# Patient Record
Sex: Female | Born: 1935
Health system: Southern US, Community
[De-identification: ages and names within clinical notes are randomized; demographics above are authoritative.]

## PROBLEM LIST (undated history)

## (undated) DIAGNOSIS — F329 Major depressive disorder, single episode, unspecified: Secondary | ICD-10-CM

## (undated) DIAGNOSIS — F4321 Adjustment disorder with depressed mood: Secondary | ICD-10-CM

## (undated) DIAGNOSIS — M1990 Unspecified osteoarthritis, unspecified site: Secondary | ICD-10-CM

## (undated) DIAGNOSIS — T7840XA Allergy, unspecified, initial encounter: Secondary | ICD-10-CM

## (undated) DIAGNOSIS — E785 Hyperlipidemia, unspecified: Secondary | ICD-10-CM

## (undated) DIAGNOSIS — I4891 Unspecified atrial fibrillation: Secondary | ICD-10-CM

## (undated) DIAGNOSIS — I509 Heart failure, unspecified: Secondary | ICD-10-CM

## (undated) DIAGNOSIS — K219 Gastro-esophageal reflux disease without esophagitis: Secondary | ICD-10-CM

## (undated) DIAGNOSIS — J449 Chronic obstructive pulmonary disease, unspecified: Secondary | ICD-10-CM

## (undated) DIAGNOSIS — I219 Acute myocardial infarction, unspecified: Secondary | ICD-10-CM

## (undated) DIAGNOSIS — F0390 Unspecified dementia without behavioral disturbance: Secondary | ICD-10-CM

## (undated) DIAGNOSIS — I2510 Atherosclerotic heart disease of native coronary artery without angina pectoris: Secondary | ICD-10-CM

## (undated) DIAGNOSIS — N289 Disorder of kidney and ureter, unspecified: Secondary | ICD-10-CM

## (undated) DIAGNOSIS — D649 Anemia, unspecified: Secondary | ICD-10-CM

## (undated) DIAGNOSIS — F0280 Dementia in other diseases classified elsewhere without behavioral disturbance: Secondary | ICD-10-CM

## (undated) DIAGNOSIS — I10 Essential (primary) hypertension: Secondary | ICD-10-CM

## (undated) DIAGNOSIS — F32A Depression, unspecified: Secondary | ICD-10-CM

## (undated) DIAGNOSIS — E559 Vitamin D deficiency, unspecified: Secondary | ICD-10-CM

## (undated) HISTORY — DX: Chronic obstructive pulmonary disease, unspecified: J44.9

## (undated) HISTORY — DX: Depression, unspecified: F32.A

## (undated) HISTORY — DX: Unspecified atrial fibrillation: I48.91

## (undated) HISTORY — DX: Allergy, unspecified, initial encounter: T78.40XA

## (undated) HISTORY — DX: Heart failure, unspecified: I50.9

## (undated) HISTORY — PX: ABDOMINAL HYSTERECTOMY: SHX81

## (undated) HISTORY — PX: FOREARM SURGERY: SHX651

## (undated) HISTORY — DX: Acute myocardial infarction, unspecified: I21.9

## (undated) HISTORY — DX: Adjustment disorder with depressed mood: F43.21

## (undated) HISTORY — DX: Vitamin D deficiency, unspecified: E55.9

## (undated) HISTORY — PX: BREAST SURGERY: SHX581

## (undated) HISTORY — DX: Essential (primary) hypertension: I10

## (undated) HISTORY — DX: Hyperlipidemia, unspecified: E78.5

## (undated) HISTORY — DX: Gastro-esophageal reflux disease without esophagitis: K21.9

## (undated) HISTORY — DX: Unspecified osteoarthritis, unspecified site: M19.90

## (undated) HISTORY — DX: Anemia, unspecified: D64.9

---
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
## 2012-12-19 DIAGNOSIS — E782 Mixed hyperlipidemia: Secondary | ICD-10-CM | POA: Insufficient documentation

## 2012-12-19 DIAGNOSIS — M8000XD Age-related osteoporosis with current pathological fracture, unspecified site, subsequent encounter for fracture with routine healing: Secondary | ICD-10-CM | POA: Insufficient documentation

## 2012-12-19 DIAGNOSIS — I10 Essential (primary) hypertension: Secondary | ICD-10-CM | POA: Insufficient documentation

## 2012-12-19 DIAGNOSIS — J449 Chronic obstructive pulmonary disease, unspecified: Secondary | ICD-10-CM | POA: Insufficient documentation

## 2015-06-20 DIAGNOSIS — M25531 Pain in right wrist: Secondary | ICD-10-CM | POA: Insufficient documentation

## 2015-06-20 HISTORY — DX: Pain in right wrist: M25.531

## 2015-11-10 DIAGNOSIS — R0982 Postnasal drip: Secondary | ICD-10-CM | POA: Insufficient documentation

## 2015-11-10 HISTORY — DX: Postnasal drip: R09.82

## 2016-04-15 DIAGNOSIS — I480 Paroxysmal atrial fibrillation: Secondary | ICD-10-CM | POA: Insufficient documentation

## 2016-05-31 DIAGNOSIS — H2589 Other age-related cataract: Secondary | ICD-10-CM | POA: Insufficient documentation

## 2016-09-06 DIAGNOSIS — E876 Hypokalemia: Secondary | ICD-10-CM

## 2016-09-06 HISTORY — DX: Hypokalemia: E87.6

## 2016-09-06 LAB — LIPID PANEL
Cholesterol: 252 — AB (ref 0–200)
HDL: 46 (ref 35–70)
LDL Cholesterol: 168
Triglycerides: 189 — AB (ref 40–160)

## 2018-02-03 LAB — HEMOGLOBIN A1C: Hemoglobin A1C: 5.5

## 2018-03-18 DIAGNOSIS — D509 Iron deficiency anemia, unspecified: Secondary | ICD-10-CM | POA: Insufficient documentation

## 2018-05-03 DIAGNOSIS — R1319 Other dysphagia: Secondary | ICD-10-CM | POA: Insufficient documentation

## 2018-06-16 LAB — CBC AND DIFFERENTIAL
HCT: 42 (ref 36–46)
Hemoglobin: 13.1 (ref 12.0–16.0)
Platelets: 391 (ref 150–399)
WBC: 7.1

## 2018-06-16 LAB — BASIC METABOLIC PANEL
BUN: 10 (ref 4–21)
CO2: 28 — AB (ref 13–22)
Chloride: 98 — AB (ref 99–108)
Creatinine: 0.9 (ref 0.5–1.1)
Glucose: 99
Potassium: 4.3 (ref 3.4–5.3)
Sodium: 136 — AB (ref 137–147)

## 2018-06-16 LAB — IRON,TIBC AND FERRITIN PANEL
Ferritin: 42
Iron: <10

## 2018-06-16 LAB — TSH: TSH: 0.51 (ref 0.41–5.90)

## 2018-06-16 LAB — HEPATIC FUNCTION PANEL
ALT: 18 (ref 7–35)
AST: 31 (ref 13–35)
Alkaline Phosphatase: 93 (ref 25–125)

## 2018-06-16 LAB — COMPREHENSIVE METABOLIC PANEL: GFR calc non Af Amer: 59

## 2018-06-16 LAB — VITAMIN B12: Vitamin B-12: 569

## 2019-06-11 ENCOUNTER — Ambulatory Visit (INDEPENDENT_AMBULATORY_CARE_PROVIDER_SITE_OTHER): Payer: Medicare HMO | Admitting: Internal Medicine

## 2019-06-11 ENCOUNTER — Encounter: Payer: Self-pay | Admitting: Internal Medicine

## 2019-06-11 ENCOUNTER — Other Ambulatory Visit: Payer: Self-pay

## 2019-06-11 VITALS — BP 123/68 | HR 59 | Resp 16 | Ht 60.0 in | Wt 137.0 lb

## 2019-06-11 DIAGNOSIS — J449 Chronic obstructive pulmonary disease, unspecified: Secondary | ICD-10-CM

## 2019-06-11 DIAGNOSIS — R1319 Other dysphagia: Secondary | ICD-10-CM

## 2019-06-11 DIAGNOSIS — H6123 Impacted cerumen, bilateral: Secondary | ICD-10-CM | POA: Diagnosis not present

## 2019-06-11 DIAGNOSIS — M791 Myalgia, unspecified site: Secondary | ICD-10-CM

## 2019-06-11 DIAGNOSIS — I480 Paroxysmal atrial fibrillation: Secondary | ICD-10-CM

## 2019-06-11 DIAGNOSIS — I10 Essential (primary) hypertension: Secondary | ICD-10-CM

## 2019-06-11 DIAGNOSIS — T466X5A Adverse effect of antihyperlipidemic and antiarteriosclerotic drugs, initial encounter: Secondary | ICD-10-CM | POA: Insufficient documentation

## 2019-06-11 DIAGNOSIS — F322 Major depressive disorder, single episode, severe without psychotic features: Secondary | ICD-10-CM

## 2019-06-11 DIAGNOSIS — I509 Heart failure, unspecified: Secondary | ICD-10-CM

## 2019-06-11 DIAGNOSIS — R1310 Dysphagia, unspecified: Secondary | ICD-10-CM

## 2019-06-11 DIAGNOSIS — M810 Age-related osteoporosis without current pathological fracture: Secondary | ICD-10-CM | POA: Insufficient documentation

## 2019-06-11 MED ORDER — OMEPRAZOLE 20 MG PO CPDR: 20.0000 mg | DELAYED_RELEASE_CAPSULE | Freq: Two times a day (BID) | ORAL | 1 refills | Status: DC

## 2019-06-11 NOTE — Patient Instructions (Signed)
You have been referred to [facility] for a cardiology appointment.

## 2019-06-11 NOTE — Progress Notes (Signed)
Date:  06/11/2019   Name:  Jessica Howell   DOB:  09-20-1934   MRN:  [id number]  New patient here to establish care from [facility]. Her today with her daughter Jessica Howell. Chief Complaint: Establish Care  Ear Fullness  There is pain in both ears. This is a recurrent problem. The problem occurs constantly. The problem has been unchanged. There has been no fever. The patient is experiencing no pain. Associated symptoms include abdominal pain (intermittent indigestion), headaches and hearing loss. Pertinent negatives include no rash.  Hypertension This is a chronic problem. The problem is controlled. Associated symptoms include anxiety, headaches, palpitations and shortness of breath. Pertinent negatives include no chest pain, orthopnea, peripheral edema or PND. Past treatments include beta blockers, ACE inhibitors and diuretics. The current treatment provides significant improvement. Hypertensive end-organ damage includes CAD/MI (and AFib).  Gastroesophageal Reflux She complains of abdominal pain (intermittent indigestion), dysphagia (intermittently feels like food is hanging up and sometimes has to regurgitate), heartburn and wheezing. She reports no chest pain, no choking, no globus sensation or no hoarse voice. This is a recurrent problem. The problem occurs occasionally. Pertinent negatives include no fatigue. She has tried a PPI for the symptoms. Past procedures do not include an EGD (unable to undergo EGD due to health status).  Depression        This is a chronic problem.  The most recent episode lasted 50 years.  The problem is unchanged.  Associated symptoms include hopelessness, insomnia, decreased interest, appetite change and headaches.  Associated symptoms include no fatigue and no suicidal ideas.     The symptoms are aggravated by family issues (recent loss of her husband).  Treatments tried: under care of Psychiatry.  Compliance with treatment is good.  Past medical history includes  anxiety.   COPD - she is an ex smoker.  Has copd but needs to use inhalers. She has chronic intermittent cough.  She does not need oxygen.  She is up to date on her immunizations and had had the first covid vaccine. Atrial fibrillation - this is apparently long standing with controlled rate, anticoagulated on Xarelto.  She has no bleeding issues.  She does not want to return to her [facility] Cardiologist due to a negative experience last visit.  Lab Results  Component Value Date   CREATININE 0.9 06/16/2018   BUN 10 06/16/2018   NA 136 (A) 06/16/2018   K 4.3 06/16/2018   CL 98 (A) 06/16/2018   CO2 28 (A) 06/16/2018   Lab Results  Component Value Date   CHOL 252 (A) 09/06/2016   HDL 46 09/06/2016   LDLCALC 168 09/06/2016   TRIG 189 (A) 09/06/2016   Lab Results  Component Value Date   TSH 0.51 06/16/2018   Lab Results  Component Value Date   HGBA1C 5.5 02/03/2018     Review of Systems  Constitutional: Positive for appetite change and unexpected weight change (30 lbs in the past 6 mo since husband died). Negative for chills, fatigue and fever.  HENT: Positive for hearing loss. Negative for ear pain, hoarse voice, sinus pressure and trouble swallowing.   Eyes: Negative for visual disturbance.  Respiratory: Positive for chest tightness, shortness of breath and wheezing. Negative for choking.   Cardiovascular: Positive for palpitations. Negative for chest pain, orthopnea, leg swelling and PND.  Gastrointestinal: Positive for abdominal pain (intermittent indigestion), dysphagia (intermittently feels like food is hanging up and sometimes has to regurgitate) and heartburn. Negative for anal bleeding.  Genitourinary: Positive for frequency and urgency.  Skin: Negative for color change and rash.  Allergic/Immunologic: Negative for environmental allergies.  Neurological: Positive for headaches. Negative for dizziness and light-headedness.  Psychiatric/Behavioral: Positive for depression and  dysphoric mood. Negative for sleep disturbance and suicidal ideas. The patient is nervous/anxious and has insomnia.     Patient Active Problem List   Diagnosis Date Noted  . Senile osteoporosis 06/11/2019  . Esophageal dysphagia 05/03/2018  . Iron deficiency anemia 03/18/2018  . Other age-related cataract 05/31/2016  . Paroxysmal atrial fibrillation ([place]) 04/15/2016  . CHF (congestive heart failure) ([place]) 10/16/2013  . Other seborrheic keratosis 01/03/2013  . Coronary atherosclerosis of native coronary artery 01/03/2013  . Vitamin D deficiency 12/19/2012  . Mixed hyperlipidemia 12/19/2012  . Essential (primary) hypertension 12/19/2012  . Chronic obstructive pulmonary disease, unspecified ([place]) 12/19/2012    Allergies  Allergen Reactions  . Penicillins Anaphylaxis, Rash and Other (See Comments)  . Azithromycin Other (See Comments)    abd pain   . Gemfibrozil Nausea And Vomiting and Rash       . Solifenacin     Mild urinary retention  . Statins Nausea And Vomiting and Rash    Stomach pain      Past Surgical History:  Procedure Laterality Date  . ABDOMINAL HYSTERECTOMY    . BREAST SURGERY    . FOREARM SURGERY      Social History   Tobacco Use  . Smoking status: Former Smoker    Packs/day: 0.50    Years: 45.00    Pack years: 22.50    Types: Cigarettes    Quit date: 06/10/2004    Years since quitting: 15.0  . Smokeless tobacco: Never Used  Substance Use Topics  . Alcohol use: Not Currently  . Drug use: Not Currently     Medication list has been reviewed and updated.  Current Meds  Medication Sig  . albuterol (VENTOLIN HFA) 108 (90 Base) MCG/ACT inhaler ventolin hfa 108 (90 base) mcg/actaers  . ALPRAZolam (XANAX) 0.5 MG tablet Take 0.5 mg by mouth 3 (three) times daily as needed.   . Cholecalciferol (VITAMIN D) 125 MCG (5000 UT) CAPS Take by mouth.  . cloNIDine (CATAPRES - DOSED IN MG/24 HR) 0.2 mg/24hr patch Place 0.2 mg onto the skin once a week.  .  Fluticasone-Salmeterol (ADVAIR DISKUS) 250-50 MCG/DOSE AEPB Inhale into the lungs.  . furosemide (LASIX) 20 MG tablet 40 mg.   . Melatonin 5 MG CHEW Chew 10 mg by mouth.   . Metoprolol Succinate 200 MG CS24 Take 200 mg by mouth daily.  . montelukast (SINGULAIR) 10 MG tablet Take by mouth at bedtime.   . nitroGLYCERIN (NITROSTAT) 0.4 MG SL tablet Nitrostat 0.4 mg sublingual tablet  PLACE 1 T UNDER THE TONGUE Q 5 MINUTES PRF CHEST PAIN  . omeprazole (PRILOSEC) 20 MG capsule omeprazole 20 mg capsule,delayed release  TK ONE C PO QD  . potassium chloride (KLOR-CON) 10 MEQ tablet   . ramipril (ALTACE) 10 MG capsule Take 10 mg by mouth 2 (two) times daily.   . Rivaroxaban (XARELTO) 15 MG TABS tablet Take by mouth.  . temazepam (RESTORIL) 30 MG capsule temazepam 30 mg caps  . traMADol (ULTRAM) 50 MG tablet Take by mouth every 6 (six) hours as needed.  . vitamin E 180 MG (400 UNITS) capsule Take by mouth.  . [DISCONTINUED] aspirin 81 MG EC tablet Take by mouth.  . [DISCONTINUED] Cholecalciferol 25 MCG (1000 UT) capsule Take by mouth.  . [  DISCONTINUED] diltiazem (TIAZAC) 240 MG 24 hr capsule Take by mouth.  . [DISCONTINUED] Fluticasone Furoate (ARNUITY ELLIPTA) 100 MCG/ACT AEPB   . [DISCONTINUED] metoprolol (TOPROL-XL) 200 MG 24 hr tablet Take by mouth.  . [DISCONTINUED] NIFEdipine (PROCARDIA XL/NIFEDICAL XL) 60 MG 24 hr tablet Nifedical XL 60 mg tablet,extended release  . [DISCONTINUED] tobramycin-dexamethasone (TOBRADEX) ophthalmic solution Apply to eye.    PHQ 2/9 Scores 06/11/2019  PHQ - 2 Score 6  PHQ- 9 Score 23    BP Readings from Last 3 Encounters:  06/11/19 123/68    Physical Exam Vitals and nursing note reviewed.  Constitutional:      General: She is not in acute distress.    Appearance: Normal appearance. She is well-developed.  HENT:     Head: Normocephalic and atraumatic.     Right Ear: Tympanic membrane and ear canal normal. Decreased hearing noted. There is impacted  cerumen.     Left Ear: Tympanic membrane and ear canal normal. Decreased hearing noted. There is impacted cerumen.     Ears:     Comments: Cerumen was removed from both ear canals using a curette.  Pt tolerated procedure well.  Afterwards, both canals and drums appear normal. Neck:     Vascular: No carotid bruit or JVD.  Cardiovascular:     Rate and Rhythm: Normal rate. Rhythm irregular.     Pulses: Normal pulses.     Heart sounds: No murmur.  Pulmonary:     Effort: Pulmonary effort is normal. No respiratory distress.     Breath sounds: No wheezing or rhonchi.  Abdominal:     General: Abdomen is flat.     Palpations: Abdomen is soft.     Tenderness: There is no abdominal tenderness.  Musculoskeletal:        General: Normal range of motion.     Cervical back: Normal range of motion.     Right lower leg: No edema.     Left lower leg: No edema.  Lymphadenopathy:     Cervical: No cervical adenopathy.  Skin:    General: Skin is warm and dry.     Capillary Refill: Capillary refill takes less than 2 seconds.     Findings: No rash.  Neurological:     General: No focal deficit present.     Mental Status: She is alert and oriented to person, place, and time.  Psychiatric:        Attention and Perception: Attention normal.        Mood and Affect: Mood is depressed. Affect is not flat or tearful.        Speech: Speech normal.        Behavior: Behavior normal.        Thought Content: Thought content includes suicidal (has negative thoughts - under Psych care monthly for many years) ideation. Thought content does not include suicidal plan.        Cognition and Memory: Cognition normal.     Wt Readings from Last 3 Encounters:  06/11/19 137 lb (62.1 kg)    BP 123/68   Pulse (!) 59   Resp 16   Ht 5' (1.524 m)   Wt 137 lb (62.1 kg)   SpO2 97%   BMI 26.76 kg/m   Assessment and Plan: 1. Essential (primary) hypertension Clinically stable exam with well controlled BP on lisinopril,  metoprolol and lasix. Tolerating medications without side effects at this time. Pt to continue current regimen and low sodium diet; benefits of regular  exercise as able discussed. - TSH  2. Paroxysmal atrial fibrillation (HCC) Rate controlled on metoprolol, anticoagulated on Xarelto Will refer to a [facility] - CBC with Differential/Platelet - Comprehensive metabolic panel - Ambulatory referral to Cardiology  3. Congestive heart failure, unspecified HF chronicity, unspecified heart failure type [facility]) She appears eu-volemic today.  No PND or edema. No JVD on exam. - Ambulatory referral to Cardiology  4. Chronic obstructive pulmonary disease, unspecified COPD type ([place]) Hx of smoking - quite 15+ years ago Symptoms are well controlled on Advair bid and albuterol PRN  5. Esophageal dysphagia Unable to tolerated EGD for further workup.  Dysphagia sx are stable and intermittent but she still has some reflux symptoms Will increase to omeprazole BID - omeprazole (PRILOSEC) 20 MG capsule; Take 1 capsule (20 mg total) by mouth 2 (two) times daily before a meal.  Dispense: 180 capsule; Refill: 1  6. Myalgia due to statin Patient has not tolerated statins in the past due to myalgias  7. Impacted cerumen of both ears Moderated amount of cerumen was removed from both ears with a curette.  Pt believes that her hearing has improved slightly. Still appears to have reduced acuity.    8. Current severe episode of major depressive disorder without psychotic features, unspecified whether recurrent ([place]) Has been under psych care for many years Currently seeing her provider monthly On Tramadol, xanax and Temazepam   Partially dictated using Editor, commissioning. Any errors are unintentional.  Halina Maidens, MD [facility]  06/11/2019

## 2019-06-12 LAB — COMPREHENSIVE METABOLIC PANEL
ALT: 17 IU/L (ref 0–32)
AST: 24 IU/L (ref 0–40)
Albumin/Globulin Ratio: 1.6 (ref 1.2–2.2)
Albumin: 4.6 g/dL (ref 3.6–4.6)
Alkaline Phosphatase: 100 IU/L (ref 39–117)
BUN/Creatinine Ratio: 15 (ref 12–28)
BUN: 19 mg/dL (ref 8–27)
Bilirubin Total: 0.4 mg/dL (ref 0.0–1.2)
CO2: 25 mmol/L (ref 20–29)
Calcium: 10.8 mg/dL — ABNORMAL HIGH (ref 8.7–10.3)
Chloride: 95 mmol/L — ABNORMAL LOW (ref 96–106)
Creatinine, Ser: 1.23 mg/dL — ABNORMAL HIGH (ref 0.57–1.00)
GFR calc Af Amer: 47 mL/min/{1.73_m2} — ABNORMAL LOW (ref >59)
GFR calc non Af Amer: 41 mL/min/{1.73_m2} — ABNORMAL LOW (ref >59)
Globulin, Total: 2.8 g/dL (ref 1.5–4.5)
Glucose: 95 mg/dL (ref 65–99)
Potassium: 4.3 mmol/L (ref 3.5–5.2)
Sodium: 134 mmol/L (ref 134–144)
Total Protein: 7.4 g/dL (ref 6.0–8.5)

## 2019-06-12 LAB — TSH: TSH: 2.74 u[IU]/mL (ref 0.450–4.500)

## 2019-06-12 LAB — CBC WITH DIFFERENTIAL/PLATELET
Basophils Absolute: 0.1 10*3/uL (ref 0.0–0.2)
Basos: 1 %
EOS (ABSOLUTE): 0 10*3/uL (ref 0.0–0.4)
Eos: 0 %
Hematocrit: 43.9 % (ref 34.0–46.6)
Hemoglobin: 14.6 g/dL (ref 11.1–15.9)
Immature Grans (Abs): 0 10*3/uL (ref 0.0–0.1)
Immature Granulocytes: 0 %
Lymphocytes Absolute: 2.6 10*3/uL (ref 0.7–3.1)
Lymphs: 33 %
MCH: 27 pg (ref 26.6–33.0)
MCHC: 33.3 g/dL (ref 31.5–35.7)
MCV: 81 fL (ref 79–97)
Monocytes Absolute: 0.9 10*3/uL (ref 0.1–0.9)
Monocytes: 11 %
Neutrophils Absolute: 4.3 10*3/uL (ref 1.4–7.0)
Neutrophils: 55 %
Platelets: 149 10*3/uL — ABNORMAL LOW (ref 150–450)
RBC: 5.4 x10E6/uL — ABNORMAL HIGH (ref 3.77–5.28)
RDW: 13.1 % (ref 11.7–15.4)
WBC: 7.9 10*3/uL (ref 3.4–10.8)

## 2019-06-18 ENCOUNTER — Ambulatory Visit: Payer: Medicare HMO | Admitting: Cardiology

## 2019-06-20 ENCOUNTER — Encounter: Payer: Self-pay | Admitting: Cardiology

## 2019-07-10 ENCOUNTER — Telehealth: Payer: Self-pay | Admitting: Cardiology

## 2019-07-10 NOTE — Telephone Encounter (Signed)
Patient mailed in a check for a no show fee for $50.00. Patient was scheduled as a New patient and we do not charge a fee for those missed appointments. Patient called and made aware. Patient asked that I mail back her check   Patient will check with her daughter and will call back to reschedule new patient appointment

## 2019-08-30 ENCOUNTER — Other Ambulatory Visit: Payer: Self-pay | Admitting: Internal Medicine

## 2019-08-30 DIAGNOSIS — R1319 Other dysphagia: Secondary | ICD-10-CM

## 2019-08-30 DIAGNOSIS — R1310 Dysphagia, unspecified: Secondary | ICD-10-CM

## 2019-08-30 NOTE — Telephone Encounter (Signed)
Requested Prescriptions  Pending Prescriptions Disp Refills  . omeprazole (PRILOSEC) 20 MG capsule [Pharmacy Med Name: OMEPRAZOLE 20 MG Capsule Delayed Release] 180 capsule 1    Sig: TAKE 1 CAPSULE (20 MG TOTAL) BY MOUTH 2 (TWO) TIMES DAILY BEFORE A MEAL.     Gastroenterology: Proton Pump Inhibitors Passed - 08/30/2019  4:46 PM      Passed - Valid encounter within last 12 months    Recent Outpatient Visits          2 months ago Essential (primary) hypertension   [facility] Glean Hess, MD      Future Appointments            In 1 month Army Melia, Jesse Sans, MD [facility], [facility]

## 2019-09-17 ENCOUNTER — Ambulatory Visit: Payer: Medicare HMO

## 2019-09-26 ENCOUNTER — Ambulatory Visit (INDEPENDENT_AMBULATORY_CARE_PROVIDER_SITE_OTHER): Payer: Medicare HMO

## 2019-09-26 DIAGNOSIS — Z0000 Encounter for general adult medical examination without abnormal findings: Secondary | ICD-10-CM

## 2019-09-26 NOTE — Progress Notes (Signed)
Subjective:   Jessica Howell is a 85 y.o. female who presents for an Initial Medicare Annual Wellness Visit.  Virtual Visit via Telephone Note  I connected with  Jessica Howell on 09/26/19 at  3:20 PM EDT by telephone and verified that I am speaking with the correct person using two identifiers.  Medicare Annual Wellness visit completed telephonically due to Covid-19 pandemic.   Location: Patient: home Provider: office   I discussed the limitations, risks, security and privacy concerns of performing an evaluation and management service by telephone and the availability of in person appointments. The patient expressed understanding and agreed to proceed.  Unable to perform video visit due to patient does not have video capability.   Some vital signs may be absent or patient reported.   Clemetine Marker, LPN    Review of Systems      Cardiac Risk Factors include: advanced age (>71men, >75 women);hypertension;sedentary lifestyle     Objective:    There were no vitals filed for this visit. There is no height or weight on file to calculate BMI.  Advanced Directives 09/26/2019  Does Patient Have a Medical Advance Directive? Yes  Type of Paramedic of [place];Living will  Copy of [place] in Chart? Yes - validated most recent copy scanned in chart (See row information)    Current Medications (verified) Outpatient Encounter Medications as of 09/26/2019  Medication Sig  . albuterol (VENTOLIN HFA) 108 (90 Base) MCG/ACT inhaler ventolin hfa 108 (90 base) mcg/actaers  . ALPRAZolam (XANAX) 0.5 MG tablet Take 0.5 mg by mouth 3 (three) times daily as needed.   . Cholecalciferol (VITAMIN D) 125 MCG (5000 UT) CAPS Take by mouth.  . furosemide (LASIX) 40 MG tablet Take 1 tablet by mouth in the morning and at bedtime.  . Melatonin 5 MG CHEW Chew 10 mg by mouth.   . Metoprolol Succinate 200 MG CS24 Take 200 mg by mouth daily.  . montelukast  (SINGULAIR) 10 MG tablet Take by mouth at bedtime.   . nitroGLYCERIN (NITROSTAT) 0.4 MG SL tablet Nitrostat 0.4 mg sublingual tablet  PLACE 1 T UNDER THE TONGUE Q 5 MINUTES PRF CHEST PAIN  . omeprazole (PRILOSEC) 20 MG capsule TAKE 1 CAPSULE (20 MG TOTAL) BY MOUTH 2 (TWO) TIMES DAILY BEFORE A MEAL.  Marland Kitchen potassium chloride (KLOR-CON) 10 MEQ tablet   . ramipril (ALTACE) 10 MG capsule Take 10 mg by mouth 2 (two) times daily.   . temazepam (RESTORIL) 30 MG capsule temazepam 30 mg caps  . traMADol (ULTRAM) 50 MG tablet Take by mouth every 6 (six) hours as needed.  . vitamin E 180 MG (400 UNITS) capsule Take by mouth.  . Fluticasone-Salmeterol (ADVAIR DISKUS) 250-50 MCG/DOSE AEPB Inhale into the lungs.  . Rivaroxaban (XARELTO) 15 MG TABS tablet Take by mouth.  . [DISCONTINUED] cloNIDine (CATAPRES - DOSED IN MG/24 HR) 0.2 mg/24hr patch Place 0.2 mg onto the skin once a week.  . [DISCONTINUED] furosemide (LASIX) 20 MG tablet 40 mg.    No facility-administered encounter medications on file as of 09/26/2019.    Allergies (verified) Penicillins, Azithromycin, Gemfibrozil, Solifenacin, and Statins   History: Past Medical History:  Diagnosis Date  . A-fib ([place])   . Allergy   . CHF (congestive heart failure) ([place])   . COPD (chronic obstructive pulmonary disease) ([place])   . Depression   . Feeling grief   . GERD (gastroesophageal reflux disease)   . Hyperlipidemia   . Hypertension   .  Osteoarthritis   . Vitamin D deficiency    Past Surgical History:  Procedure Laterality Date  . ABDOMINAL HYSTERECTOMY    . BREAST SURGERY    . FOREARM SURGERY     Family History  Problem Relation Age of Onset  . Diabetes Mother   . Hypertension Mother   . Cancer Father        esoph.   Marland Kitchen COPD Sister   . Heart disease Brother   . Early death Son    Social History   Socioeconomic History  . Marital status: Widowed    Spouse name: Not on file  . Number of children: 1  . Years of education: Not on file    . Highest education level: Not on file  Occupational History  . Occupation: retired  Tobacco Use  . Smoking status: Former Smoker    Packs/day: 0.50    Years: 45.00    Pack years: 22.50    Types: Cigarettes    Quit date: 06/10/2004    Years since quitting: 15.3  . Smokeless tobacco: Never Used  Substance and Sexual Activity  . Alcohol use: Not Currently  . Drug use: Not Currently  . Sexual activity: Not on file  Other Topics Concern  . Not on file  Social History Narrative   Living alone. Husband passed away in 11-25-2018. Daughter checking in on her and great grandson checks in also.    Social Determinants of Health   Financial Resource Strain: Low Risk   . Difficulty of Paying Living Expenses: Not very hard  Food Insecurity: No Food Insecurity  . Worried About Charity fundraiser in the Last Year: Never true  . Ran Out of Food in the Last Year: Never true  Transportation Needs: No Transportation Needs  . Lack of Transportation (Medical): No  . Lack of Transportation (Non-Medical): No  Physical Activity: Inactive  . Days of Exercise per Week: 0 days  . Minutes of Exercise per Session: 0 min  Stress: Stress Concern Present  . Feeling of Stress : Very much  Social Connections: Unknown  . Frequency of Communication with Friends and Family: Patient refused  . Frequency of Social Gatherings with Friends and Family: Patient refused  . Attends Religious Services: Patient refused  . Active Member of Clubs or Organizations: Patient refused  . Attends Archivist Meetings: Patient refused  . Marital Status: Widowed    Tobacco Counseling Counseling given: Not Answered   Clinical Intake:  Pre-visit preparation completed: Yes  Pain : No/denies pain     Nutritional Risks: None Diabetes: No  How often do you need to have someone help you when you read instructions, pamphlets, or other written materials from your doctor or pharmacy?: 1 - Never  Interpreter  Needed?: No  Information entered by :: Clemetine Marker LPN   Activities of Daily Living In your present state of health, do you have any difficulty performing the following activities: 09/26/2019 06/11/2019  Hearing? Y Y  Comment plans to get hearing assessment -  Vision? Y N  Difficulty concentrating or making decisions? Tempie Donning  Walking or climbing stairs? N Y  Dressing or bathing? N N  Doing errands, shopping? N Y  Conservation officer, nature and eating ? N -  Using the Toilet? N -  In the past six months, have you accidently leaked urine? N -  Do you have problems with loss of bowel control? N -  Managing your Medications? N -  Managing  your Finances? N -  Housekeeping or managing your Housekeeping? N -     Immunizations and Health Maintenance Immunization History  Administered Date(s) Administered  . Influenza-Unspecified 02/16/2017, 01/10/2019  . Moderna SARS-COVID-2 Vaccination 06/06/2019, 07/04/2019  . Pneumococcal Conjugate-13 09/07/2013  . Pneumococcal Polysaccharide-23 03/04/1999, 12/14/2010  . Tdap 12/14/2010   There are no preventive care reminders to display for this patient.  Patient Care Team: Glean Hess, MD as PCP - General (Internal Medicine) Dr. Pasty Arch (Psychiatry)  Indicate any recent Medical Services you may have received from other than [facility] providers in the past year (date may be approximate).     Assessment:   This is a routine wellness examination for [place].  Hearing/Vision screen  Hearing Screening   125Hz  250Hz  500Hz  1000Hz  2000Hz  3000Hz  4000Hz  6000Hz  8000Hz   Right ear:           Left ear:           Comments: Pt c/o difficulty hearing, plans to get hearing evaluation to assess for hearing aids   Vision Screening Comments: Past due for eye exam  Dietary issues and exercise activities discussed: Current Exercise Habits: The patient does not participate in regular exercise at present, Exercise limited by: respiratory conditions(s)  Goals    None    Depression Screen PHQ 2/9 Scores 09/26/2019 06/11/2019  PHQ - 2 Score 6 6  PHQ- 9 Score 18 23    Fall Risk Fall Risk  09/26/2019 06/11/2019  Falls in the past year? 1 1  Number falls in past yr: 1 1  Injury with Fall? 1 0  Risk for fall due to : History of fall(s) History of fall(s)  Follow up Falls prevention discussed Falls evaluation completed    [place]:  Any stairs in or around the home? Yes  If so, are there any without handrails? Yes   Home free of loose throw rugs in walkways, pet beds, electrical cords, etc? Yes  Adequate lighting in your home to reduce risk of falls? Yes   ASSISTIVE DEVICES UTILIZED TO PREVENT FALLS:   Life alert? Yes  Use of a cane, walker or w/c? Yes  Grab bars in the bathroom? Yes  Shower chair or bench in shower? No  Elevated toilet seat or a handicapped toilet? Yes   DME ORDERS:  DME order needed?  No   TIMED UP AND GO:  Was the test performed? No . telephonic visit  Education: Fall risk prevention has been discussed.  Intervention(s) required? No    DME/home health order needed?  No   Cognitive Function:        Screening Tests Health Maintenance  Topic Date Due  . DEXA SCAN  04/26/2020 (Originally 04/02/2002)  . INFLUENZA VACCINE  11/25/2019  . TETANUS/TDAP  12/13/2020  . COVID-19 Vaccine  Completed  . PNA vac Low Risk Adult  Completed    Qualifies for Shingles Vaccine? Yes . Due for Shingrix. Education has been provided regarding the importance of this vaccine. Pt has been advised to call insurance company to determine out of pocket expense. Advised may also receive vaccine at local pharmacy or Health Dept. Verbalized acceptance and understanding.  Tdap: Up to date  Flu Vaccine: Up to date  Pneumococcal Vaccine: Up to date  Covid-19 Vaccine: Up to date   Cancer Screenings:  Colorectal Screening: Not completed .No longer required.   Mammogram:  No longer required.    Bone Density: No longer required  Lung Cancer Screening: (Low Dose CT Chest recommended if Age 22-80 years, 30 pack-year currently smoking OR have quit w/in 15years.) does not qualify.   Additional Screening:  Hepatitis C Screening: no longer required  Vision Screening: Recommended annual ophthalmology exams for early detection of glaucoma and other disorders of the eye. Is the patient up to date with their annual eye exam?  No  Who is the provider or what is the name of the office in which the pt attends annual eye exams? Not established  Dental Screening: Recommended annual dental exams for proper oral hygiene  Community Resource Referral:  CRR required this visit?  No      Plan:    I have personally reviewed and addressed the Medicare Annual Wellness questionnaire and have noted the following in the patient's chart:  A. Medical and social history B. Use of alcohol, tobacco or illicit drugs  C. Current medications and supplements D. Functional ability and status E.  Nutritional status F.  Physical activity G. Advance directives H. List of other physicians I.  Hospitalizations, surgeries, and ER visits in previous 12 months J.  [place] such as hearing and vision if needed, cognitive and depression L. Referrals and appointments   In addition, I have reviewed and discussed with patient certain preventive protocols, quality metrics, and best practice recommendations. A written personalized care plan for preventive services as well as general preventive health recommendations were provided to patient.   Signed,  Clemetine Marker, LPN Nurse Health Advisor   Nurse Notes: none

## 2019-09-26 NOTE — Patient Instructions (Signed)
Ms. Jessica Howell , Thank you for taking time to come for your Medicare Wellness Visit. I appreciate your ongoing commitment to your health goals. Please review the following plan we discussed and let me know if I can assist you in the future.   Screening recommendations/referrals: Colonoscopy: no longer required Mammogram: no longer required Bone Density: no longer required Recommended yearly ophthalmology/optometry visit for glaucoma screening and checkup Recommended yearly dental visit for hygiene and checkup  Vaccinations: Influenza vaccine: done 01/10/19 Pneumococcal vaccine: done 09/07/13 Tdap vaccine: done 12/14/10 Shingles vaccine: Shingrix discussed. Please contact your pharmacy for coverage information.  Covid-19:done 06/06/19 & 07/04/19  Conditions/risks identified: recommend eating 3 healthy meals per day  Next appointment: Follow up in one year for your annual wellness visit    Preventive Care 65 Years and Older, Female Preventive care refers to lifestyle choices and visits with your health care provider that can promote health and wellness. What does preventive care include?  A yearly physical exam. This is also called an annual well check.  Dental exams once or twice a year.  Routine eye exams. Ask your health care provider how often you should have your eyes checked.  Personal lifestyle choices, including:  Daily care of your teeth and gums.  Regular physical activity.  Eating a healthy diet.  Avoiding tobacco and drug use.  Limiting alcohol use.  Practicing safe sex.  Taking low-dose aspirin every day.  Taking vitamin and mineral supplements as recommended by your health care provider. What happens during an annual well check? The services and screenings done by your health care provider during your annual well check will depend on your age, overall health, lifestyle risk factors, and family history of disease. Counseling  Your health care provider may ask you  questions about your:  Alcohol use.  Tobacco use.  Drug use.  Emotional well-being.  Home and relationship well-being.  Sexual activity.  Eating habits.  History of falls.  Memory and ability to understand (cognition).  Work and work Statistician.  Reproductive health. Screening  You may have the following tests or measurements:  Height, weight, and BMI.  Blood pressure.  Lipid and cholesterol levels. These may be checked every 5 years, or more frequently if you are over 89 years old.  Skin check.  Lung cancer screening. You may have this screening every year starting at age 106 if you have a 30-pack-year history of smoking and currently smoke or have quit within the past 15 years.  Fecal occult blood test (FOBT) of the stool. You may have this test every year starting at age 67.  Flexible sigmoidoscopy or colonoscopy. You may have a sigmoidoscopy every 5 years or a colonoscopy every 10 years starting at age 69.  Hepatitis C blood test.  Hepatitis B blood test.  Sexually transmitted disease (STD) testing.  Diabetes screening. This is done by checking your blood sugar (glucose) after you have not eaten for a while (fasting). You may have this done every 1-3 years.  Bone density scan. This is done to screen for osteoporosis. You may have this done starting at age 53.  Mammogram. This may be done every 1-2 years. Talk to your health care provider about how often you should have regular mammograms. Talk with your health care provider about your test results, treatment options, and if necessary, the need for more tests. Vaccines  Your health care provider may recommend certain vaccines, such as:  Influenza vaccine. This is recommended every year.  Tetanus, diphtheria,  and acellular pertussis (Tdap, Td) vaccine. You may need a Td booster every 10 years.  Zoster vaccine. You may need this after age 34.  Pneumococcal 13-valent conjugate (PCV13) vaccine. One dose is  recommended after age 61.  Pneumococcal polysaccharide (PPSV23) vaccine. One dose is recommended after age 37. Talk to your health care provider about which screenings and vaccines you need and how often you need them. This information is not intended to replace advice given to you by your health care provider. Make sure you discuss any questions you have with your health care provider. Document Released: 05/09/2015 Document Revised: 12/31/2015 Document Reviewed: 02/11/2015 Elsevier Interactive Patient Education  2017 [place] Prevention in the Home Falls can cause injuries. They can happen to people of all ages. There are many things you can do to make your home safe and to help prevent falls. What can I do on the outside of my home?  Regularly fix the edges of walkways and driveways and fix any cracks.  Remove anything that might make you trip as you walk through a door, such as a raised step or threshold.  Trim any bushes or trees on the path to your home.  Use bright outdoor lighting.  Clear any walking paths of anything that might make someone trip, such as rocks or tools.  Regularly check to see if handrails are loose or broken. Make sure that both sides of any steps have handrails.  Any raised decks and porches should have guardrails on the edges.  Have any leaves, snow, or ice cleared regularly.  Use sand or salt on walking paths during winter.  Clean up any spills in your garage right away. This includes oil or grease spills. What can I do in the bathroom?  Use night lights.  Install grab bars by the toilet and in the tub and shower. Do not use towel bars as grab bars.  Use non-skid mats or decals in the tub or shower.  If you need to sit down in the shower, use a plastic, non-slip stool.  Keep the floor dry. Clean up any water that spills on the floor as soon as it happens.  Remove soap buildup in the tub or shower regularly.  Attach bath mats  securely with double-sided non-slip rug tape.  Do not have throw rugs and other things on the floor that can make you trip. What can I do in the bedroom?  Use night lights.  Make sure that you have a light by your bed that is easy to reach.  Do not use any sheets or blankets that are too big for your bed. They should not hang down onto the floor.  Have a firm chair that has side arms. You can use this for support while you get dressed.  Do not have throw rugs and other things on the floor that can make you trip. What can I do in the kitchen?  Clean up any spills right away.  Avoid walking on wet floors.  Keep items that you use a lot in easy-to-reach places.  If you need to reach something above you, use a strong step stool that has a grab bar.  Keep electrical cords out of the way.  Do not use floor polish or wax that makes floors slippery. If you must use wax, use non-skid floor wax.  Do not have throw rugs and other things on the floor that can make you trip. What can I do with my  stairs?  Do not leave any items on the stairs.  Make sure that there are handrails on both sides of the stairs and use them. Fix handrails that are broken or loose. Make sure that handrails are as long as the stairways.  Check any carpeting to make sure that it is firmly attached to the stairs. Fix any carpet that is loose or worn.  Avoid having throw rugs at the top or bottom of the stairs. If you do have throw rugs, attach them to the floor with carpet tape.  Make sure that you have a light switch at the top of the stairs and the bottom of the stairs. If you do not have them, ask someone to add them for you. What else can I do to help prevent falls?  Wear shoes that:  Do not have high heels.  Have rubber bottoms.  Are comfortable and fit you well.  Are closed at the toe. Do not wear sandals.  If you use a stepladder:  Make sure that it is fully opened. Do not climb a closed  stepladder.  Make sure that both sides of the stepladder are locked into place.  Ask someone to hold it for you, if possible.  Clearly mark and make sure that you can see:  Any grab bars or handrails.  First and last steps.  Where the edge of each step is.  Use tools that help you move around (mobility aids) if they are needed. These include:  Canes.  Walkers.  Scooters.  Crutches.  Turn on the lights when you go into a dark area. Replace any light bulbs as soon as they burn out.  Set up your furniture so you have a clear path. Avoid moving your furniture around.  If any of your floors are uneven, fix them.  If there are any pets around you, be aware of where they are.  Review your medicines with your doctor. Some medicines can make you feel dizzy. This can increase your chance of falling. Ask your doctor what other things that you can do to help prevent falls. This information is not intended to replace advice given to you by your health care provider. Make sure you discuss any questions you have with your health care provider. Document Released: 02/06/2009 Document Revised: 09/18/2015 Document Reviewed: 05/17/2014 Elsevier Interactive Patient Education  2017 [employer].

## 2019-10-09 ENCOUNTER — Encounter: Payer: Self-pay | Admitting: Internal Medicine

## 2019-10-09 ENCOUNTER — Ambulatory Visit (INDEPENDENT_AMBULATORY_CARE_PROVIDER_SITE_OTHER): Payer: Medicare HMO | Admitting: Internal Medicine

## 2019-10-09 ENCOUNTER — Other Ambulatory Visit: Payer: Self-pay

## 2019-10-09 VITALS — BP 136/84 | HR 73 | Temp 98.0°F | Ht 60.0 in | Wt 140.0 lb

## 2019-10-09 DIAGNOSIS — E559 Vitamin D deficiency, unspecified: Secondary | ICD-10-CM | POA: Diagnosis not present

## 2019-10-09 DIAGNOSIS — D6869 Other thrombophilia: Secondary | ICD-10-CM | POA: Insufficient documentation

## 2019-10-09 DIAGNOSIS — Z0000 Encounter for general adult medical examination without abnormal findings: Secondary | ICD-10-CM

## 2019-10-09 DIAGNOSIS — I10 Essential (primary) hypertension: Secondary | ICD-10-CM

## 2019-10-09 DIAGNOSIS — J449 Chronic obstructive pulmonary disease, unspecified: Secondary | ICD-10-CM | POA: Diagnosis not present

## 2019-10-09 DIAGNOSIS — I480 Paroxysmal atrial fibrillation: Secondary | ICD-10-CM

## 2019-10-09 DIAGNOSIS — F322 Major depressive disorder, single episode, severe without psychotic features: Secondary | ICD-10-CM

## 2019-10-09 DIAGNOSIS — I700 Atherosclerosis of aorta: Secondary | ICD-10-CM

## 2019-10-09 DIAGNOSIS — R1319 Other dysphagia: Secondary | ICD-10-CM

## 2019-10-09 DIAGNOSIS — R1310 Dysphagia, unspecified: Secondary | ICD-10-CM

## 2019-10-09 DIAGNOSIS — H9193 Unspecified hearing loss, bilateral: Secondary | ICD-10-CM

## 2019-10-09 LAB — POCT URINALYSIS DIPSTICK
Bilirubin, UA: NEGATIVE
Blood, UA: NEGATIVE
Glucose, UA: NEGATIVE
Ketones, UA: NEGATIVE
Nitrite, UA: NEGATIVE
Protein, UA: NEGATIVE
Spec Grav, UA: <=1.005 — AB (ref 1.010–1.025)
Urobilinogen, UA: 0.2 E.U./dL
pH, UA: 7 (ref 5.0–8.0)

## 2019-10-09 MED ORDER — NITROGLYCERIN 0.4 MG SL SUBL: 0.4000 mg | SUBLINGUAL_TABLET | SUBLINGUAL | 1 refills | Status: DC | PRN

## 2019-10-09 NOTE — Progress Notes (Signed)
Date:  10/09/2019   Name:  Jessica Howell   DOB:  Apr 13, 1935   MRN:  [id number]   Chief Complaint: Annual Exam (no breast exam/ no pap. Patient is here today with Jessica Howell.) Jessica Howell is a 85 y.o. female who presents today for her Complete Annual Exam. She feels fairly well. She reports exercising - no excercise. She reports she is sleeping poorly - using Temazepam.  Mammogram aged out DEXA  03/2013 Colonoscopy aged out Immunization History  Administered Date(s) Administered  . Influenza-Unspecified 02/16/2017, 01/10/2019  . Moderna SARS-COVID-2 Vaccination 06/06/2019, 07/04/2019  . Pneumococcal Conjugate-13 09/07/2013  . Pneumococcal Polysaccharide-23 03/04/1999, 12/14/2010  . Tdap 12/14/2010    Hypertension Associated symptoms include chest pain (yesterday took nitrogly.) and shortness of breath (daily). Pertinent negatives include no headaches. Past treatments include ACE inhibitors, beta blockers and direct vasodilators. The current treatment provides significant improvement. There are no compliance problems.  Hypertensive end-organ damage includes CAD/MI.  Gastroesophageal Reflux She complains of abdominal pain, chest pain (yesterday took nitrogly.), dysphagia and heartburn. This is a chronic problem. The problem occurs occasionally. Associated symptoms include fatigue (tired daily). She has tried a PPI (dose doubled last visit) for the symptoms. The treatment provided moderate relief.  Depression        This is a chronic (followed by Psych) problem.The problem is unchanged.  Associated symptoms include fatigue (tired daily).  Associated symptoms include no headaches.  Treatments tried: Psych is treating her with temazepam, xanax and tramadol.  Compliance with treatment is good.  Improvement on treatment: PHQ-9 scores remain very high. ASCVD - has aortic atherosclerosis, Afib and CAD.  Previously seen at [facility]  Referred last visit to [facility] but no showed for her  visit.  She is reported intolerant of statins.  She continues on Xarelto for chronic Afib.  She reports some chest pain, substernal, recently which occurred at rest.  She apparently took several NTG with minimal benefit and then the pain resolved.  She is reluctant to establish with a new cardiologist.  Lab Results  Component Value Date   CREATININE 1.23 (H) 06/11/2019   BUN 19 06/11/2019   NA 134 06/11/2019   K 4.3 06/11/2019   CL 95 (L) 06/11/2019   CO2 25 06/11/2019   Lab Results  Component Value Date   CHOL 252 (A) 09/06/2016   HDL 46 09/06/2016   LDLCALC 168 09/06/2016   TRIG 189 (A) 09/06/2016   Lab Results  Component Value Date   TSH 2.740 06/11/2019   Lab Results  Component Value Date   HGBA1C 5.5 02/03/2018   Lab Results  Component Value Date   WBC 7.9 06/11/2019   HGB 14.6 06/11/2019   HCT 43.9 06/11/2019   MCV 81 06/11/2019   PLT 149 (L) 06/11/2019   Lab Results  Component Value Date   ALT 17 06/11/2019   AST 24 06/11/2019   ALKPHOS 100 06/11/2019   BILITOT 0.4 06/11/2019     Review of Systems  Constitutional: Positive for chills (daily) and fatigue (tired daily).  HENT: Positive for hearing loss and trouble swallowing. Negative for congestion and sinus pressure.   Eyes: Positive for visual disturbance.  Respiratory: Positive for shortness of breath (daily).   Cardiovascular: Positive for chest pain (yesterday took nitrogly.).  Gastrointestinal: Positive for abdominal pain, dysphagia and heartburn. Negative for constipation and diarrhea.  Genitourinary: Negative for dysuria and hematuria.  Musculoskeletal: Positive for arthralgias. Negative for back pain, gait problem and joint  swelling.  Skin: Positive for color change. Negative for rash.  Neurological: Negative for dizziness, light-headedness and headaches.  Hematological: Negative for adenopathy. Bruises/bleeds easily.  Psychiatric/Behavioral: Positive for depression, dysphoric mood and sleep  disturbance. The patient is nervous/anxious.     Patient Active Problem List   Diagnosis Date Noted  . Senile osteoporosis 06/11/2019  . Current severe episode of major depressive disorder without psychotic features ([place]) 06/11/2019  . Myalgia due to statin 06/11/2019  . Esophageal dysphagia 05/03/2018  . Risk for falls 03/21/2018  . Iron deficiency anemia 03/18/2018  . Weakness of both legs 10/28/2017  . Other age-related cataract 05/31/2016  . Paroxysmal atrial fibrillation ([place]) 04/15/2016  . Skin lesion of breast 11/21/2013  . CHF (congestive heart failure) ([place]) 10/16/2013  . Other seborrheic keratosis 01/03/2013  . Coronary atherosclerosis of native coronary artery 01/03/2013  . Vitamin D deficiency 12/19/2012  . Mixed hyperlipidemia 12/19/2012  . Essential (primary) hypertension 12/19/2012  . Chronic obstructive pulmonary disease, unspecified ([place]) 12/19/2012  . Generalized osteoarthrosis, involving multiple sites 12/19/2012  . Urinary incontinence 12/19/2012    Allergies  Allergen Reactions  . Penicillins Anaphylaxis, Rash and Other (See Comments)  . Azithromycin Other (See Comments)    abd pain   . Gemfibrozil Nausea And Vomiting and Rash       . Solifenacin     Mild urinary retention  . Statins Nausea And Vomiting and Rash    Stomach pain      Past Surgical History:  Procedure Laterality Date  . ABDOMINAL HYSTERECTOMY    . BREAST SURGERY    . FOREARM SURGERY      Social History   Tobacco Use  . Smoking status: Former Smoker    Packs/day: 0.50    Years: 45.00    Pack years: 22.50    Types: Cigarettes    Quit date: 06/10/2004    Years since quitting: 15.3  . Smokeless tobacco: Never Used  Vaping Use  . Vaping Use: Never used  Substance Use Topics  . Alcohol use: Not Currently  . Drug use: Not Currently     Medication list has been reviewed and updated.  Current Meds  Medication Sig  . albuterol (VENTOLIN HFA) 108 (90 Base) MCG/ACT  inhaler ventolin hfa 108 (90 base) mcg/actaers  . ALPRAZolam (XANAX) 0.5 MG tablet Take 0.5 mg by mouth 3 (three) times daily as needed.   . Cholecalciferol (VITAMIN D) 125 MCG (5000 UT) CAPS Take by mouth.  . cloNIDine (CATAPRES) 0.2 MG tablet   . furosemide (LASIX) 40 MG tablet Take 1 tablet by mouth in the morning and at bedtime.  . Melatonin 5 MG CHEW Chew 10 mg by mouth.   . Metoprolol Succinate 200 MG CS24 Take 200 mg by mouth daily.  . montelukast (SINGULAIR) 10 MG tablet Take by mouth at bedtime.   . nitroGLYCERIN (NITROSTAT) 0.4 MG SL tablet Nitrostat 0.4 mg sublingual tablet  PLACE 1 T UNDER THE TONGUE Q 5 MINUTES PRF CHEST PAIN  . omeprazole (PRILOSEC) 20 MG capsule TAKE 1 CAPSULE (20 MG TOTAL) BY MOUTH 2 (TWO) TIMES DAILY BEFORE A MEAL.  Marland Kitchen potassium chloride (KLOR-CON) 10 MEQ tablet   . ramipril (ALTACE) 10 MG capsule Take 10 mg by mouth 2 (two) times daily.   . temazepam (RESTORIL) 30 MG capsule temazepam 30 mg caps  . traMADol (ULTRAM) 50 MG tablet Take by mouth every 6 (six) hours as needed.  . vitamin E 180 MG (400 UNITS) capsule Take  by mouth.    PHQ 2/9 Scores 10/09/2019 09/26/2019 06/11/2019  PHQ - 2 Score 2 6 6   PHQ- 9 Score 9 18 23     GAD 7 : Generalized Anxiety Score 10/09/2019  Nervous, Anxious, on Edge 2  Control/stop worrying 2  Worry too much - different things 2  Trouble relaxing 2  Restless 1  Easily annoyed or irritable 1  Afraid - awful might happen 2  Total GAD 7 Score 12  Anxiety Difficulty Not difficult at all    BP Readings from Last 3 Encounters:  10/09/19 136/84  06/11/19 123/68    Physical Exam Vitals and nursing note reviewed.  Constitutional:      General: She is not in acute distress.    Appearance: Normal appearance. She is well-developed.  HENT:     Head: Normocephalic and atraumatic.     Right Ear: Tympanic membrane and ear canal normal. Decreased hearing noted.     Left Ear: Tympanic membrane and ear canal normal. Decreased  hearing noted.     Nose:     Right Sinus: No maxillary sinus tenderness.     Left Sinus: No maxillary sinus tenderness.  Eyes:     General: No scleral icterus.       Right eye: No discharge.        Left eye: No discharge.     Conjunctiva/sclera: Conjunctivae normal.  Neck:     Thyroid: No thyromegaly.     Vascular: No carotid bruit.  Cardiovascular:     Rate and Rhythm: Normal rate and regular rhythm.     Pulses: Normal pulses.     Heart sounds: Normal heart sounds. No murmur heard.   Pulmonary:     Effort: Pulmonary effort is normal. No respiratory distress.     Breath sounds: No wheezing.  Chest:    Abdominal:     General: Abdomen is flat. Bowel sounds are normal.     Palpations: Abdomen is soft.     Tenderness: There is no abdominal tenderness.  Musculoskeletal:     Cervical back: Normal range of motion. No erythema.     Right lower leg: No edema.     Left lower leg: No edema.  Lymphadenopathy:     Cervical: No cervical adenopathy.  Skin:    General: Skin is warm and dry.     Capillary Refill: Capillary refill takes less than 2 seconds.     Findings: No rash.  Neurological:     General: No focal deficit present.     Mental Status: She is alert and oriented to person, place, and time.     Cranial Nerves: No cranial nerve deficit.     Sensory: No sensory deficit.     Deep Tendon Reflexes: Reflexes are normal and symmetric.  Psychiatric:        Attention and Perception: She is inattentive.        Mood and Affect: Mood normal.        Speech: Speech normal.     Wt Readings from Last 3 Encounters:  10/09/19 140 lb (63.5 kg)  06/11/19 137 lb (62.1 kg)    BP 136/84   Pulse 73   Temp 98 F (36.7 C) (Oral)   Ht 5' (1.524 m)   Wt 140 lb (63.5 kg)   SpO2 98%   BMI 27.34 kg/m   Assessment and Plan: 1. Annual physical exam Aged out of mammogram and colonoscopy Immunizations are up to date - POCT urinalysis dipstick  2. Aortic atherosclerosis (HCC) Pt is  reportedly intolerant of statins but it unclear which ones she has tried; she is not interested in trying new medication Recent chest pain concerning but atypical Encourage her to establish with local cardiologist asap (daughter has the number to call to reschedule). - Lipid panel - nitroGLYCERIN (NITROSTAT) 0.4 MG SL tablet; Place 1 tablet (0.4 mg total) under the tongue every 5 (five) minutes as needed for chest pain.  Dispense: 30 tablet; Refill: 1  3. Paroxysmal atrial fibrillation (HCC) In SR today Continue DOAC therapy  4. Essential (primary) hypertension Clinically stable exam with well controlled BP. Tolerating medications without side effects at this time. Pt to continue current regimen and low sodium diet; benefits of regular exercise as able discussed. - CBC with Differential/Platelet - Comprehensive metabolic panel  5. Chronic obstructive pulmonary disease, unspecified COPD type ([place]) Chronic stable SOB treated with Advair and singlair She does not require O2 therapy  6. Esophageal dysphagia Symptoms well controlled on daily PPI No red flag signs such as weight loss, n/v, melena Will continue prilosec.  7. Current severe episode of major depressive disorder without psychotic features, unspecified whether recurrent ([place]) Not currently on effective treatment but she is under Psych care with a long history that I do not have access too. I will suggest to her daughter that she may need to discuss her symptoms in greater detail with Dr. Mariel Kansky  8. Vitamin D deficiency Check labs Continue daily supplementation - VITAMIN D 25 Hydroxy (Vit-D Deficiency, Fractures)  9. Bilateral hearing loss, unspecified hearing loss type - Ambulatory referral to ENT  10. Acquired thrombophilia ([place]) Mild bruising due to Xarelto therapy Pt is reassured and will continue current regimen.   Partially dictated using Editor, commissioning. Any errors are unintentional.  Halina Maidens, MD [facility]  10/09/2019

## 2019-10-10 ENCOUNTER — Other Ambulatory Visit: Payer: Self-pay

## 2019-10-10 ENCOUNTER — Telehealth: Payer: Self-pay | Admitting: Internal Medicine

## 2019-10-10 DIAGNOSIS — D649 Anemia, unspecified: Secondary | ICD-10-CM

## 2019-10-10 LAB — COMPREHENSIVE METABOLIC PANEL
ALT: 17 IU/L (ref 0–32)
AST: 24 IU/L (ref 0–40)
Albumin/Globulin Ratio: 2 (ref 1.2–2.2)
Albumin: 4.3 g/dL (ref 3.6–4.6)
Alkaline Phosphatase: 70 IU/L (ref 48–121)
BUN/Creatinine Ratio: 13 (ref 12–28)
BUN: 17 mg/dL (ref 8–27)
Bilirubin Total: 0.3 mg/dL (ref 0.0–1.2)
CO2: 21 mmol/L (ref 20–29)
Calcium: 9.1 mg/dL (ref 8.7–10.3)
Chloride: 101 mmol/L (ref 96–106)
Creatinine, Ser: 1.33 mg/dL — ABNORMAL HIGH (ref 0.57–1.00)
GFR calc Af Amer: 43 mL/min/{1.73_m2} — ABNORMAL LOW (ref >59)
GFR calc non Af Amer: 37 mL/min/{1.73_m2} — ABNORMAL LOW (ref >59)
Globulin, Total: 2.2 g/dL (ref 1.5–4.5)
Glucose: 119 mg/dL — ABNORMAL HIGH (ref 65–99)
Potassium: 4 mmol/L (ref 3.5–5.2)
Sodium: 137 mmol/L (ref 134–144)
Total Protein: 6.5 g/dL (ref 6.0–8.5)

## 2019-10-10 LAB — CBC WITH DIFFERENTIAL/PLATELET
Basophils Absolute: 0.1 10*3/uL (ref 0.0–0.2)
Basos: 1 %
EOS (ABSOLUTE): 0 10*3/uL (ref 0.0–0.4)
Eos: 0 %
Hematocrit: 23.6 % — ABNORMAL LOW (ref 34.0–46.6)
Hemoglobin: 7.2 g/dL — ABNORMAL LOW (ref 11.1–15.9)
Immature Grans (Abs): 0 10*3/uL (ref 0.0–0.1)
Immature Granulocytes: 0 %
Lymphocytes Absolute: 1.7 10*3/uL (ref 0.7–3.1)
Lymphs: 26 %
MCH: 22.4 pg — ABNORMAL LOW (ref 26.6–33.0)
MCHC: 30.5 g/dL — ABNORMAL LOW (ref 31.5–35.7)
MCV: 73 fL — ABNORMAL LOW (ref 79–97)
Monocytes Absolute: 0.6 10*3/uL (ref 0.1–0.9)
Monocytes: 9 %
Neutrophils Absolute: 4 10*3/uL (ref 1.4–7.0)
Neutrophils: 64 %
RBC: 3.22 x10E6/uL — ABNORMAL LOW (ref 3.77–5.28)
RDW: 14.4 % (ref 11.7–15.4)
WBC: 6.4 10*3/uL (ref 3.4–10.8)

## 2019-10-10 LAB — LIPID PANEL
Chol/HDL Ratio: 4.7 ratio — ABNORMAL HIGH (ref 0.0–4.4)
Cholesterol, Total: 227 mg/dL — ABNORMAL HIGH (ref 100–199)
HDL: 48 mg/dL (ref >39)
LDL Chol Calc (NIH): 156 mg/dL — ABNORMAL HIGH (ref 0–99)
Triglycerides: 129 mg/dL (ref 0–149)
VLDL Cholesterol Cal: 23 mg/dL (ref 5–40)

## 2019-10-10 LAB — VITAMIN D 25 HYDROXY (VIT D DEFICIENCY, FRACTURES): Vit D, 25-Hydroxy: 59.8 ng/mL (ref 30.0–100.0)

## 2019-10-10 NOTE — Telephone Encounter (Unsigned)
Copied from [place] [id number]. Topic: Quick Communication - Lab Results (Clinic Use ONLY) >> Oct 10, 2019 10:34 AM Clista Bernhardt, CMA wrote: Called patient to inform them of recent lab results. When patient returns call, triage nurse may disclose results. >> Oct 10, 2019  1:57 PM Sheran Luz wrote: Patient's daughter calling back for lab results. Patient would like to speak with Chassidy, not [facility] NT.

## 2019-10-10 NOTE — Telephone Encounter (Signed)
Spoke with patients daughter about labs. Placed URGENT referral to GI for patient. Pt will stop Xarelto until we find the cause of bleeding. Will wait on stool cards to see what GI wants to do to determine blood loss. CM

## 2019-10-11 ENCOUNTER — Encounter: Payer: Self-pay | Admitting: Gastroenterology

## 2019-10-11 ENCOUNTER — Ambulatory Visit: Payer: Medicare HMO | Admitting: Gastroenterology

## 2019-10-11 ENCOUNTER — Telehealth: Payer: Self-pay

## 2019-10-11 ENCOUNTER — Other Ambulatory Visit: Payer: Self-pay

## 2019-10-11 VITALS — BP 193/62 | HR 77 | Temp 97.7°F | Wt 142.2 lb

## 2019-10-11 DIAGNOSIS — R1319 Other dysphagia: Secondary | ICD-10-CM

## 2019-10-11 DIAGNOSIS — R1310 Dysphagia, unspecified: Secondary | ICD-10-CM

## 2019-10-11 DIAGNOSIS — D509 Iron deficiency anemia, unspecified: Secondary | ICD-10-CM | POA: Diagnosis not present

## 2019-10-11 NOTE — Telephone Encounter (Signed)
Patient needs cardiac and blood thinner-Xarelto blood thinner. These forms were faxed to Dr. Alveria Apley office. Patient has an appointment tomorrow-10/12/2019 and hopefully we could obtain clearance so we could schedule patient to have an EGD next week with Dr. Bonna Gains.

## 2019-10-11 NOTE — Progress Notes (Signed)
Jessica Howell [address]  [place]  [place], [place] [address]  Main: [phone]  Fax: [phone]   Gastroenterology Consultation  Referring Provider:     Glean Hess, MD Primary Care Physician:  Glean Hess, MD Reason for Consultation:    Anemia        HPI:    Chief Complaint  Patient presents with  . New Patient (Initial Visit)  . iron deficiency anemia    Patient stated that she had been feeling tired, fatigued and SOB.    Jessica Howell is a 85 y.o. y/o female referred for consultation & management  by Dr. Army Melia, Jesse Sans, MD.  Patient presents with her daughter, who helps provide history, patient is hard of hearing.  Patient was referred due to anemia noted on regular blood work by PCP.  Microcytic anemia.  Patient denies any episodes of bleeding from any sources.  However, does report "choking sensation" with swallowing liquids or solids.  Patient had an upper GI study in February 2020 at [facility] that reported "Linear filling defect along the anterior wall of the lower esophagus which was only seen in one position and did not persist on other views. This finding is indeterminate and may be artifactual or secondary to mass effect from a mildly dilated left atrium."  She has never had an EGD or colonoscopy.  Past Medical History:  Diagnosis Date  . A-fib ([place])   . Allergy   . CHF (congestive heart failure) ([place])   . COPD (chronic obstructive pulmonary disease) ([place])   . Depression   . Feeling grief   . GERD (gastroesophageal reflux disease)   . Hyperlipidemia   . Hypertension   . Hypokalemia 09/06/2016   Last Assessment & Plan:  Formatting of this note is different from the original. Will continue to monitor; ordered labs as below. Lab Results  Component Value Date   K 3.2 (L) 04/12/2016   K 3.5 04/10/2016   K 4.2 04/09/2016   K 4.4 11/21/2013   K 4.4 01/03/2013  . Osteoarthritis   . Post-nasal drip 11/10/2015   Formatting of this note might be  different from the original. Overview:   (Working Diagnosis)  Last Assessment & Plan:  Formatting of this note might be different from the original. Continue OTC flonase 2 sprays daily Likely contributing to cough.  Will see if flonase helps with cough and PND  . Right wrist pain 06/20/2015   Last Assessment & Plan:  Formatting of this note might be different from the original. Significant edema, and some grip and extension weakness, and persistent severe pain. She was not able to tolerate splint Reviewed xr of her recent ED visit No fractures identified (except old fracture on radial head)  +++ snuff box tenderness persists [facility]erred to [facility] clinic Information provide  . Vitamin D deficiency     Past Surgical History:  Procedure Laterality Date  . ABDOMINAL HYSTERECTOMY    . BREAST SURGERY    . FOREARM SURGERY      Prior to Admission medications   Medication Sig Start Date End Date Taking? Authorizing Provider  albuterol (VENTOLIN HFA) 108 (90 Base) MCG/ACT inhaler ventolin hfa 108 (90 base) mcg/actaers   Yes [provider]  ALPRAZolam (XANAX) 0.5 MG tablet Take 0.5 mg by mouth 3 (three) times daily as needed.    Yes [provider]  Cholecalciferol (VITAMIN D) 125 MCG (5000 UT) CAPS Take by mouth.  Yes [provider]  Fluticasone-Salmeterol (ADVAIR DISKUS) 250-50 MCG/DOSE AEPB Inhale into the lungs. 04/19/19 10/11/19 Yes [provider]  furosemide (LASIX) 40 MG tablet Take 1 tablet by mouth in the morning and at bedtime. 04/19/19  Yes [provider]  Melatonin 5 MG CHEW Chew 10 mg by mouth.    Yes [provider]  Metoprolol Succinate 200 MG CS24 Take 200 mg by mouth daily.   Yes [provider]  montelukast (SINGULAIR) 10 MG tablet Take by mouth at bedtime.  08/07/15  Yes [provider]  nitroGLYCERIN (NITROSTAT) 0.4 MG SL tablet Place 1 tablet (0.4 mg total) under the tongue every 5 (five) minutes  as needed for chest pain. 10/09/19  Yes Glean Hess, MD  omeprazole (PRILOSEC) 20 MG capsule TAKE 1 CAPSULE (20 MG TOTAL) BY MOUTH 2 (TWO) TIMES DAILY BEFORE A MEAL. 08/30/19  Yes Glean Hess, MD  potassium chloride (KLOR-CON) 10 MEQ tablet  04/27/19  Yes [provider]  ramipril (ALTACE) 10 MG capsule Take 10 mg by mouth 2 (two) times daily.  09/21/13  Yes [provider]  temazepam (RESTORIL) 30 MG capsule temazepam 30 mg caps   Yes [provider]  traMADol (ULTRAM) 50 MG tablet Take by mouth every 6 (six) hours as needed.   Yes [provider]  vitamin E 180 MG (400 UNITS) capsule Take by mouth.   Yes [provider]  cloNIDine (CATAPRES) 0.2 MG tablet  09/27/19   [provider]  Rivaroxaban (XARELTO) 15 MG TABS tablet Take by mouth. Patient not taking: Reported on 10/11/2019 09/26/17 07/18/19  [provider]    Family History  Problem Relation Age of Onset  . Diabetes Mother   . Hypertension Mother   . Cancer Father        esoph.   Marland Kitchen COPD Sister   . Heart disease Brother   . Early death Son      Social History   Tobacco Use  . Smoking status: Former Smoker    Packs/day: 0.50    Years: 45.00    Pack years: 22.50    Types: Cigarettes    Quit date: 06/10/2004    Years since quitting: 15.3  . Smokeless tobacco: Never Used  Vaping Use  . Vaping Use: Never used  Substance Use Topics  . Alcohol use: Not Currently  . Drug use: Not Currently    Allergies as of 10/11/2019 - Review Complete 10/11/2019  Allergen Reaction Noted  . Penicillins Anaphylaxis, Rash, and Other (See Comments) 12/18/2012  . Azithromycin Other (See Comments) 12/18/2012  . Gemfibrozil Nausea And Vomiting and Rash 06/04/2014  . Solifenacin  06/16/2018  . Statins Nausea And Vomiting and Rash 09/05/2013    Review of Systems:    All systems reviewed and negative except where noted in HPI.   Physical Exam:  BP (!) 196/65   Pulse 77    Temp 97.7 F (36.5 C) (Oral)   Wt 142 lb 3.2 oz (64.5 kg)   BMI 27.77 kg/m  No LMP recorded. Patient has had a hysterectomy. Psych:  Alert and cooperative. Normal mood and affect. General:   Alert,  Well-developed, well-nourished, pleasant and cooperative in NAD Head:  Normocephalic and atraumatic. Eyes:  Sclera clear, no icterus.   Conjunctiva pink. Ears:  Normal auditory acuity. Nose:  No deformity, discharge, or lesions. Mouth:  No deformity or lesions,oropharynx pink & moist. Neck:  Supple; no masses or thyromegaly. Abdomen:  Normal  bowel sounds.  No bruits.  Soft, non-tender and non-distended without masses, hepatosplenomegaly or hernias noted.  No guarding or rebound tenderness.    Msk:  Symmetrical without gross deformities. Good, equal movement & strength bilaterally. Pulses:  Normal pulses noted. Extremities:  No clubbing or edema.  No cyanosis. Neurologic:  Alert and oriented x3;  grossly normal neurologically. Skin:  Intact without significant lesions or rashes. No jaundice. Lymph Nodes:  No significant cervical adenopathy. Psych:  Alert and cooperative. Normal mood and affect.   Labs: CBC    Component Value Date/Time   WBC 6.4 10/09/2019 1123   RBC 3.22 (L) 10/09/2019 1123   HGB 7.2 (L) 10/09/2019 1123   HCT 23.6 (L) 10/09/2019 1123   PLT CANCELED 10/09/2019 1123   MCV 73 (L) 10/09/2019 1123   MCH 22.4 (L) 10/09/2019 1123   MCHC 30.5 (L) 10/09/2019 1123   RDW 14.4 10/09/2019 1123   LYMPHSABS 1.7 10/09/2019 1123   EOSABS 0.0 10/09/2019 1123   BASOSABS 0.1 10/09/2019 1123   CMP     Component Value Date/Time   NA 137 10/09/2019 1123   K 4.0 10/09/2019 1123   CL 101 10/09/2019 1123   CO2 21 10/09/2019 1123   GLUCOSE 119 (H) 10/09/2019 1123   BUN 17 10/09/2019 1123   CREATININE 1.33 (H) 10/09/2019 1123   CALCIUM 9.1 10/09/2019 1123   PROT 6.5 10/09/2019 1123   ALBUMIN 4.3 10/09/2019 1123   AST 24 10/09/2019 1123   ALT 17 10/09/2019 1123   ALKPHOS 70  10/09/2019 1123   BILITOT 0.3 10/09/2019 1123   GFRNONAA 37 (L) 10/09/2019 1123   GFRAA 43 (L) 10/09/2019 1123    Imaging Studies: Upper GI study from 2020 in Care Everywhere reviewed  Assessment and Plan:   Zenab Gronewold is a 85 y.o. y/o female has been referred for microcytic anemia  Due to patient's dysphagia, abnormal upper GI study in 2020 we will proceed with further evaluation with upper endoscopy to rule out malignancy  If this is negative, next that would be colonoscopy  Referral to hematology for microcytic anemia  Patient blood pressure is elevated today and was repeated and was still elevated.  Patient denies any chest pain, headaches or vision changes at this time.  States Dr. Army Melia is aware about her elevated blood pressures and patient is on blood pressure medications.  She also sees a cardiologist.  We will call her PCP and cardiology to see if they can get a sooner appointment with them, and if not I have requested them to go to urgent care  We will need cardiac clearance prior to her upper endoscopy  I have discussed alternative options, risks & benefits,  which include, but are not limited to, bleeding, infection, perforation,respiratory complication & drug reaction.  The patient agrees with this plan & written consent will be obtained.       Dr Jessica Howell  Speech recognition software was used to dictate the above note.

## 2019-10-11 NOTE — Patient Instructions (Signed)
We will send a referral for you to see Dr. Randa Evens at the [facility] for your iron deficiency anemia. They will be contacting you soon.  We will get in contact with Dr. Nehemiah Massed so he could give [place] clearance to schedule you an endoscopy. I will contact you once we do.

## 2019-10-16 ENCOUNTER — Emergency Department: Payer: Medicare HMO

## 2019-10-16 ENCOUNTER — Inpatient Hospital Stay: Payer: Medicare HMO | Attending: Oncology | Admitting: Oncology

## 2019-10-16 ENCOUNTER — Emergency Department
Admission: EM | Admit: 2019-10-16 | Discharge: 2019-10-16 | Disposition: A | Discharge status: Home / Self Care | Payer: Medicare HMO | Source: Home / Self Care | Attending: Emergency Medicine | Admitting: Emergency Medicine

## 2019-10-16 ENCOUNTER — Inpatient Hospital Stay: Payer: Medicare HMO

## 2019-10-16 ENCOUNTER — Other Ambulatory Visit: Payer: Self-pay

## 2019-10-16 ENCOUNTER — Encounter: Payer: Self-pay | Admitting: Oncology

## 2019-10-16 ENCOUNTER — Encounter: Payer: Self-pay | Admitting: Radiology

## 2019-10-16 VITALS — BP 208/133 | HR 174 | Resp 26

## 2019-10-16 VITALS — BP 191/69 | HR 84 | Temp 99.1°F | Resp 16 | Wt 143.4 lb

## 2019-10-16 DIAGNOSIS — M545 Low back pain, unspecified: Secondary | ICD-10-CM

## 2019-10-16 DIAGNOSIS — J449 Chronic obstructive pulmonary disease, unspecified: Secondary | ICD-10-CM | POA: Insufficient documentation

## 2019-10-16 DIAGNOSIS — Z79899 Other long term (current) drug therapy: Secondary | ICD-10-CM | POA: Insufficient documentation

## 2019-10-16 DIAGNOSIS — R0789 Other chest pain: Secondary | ICD-10-CM | POA: Insufficient documentation

## 2019-10-16 DIAGNOSIS — F419 Anxiety disorder, unspecified: Secondary | ICD-10-CM

## 2019-10-16 DIAGNOSIS — T887XXA Unspecified adverse effect of drug or medicament, initial encounter: Secondary | ICD-10-CM

## 2019-10-16 DIAGNOSIS — Z87891 Personal history of nicotine dependence: Secondary | ICD-10-CM | POA: Insufficient documentation

## 2019-10-16 DIAGNOSIS — M549 Dorsalgia, unspecified: Secondary | ICD-10-CM | POA: Diagnosis not present

## 2019-10-16 DIAGNOSIS — I509 Heart failure, unspecified: Secondary | ICD-10-CM | POA: Insufficient documentation

## 2019-10-16 DIAGNOSIS — I10 Essential (primary) hypertension: Secondary | ICD-10-CM

## 2019-10-16 DIAGNOSIS — D509 Iron deficiency anemia, unspecified: Secondary | ICD-10-CM

## 2019-10-16 DIAGNOSIS — I110 Hypertensive heart disease with heart failure: Secondary | ICD-10-CM | POA: Insufficient documentation

## 2019-10-16 DIAGNOSIS — R109 Unspecified abdominal pain: Secondary | ICD-10-CM | POA: Insufficient documentation

## 2019-10-16 LAB — CBC WITH DIFFERENTIAL/PLATELET
Abs Immature Granulocytes: 0.03 10*3/uL (ref 0.00–0.07)
Basophils Absolute: 0.1 10*3/uL (ref 0.0–0.1)
Basophils Relative: 1 %
Eosinophils Absolute: 0.1 10*3/uL (ref 0.0–0.5)
Eosinophils Relative: 1 %
HCT: 22.6 % — ABNORMAL LOW (ref 36.0–46.0)
Hemoglobin: 6.7 g/dL — ABNORMAL LOW (ref 12.0–15.0)
Immature Granulocytes: 0 %
Lymphocytes Relative: 24 %
Lymphs Abs: 2 10*3/uL (ref 0.7–4.0)
MCH: 21.1 pg — ABNORMAL LOW (ref 26.0–34.0)
MCHC: 29.6 g/dL — ABNORMAL LOW (ref 30.0–36.0)
MCV: 71.3 fL — ABNORMAL LOW (ref 80.0–100.0)
Monocytes Absolute: 1 10*3/uL (ref 0.1–1.0)
Monocytes Relative: 12 %
Neutro Abs: 5.2 10*3/uL (ref 1.7–7.7)
Neutrophils Relative %: 62 %
RBC: 3.17 MIL/uL — ABNORMAL LOW (ref 3.87–5.11)
RDW: 15.4 % (ref 11.5–15.5)
WBC: 8.3 10*3/uL (ref 4.0–10.5)
nRBC: 0 % (ref 0.0–0.2)

## 2019-10-16 LAB — COMPREHENSIVE METABOLIC PANEL
ALT: 18 U/L (ref 0–44)
AST: 20 U/L (ref 15–41)
Albumin: 4 g/dL (ref 3.5–5.0)
Alkaline Phosphatase: 66 U/L (ref 38–126)
Anion gap: 11 (ref 5–15)
BUN: 13 mg/dL (ref 8–23)
CO2: 26 mmol/L (ref 22–32)
Calcium: 9.3 mg/dL (ref 8.9–10.3)
Chloride: 100 mmol/L (ref 98–111)
Creatinine, Ser: 1.1 mg/dL — ABNORMAL HIGH (ref 0.44–1.00)
GFR calc Af Amer: 54 mL/min — ABNORMAL LOW (ref >60)
GFR calc non Af Amer: 47 mL/min — ABNORMAL LOW (ref >60)
Glucose, Bld: 118 mg/dL — ABNORMAL HIGH (ref 70–99)
Potassium: 3.7 mmol/L (ref 3.5–5.1)
Sodium: 137 mmol/L (ref 135–145)
Total Bilirubin: 0.5 mg/dL (ref 0.3–1.2)
Total Protein: 7.7 g/dL (ref 6.5–8.1)

## 2019-10-16 LAB — FERRITIN: Ferritin: 10 ng/mL — ABNORMAL LOW (ref 11–307)

## 2019-10-16 LAB — VITAMIN B12: Vitamin B-12: 281 pg/mL (ref 180–914)

## 2019-10-16 LAB — TROPONIN I (HIGH SENSITIVITY)
Troponin I (High Sensitivity): 38 ng/L — ABNORMAL HIGH (ref <18)
Troponin I (High Sensitivity): 47 ng/L — ABNORMAL HIGH (ref <18)

## 2019-10-16 LAB — FOLATE: Folate: 21.4 ng/mL (ref >5.9)

## 2019-10-16 LAB — IRON AND TIBC
Iron: 11 ug/dL — ABNORMAL LOW (ref 28–170)
Saturation Ratios: 2 % — ABNORMAL LOW (ref 10.4–31.8)
TIBC: 532 ug/dL — ABNORMAL HIGH (ref 250–450)
UIBC: 521 ug/dL

## 2019-10-16 LAB — RETICULOCYTES
Immature Retic Fract: 17.2 % — ABNORMAL HIGH (ref 2.3–15.9)
RBC.: 3.2 MIL/uL — ABNORMAL LOW (ref 3.87–5.11)
Retic Count, Absolute: 67.5 10*3/uL (ref 19.0–186.0)
Retic Ct Pct: 2.1 % (ref 0.4–3.1)

## 2019-10-16 LAB — TSH: TSH: 2.84 u[IU]/mL (ref 0.350–4.500)

## 2019-10-16 LAB — ABO/RH: ABO/RH(D): O POS

## 2019-10-16 LAB — SAMPLE TO BLOOD BANK

## 2019-10-16 MED ORDER — ONDANSETRON HCL 4 MG/2ML IJ SOLN
INTRAMUSCULAR | Status: AC
  Filled 2019-10-16: qty 2

## 2019-10-16 MED ORDER — FAMOTIDINE IN NACL 20-0.9 MG/50ML-% IV SOLN
20.0000 mg | Freq: Once | INTRAVENOUS | Status: AC | PRN
  Administered 2019-10-16: 20 mg via INTRAVENOUS

## 2019-10-16 MED ORDER — HEPARIN SOD (PORK) LOCK FLUSH 100 UNIT/ML IV SOLN
250.0000 [IU] | Freq: Once | INTRAVENOUS | Status: DC | PRN
  Filled 2019-10-16: qty 5

## 2019-10-16 MED ORDER — ALTEPLASE 2 MG IJ SOLR
2.0000 mg | Freq: Once | INTRAMUSCULAR | Status: DC | PRN
  Filled 2019-10-16: qty 2

## 2019-10-16 MED ORDER — LORAZEPAM 2 MG/ML IJ SOLN
INTRAMUSCULAR | Status: AC
  Filled 2019-10-16: qty 1

## 2019-10-16 MED ORDER — MORPHINE SULFATE (PF) 4 MG/ML IV SOLN
INTRAVENOUS | Status: AC
  Filled 2019-10-16: qty 1

## 2019-10-16 MED ORDER — ONDANSETRON HCL 4 MG/2ML IJ SOLN
4.0000 mg | Freq: Once | INTRAMUSCULAR | Status: AC
  Administered 2019-10-16: 4 mg via INTRAVENOUS

## 2019-10-16 MED ORDER — MORPHINE SULFATE (PF) 2 MG/ML IV SOLN
2.0000 mg | Freq: Once | INTRAVENOUS | Status: AC
  Administered 2019-10-16: 2 mg via INTRAVENOUS
  Filled 2019-10-16: qty 1

## 2019-10-16 MED ORDER — CLONIDINE HCL 0.1 MG PO TABS
0.1000 mg | ORAL_TABLET | Freq: Once | ORAL | Status: AC
  Administered 2019-10-16: 0.1 mg via ORAL
  Filled 2019-10-16: qty 1

## 2019-10-16 MED ORDER — SODIUM CHLORIDE 0.9% FLUSH
10.0000 mL | Freq: Once | INTRAVENOUS | Status: DC | PRN
  Filled 2019-10-16: qty 10

## 2019-10-16 MED ORDER — DIPHENHYDRAMINE HCL 50 MG/ML IJ SOLN
50.0000 mg | Freq: Once | INTRAMUSCULAR | Status: AC | PRN
  Administered 2019-10-16: 50 mg via INTRAVENOUS

## 2019-10-16 MED ORDER — SODIUM CHLORIDE 0.9 % IV SOLN
Freq: Once | INTRAVENOUS | Status: AC
  Filled 2019-10-16: qty 250

## 2019-10-16 MED ORDER — MORPHINE SULFATE (PF) 4 MG/ML IV SOLN
4.0000 mg | Freq: Once | INTRAVENOUS | Status: AC
  Administered 2019-10-16: 4 mg via INTRAVENOUS

## 2019-10-16 MED ORDER — SODIUM CHLORIDE 0.9% FLUSH
3.0000 mL | Freq: Once | INTRAVENOUS | Status: DC | PRN
  Filled 2019-10-16: qty 3

## 2019-10-16 MED ORDER — SODIUM CHLORIDE 0.9 % IV SOLN
510.0000 mg | Freq: Once | INTRAVENOUS | Status: AC
  Administered 2019-10-16: 510 mg via INTRAVENOUS
  Filled 2019-10-16: qty 510

## 2019-10-16 MED ORDER — LORAZEPAM 2 MG/ML IJ SOLN
0.5000 mg | Freq: Once | INTRAMUSCULAR | Status: AC
  Administered 2019-10-16: 0.5 mg via INTRAVENOUS

## 2019-10-16 MED ORDER — HEPARIN SOD (PORK) LOCK FLUSH 100 UNIT/ML IV SOLN
500.0000 [IU] | Freq: Once | INTRAVENOUS | Status: DC | PRN
  Filled 2019-10-16: qty 5

## 2019-10-16 MED ORDER — IOHEXOL 350 MG/ML SOLN
75.0000 mL | Freq: Once | INTRAVENOUS | Status: AC | PRN
  Administered 2019-10-16: 75 mL via INTRAVENOUS

## 2019-10-16 MED ORDER — METHYLPREDNISOLONE SODIUM SUCC 125 MG IJ SOLR
125.0000 mg | Freq: Once | INTRAMUSCULAR | Status: AC | PRN
  Administered 2019-10-16: 125 mg via INTRAVENOUS

## 2019-10-16 MED ORDER — MORPHINE SULFATE (PF) 4 MG/ML IV SOLN: 4.0000 mg | Freq: Once | INTRAVENOUS | Status: DC

## 2019-10-16 NOTE — Progress Notes (Signed)
Pt sob on exertion and sometimes just talking has sob. Weakness/

## 2019-10-16 NOTE — ED Provider Notes (Signed)
[facility] Emergency Department Provider Note  ____________________________________________  Time seen: Approximately 4:11 PM  I have reviewed the triage vital signs and the nursing notes.   HISTORY  Chief Complaint Back pain   HPI Jessica Howell is a 85 y.o. female with a history of atrial fibrillation, CHF, COPD, iron deficiency anemia who is brought to the ED from cancer center due to sudden onset of severe mid back pain radiating to abdomen and chest that started  about 1 hour ago, constant, no aggravating or alleviating factors.  Cancer Center Dr. Janese Banks reports that patient had just started an iron infusion for iron deficiency anemia when she reported a strange sensation.  The infusion was discontinued immediately, but patient rapidly developed the onset of back pain, so severe that the patient is screaming on arrival to ED.  [place] gave 125 mg of Solu-Medrol IV, 50 mg Benadryl IV, 2 mg morphine IV for symptom relief.     Past Medical History:  Diagnosis Date   A-fib ([place])    Allergy    Anemia    CHF (congestive heart failure) (HCC)    COPD (chronic obstructive pulmonary disease) (HCC)    Depression    Feeling grief    GERD (gastroesophageal reflux disease)    Hyperlipidemia    Hypertension    Hypokalemia 09/06/2016   Last Assessment & Plan:  Formatting of this note is different from the original. Will continue to monitor; ordered labs as below. Lab Results  Component Value Date   K 3.2 (L) 04/12/2016   K 3.5 04/10/2016   K 4.2 04/09/2016   K 4.4 11/21/2013   K 4.4 01/03/2013   Myocardial infarction [facility])    Osteoarthritis    Post-nasal drip 11/10/2015   Formatting of this note might be different from the original. Overview:   (Working Diagnosis)  Last Assessment & Plan:  Formatting of this note might be different from the original. Continue OTC flonase 2 sprays daily Likely contributing to cough.  Will see if flonase helps with  cough and PND   Right wrist pain 06/20/2015   Last Assessment & Plan:  Formatting of this note might be different from the original. Significant edema, and some grip and extension weakness, and persistent severe pain. She was not able to tolerate splint Reviewed xr of her recent ED visit No fractures identified (except old fracture on radial head)  +++ snuff box tenderness persists Referred to [facility] Information provide   Vitamin D deficiency      Patient Active Problem List   Diagnosis Date Noted   Microcytic anemia 10/16/2019   Acquired thrombophilia ([place]) 10/09/2019   Senile osteoporosis 06/11/2019   Current severe episode of major depressive disorder without psychotic features ([place]) 06/11/2019   Myalgia due to statin 06/11/2019   Esophageal dysphagia 05/03/2018   Risk for falls 03/21/2018   Iron deficiency anemia 03/18/2018   Weakness of both legs 10/28/2017   Other age-related cataract 05/31/2016   Paroxysmal atrial fibrillation ([place]) 04/15/2016   Skin lesion of breast 11/21/2013   CHF (congestive heart failure) ([place]) 10/16/2013   Other seborrheic keratosis 01/03/2013   Coronary atherosclerosis of native coronary artery 01/03/2013   Vitamin D deficiency 12/19/2012   Mixed hyperlipidemia 12/19/2012   Essential (primary) hypertension 12/19/2012   Chronic obstructive pulmonary disease, unspecified ([place]) 12/19/2012   Generalized osteoarthrosis, involving multiple sites 12/19/2012   Urinary incontinence 12/19/2012     Past Surgical History:  Procedure  Laterality Date   ABDOMINAL HYSTERECTOMY     BREAST SURGERY     FOREARM SURGERY       Prior to Admission medications   Medication Sig Start Date End Date Taking? Authorizing Provider  albuterol (VENTOLIN HFA) 108 (90 Base) MCG/ACT inhaler every 6 (six) hours as needed.     [provider]  ALPRAZolam Duanne Moron) 0.5 MG tablet Take 0.5 mg by mouth 3 (three) times daily as  needed.     [provider]  Cholecalciferol (VITAMIN D) 125 MCG (5000 UT) CAPS Take 1 capsule by mouth daily.     [provider]  cloNIDine (CATAPRES) 0.2 MG tablet  09/27/19   [provider]  Fluticasone-Salmeterol (ADVAIR DISKUS) 250-50 MCG/DOSE AEPB Inhale into the lungs. 04/19/19 10/11/19  [provider]  furosemide (LASIX) 40 MG tablet Take 1 tablet by mouth in the morning and at bedtime. 04/19/19   [provider]  Melatonin 5 MG CHEW Chew 10 mg by mouth.     [provider]  Metoprolol Succinate 200 MG CS24 Take 200 mg by mouth daily.    [provider]  montelukast (SINGULAIR) 10 MG tablet Take by mouth at bedtime.  08/07/15   [provider]  nitroGLYCERIN (NITROSTAT) 0.4 MG SL tablet Place 1 tablet (0.4 mg total) under the tongue every 5 (five) minutes as needed for chest pain. 10/09/19   Glean Hess, MD  omeprazole (PRILOSEC) 20 MG capsule TAKE 1 CAPSULE (20 MG TOTAL) BY MOUTH 2 (TWO) TIMES DAILY BEFORE A MEAL. 08/30/19   Glean Hess, MD  potassium chloride (KLOR-CON) 10 MEQ tablet Take 10 mEq by mouth 2 (two) times daily.  04/27/19   [provider]  ramipril (ALTACE) 10 MG capsule Take 10 mg by mouth 2 (two) times daily.  09/21/13   [provider]  temazepam (RESTORIL) 30 MG capsule temazepam 30 mg caps    [provider]  traMADol (ULTRAM) 50 MG tablet Take by mouth every 6 (six) hours as needed.    [provider]  vitamin E 180 MG (400 UNITS) capsule Take 400 Units by mouth daily.     [provider]     Allergies Penicillins, Iron, Azithromycin, Gemfibrozil, Solifenacin, and Statins   Family History  Problem Relation Age of Onset   Diabetes Mother    Hypertension Mother    Cancer Father        esoph.    COPD Sister    Heart disease Brother    Early death Son     Social History Social History   Tobacco Use   Smoking status: Former  Smoker    Packs/day: 0.50    Years: 45.00    Pack years: 22.50    Types: Cigarettes    Quit date: 06/10/2004    Years since quitting: 15.3   Smokeless tobacco: Never Used  Vaping Use   Vaping Use: Never used  Substance Use Topics   Alcohol use: Never   Drug use: Not Currently    Review of Systems  Constitutional:   No fever or chills.  ENT:   No sore throat. No rhinorrhea. Cardiovascular: Positive chest pain as above without syncope. Respiratory:   No dyspnea or cough. Gastrointestinal: Positive abdominal pain as above without vomiting and diarrhea.  Musculoskeletal:   Positive back pain as above All other systems reviewed and are negative except as documented above in ROS and HPI.  ____________________________________________   PHYSICAL EXAM:  VITAL SIGNS: ED Triage Vitals [10/16/19 1554]  Enc Vitals Group     BP      Pulse      Resp      Temp      Temp src      SpO2      Weight      Height      Head Circumference      Peak Flow      Pain Score 0     Pain Loc      Pain Edu?      Excl. in [place]?     Vital signs reviewed, nursing assessments reviewed.   Constitutional:   Alert and oriented.  Ill-appearing Eyes:   Conjunctivae are pale. EOMI. PERRL. ENT      Head:   Normocephalic and atraumatic.      Nose:   Normal      Mouth/Throat:   Dry mucous membranes, no tongue edema or elevation, floor mouth is soft, no uvular edema.      Neck:   No meningismus. Full ROM.  No crepitus Hematological/Lymphatic/Immunilogical:   No cervical lymphadenopathy. Cardiovascular:   Tachycardia heart rate 105. Symmetric bilateral radial pulses.  DP pulses are diminished but palpable and feet are warm with capillary refill of about 2 seconds..  No murmurs.  Respiratory:   Normal respiratory effort without tachypnea/retractions. Breath sounds are clear and equal bilaterally. No wheezes/rales/rhonchi. Gastrointestinal:   Soft with mild generalized tenderness, nonfocal. Non  distended. There is no CVA tenderness.  No rebound, rigidity, or guarding. Musculoskeletal:   Normal range of motion in all extremities. No joint effusions.  No lower extremity tenderness.  No edema. Neurologic:   Normal speech, limited language expression.  Motor grossly intact. No acute focal neurologic deficits are appreciated.  Skin:    Skin is warm, dry and intact. No rash noted.  No petechiae, purpura, or bullae.  ____________________________________________    LABS (pertinent positives/negatives) (all labs ordered are listed, but only abnormal results are displayed) Labs Reviewed  TROPONIN I (HIGH SENSITIVITY) - Abnormal; Notable for the following components:      Result Value   Troponin I (High Sensitivity) 38 (*)    All other components within normal limits  TROPONIN I (HIGH SENSITIVITY) - Abnormal; Notable for the following components:   Troponin I (High Sensitivity) 47 (*)    All other components within normal limits   ____________________________________________   EKG  Interpreted by me Sinus rhythm rate of 97, rightward axis.  Normal intervals.  Poor R wave progression.  Normal ST segments and T waves.  Artifact limits interpretation, but no clear ST abnormality.  ____________________________________________    RADIOLOGY  CT Angio Chest/Abd/Pel for Dissection W and/or Wo Contrast  Result Date: 10/16/2019 CLINICAL DATA:  85 year old female with chest pain. Concern for acute aortic syndrome. EXAM: CT ANGIOGRAPHY CHEST, ABDOMEN AND PELVIS TECHNIQUE: Non-contrast CT of the chest was initially obtained. Multidetector CT imaging through the chest, abdomen and pelvis was performed using the standard protocol during bolus administration of intravenous contrast. Multiplanar reconstructed images and MIPs were obtained and reviewed to evaluate the vascular anatomy. CONTRAST:  12mL OMNIPAQUE IOHEXOL 350 MG/ML SOLN COMPARISON:  None. FINDINGS: CTA CHEST FINDINGS Cardiovascular:  There is mild cardiomegaly. No pericardial effusion. Advanced 3 vessel coronary vascular calcification. There is advanced atherosclerotic calcification of the thoracic aorta. No aneurysmal dilatation or dissection. The origins of the great vessels of the aortic arch appear patent as visualized. There is mild  dilatation of main pulmonary trunk suggestive of pulmonary hypertension. Clinical correlation is recommended. Evaluation of the pulmonary arteries is limited due to suboptimal opacification and timing of the contrast. The central pulmonary arteries appear patent as visualized. Mediastinum/Nodes: Mildly enlarged right hilar lymph node measures 16 cm in short axis. No mediastinal adenopathy. The esophagus is grossly unremarkable. There is a 2 cm heterogeneous right thyroid nodule. Recommend thyroid US (ref: J Am Coll Radiol. 2015 Feb;12(2): 143-50).No mediastinal fluid collection. Lungs/Pleura: There is a 1 cm nodule along the superior aspect of the right major fissure. No focal consolidation, pleural effusion, pneumothorax. The central airways are patent. Musculoskeletal: Mild degenerative changes of the spine as well as arthritic changes of the shoulders. No acute osseous pathology. Review of the MIP images confirms the above findings. CTA ABDOMEN AND PELVIS FINDINGS VASCULAR Aorta: Advanced atherosclerotic calcification. No dissection or aneurysm. Celiac: There is atherosclerotic calcification of the origin of the celiac axis. The celiac artery and its main branches are patent. SMA: Atherosclerotic calcification of the proximal SMA. The SMA is patent. Renals: Atherosclerotic calcification of the renal arteries. The renal arteries remain patent. And accessory left renal artery noted. IMA: The IMA is patent. Inflow: Advanced atherosclerotic calcification of the iliac arteries. The iliac arteries remain patent. Veins: No obvious venous abnormality within the limitations of this arterial phase study. Review of the  MIP images confirms the above findings. NON-VASCULAR No intra-abdominal free air or free fluid. Hepatobiliary: No focal liver abnormality is seen. No gallstones, gallbladder wall thickening, or biliary dilatation. Pancreas: Unremarkable. No pancreatic ductal dilatation or surrounding inflammatory changes. Spleen: Normal in size without focal abnormality. Adrenals/Urinary Tract: The adrenal glands unremarkable. There is no hydronephrosis on either side. There is thank more atrophy and cortical scarring of the inferior pole of the right kidney. Several left renal cysts measure up to 2.5 cm. Subcentimeter right renal hypodense lesion is too small to characterize. This can be better evaluated with ultrasound on a nonemergent/outpatient basis. The visualized ureters and urinary bladder appear unremarkable. Stomach/Bowel: There is no bowel obstruction or active inflammation. The appendix is normal. Lymphatic: No adenopathy. Reproductive: Hysterectomy. Other: A 3.7 x 2.1 cm chronic appearing complex collection in the subcutaneous soft tissues of the left gluteal region. Additional fluid collection also noted more inferiorly. There is subcutaneous dystrophic calcification. A partially visualized larger fluid collection measuring at least 6.5 cm in the subcutaneous soft tissues of the right buttock. Musculoskeletal: Osteopenia. No acute osseous pathology. Review of the MIP images confirms the above findings. IMPRESSION: 1. No acute intrathoracic, abdominal, or pelvic pathology. No CT evidence of aortic dissection or aneurysm. 2. A 1 cm right upper lobe nodule along the major fissure. Consider one of the following in 3 months for both low-risk and high-risk individuals: (a) repeat chest CT, (b) follow-up PET-CT, or (c) tissue sampling. This recommendation follows the consensus statement: Guidelines for Management of Incidental Pulmonary Nodules Detected on CT Images: From the Fleischner Society 2017; Radiology 2017;  284:228-243. 3. Mild cardiomegaly with advanced 3 vessel coronary vascular calcification. 4. Mildly enlarged right hilar lymph node, nonspecific. Clinical correlation is recommended. 5. A 2 cm heterogeneous right thyroid nodule. Recommend thyroid US (ref: J Am Coll Radiol. 6. Aortic Atherosclerosis (ICD10-I70.0). Electronically Signed   By: Anner Crete M.D.   On: 10/16/2019 17:09    ____________________________________________   PROCEDURES Procedures  ____________________________________________  DIFFERENTIAL DIAGNOSIS   Aortic dissection, mesenteric ischemia, abdominal organ infarct such as renal infarct or splenic infarct, abdominal aortic  aneurysm, infusion side effect, anxiety  CLINICAL IMPRESSION / ASSESSMENT AND PLAN / ED COURSE  Medications ordered in the ED: Medications  LORazepam (ATIVAN) injection 0.5 mg (0.5 mg Intravenous Given 10/16/19 1600)  morphine 4 MG/ML injection 4 mg (4 mg Intravenous Given 10/16/19 1601)  ondansetron (ZOFRAN) injection 4 mg (4 mg Intravenous Given 10/16/19 1600)  iohexol (OMNIPAQUE) 350 MG/ML injection 75 mL (75 mLs Intravenous Contrast Given 10/16/19 1624)  cloNIDine (CATAPRES) tablet 0.1 mg (0.1 mg Oral Given 10/16/19 1805)    Pertinent labs & imaging results that were available during my care of the patient were reviewed by me and considered in my medical decision making (see chart for details).  Jessica Howell was evaluated in Emergency Department on 10/16/2019 for the symptoms described in the history of present illness. She was evaluated in the context of the global COVID-19 pandemic, which necessitated consideration that the patient might be at risk for infection with the SARS-CoV-2 virus that causes COVID-19. Institutional protocols and algorithms that pertain to the evaluation of patients at risk for COVID-19 are in a state of rapid change based on information released by regulatory bodies including the CDC and federal and state organizations.  These policies and algorithms were followed during the patient's care in the ED.     Clinical Course as of Oct 15 1921  Tue Oct 16, 2019  1559 Pt arrives in ED from [place], screaming from intense back pain radiating to ant. Abd and up to chest. Sudden onset. Will need to obtain CTA to r/o dissection, mesenteric ischemia, or acute abd organ infarct. Will give morphine 4mg  IV for acute pain control and ativan 0.5 mg IV for anxiolysis.   [PS]  1901 CT angiogram unremarkable.  Patient reports her symptoms have completely resolved.  Troponin change from 38-47, likely chronic baseline.  Doubt ACS, no evidence of dissection, doubt PE.  Unlikely myocarditis or pericarditis, no aneurysm or intra-abdominal pathology.  Stable for discharge home.  She is tolerating oral intake.  High blood pressure I think is due to essential hypertension.  Have given her additional dose of clonidine today and she will continue her home medications.   [PS]    Clinical Course User Index [PS] Carrie Mew, MD     ----------------------------------------- 7:23 PM on 10/16/2019 -----------------------------------------  Electronic medical record reviewed with care everywhere, noted in January 2020 her baseline troponin at [facility] appears to be about 40.  She also had free T4 checked which was normal.  Doubt thyroid storm, stable for discharge home.  Discussed with Dr. Janese Banks who will arrange additional follow-up.  ____________________________________________   FINAL CLINICAL IMPRESSION(S) / ED DIAGNOSES    Final diagnoses:  Acute midline low back pain without sciatica  Non-dose-related adverse reaction to medication, initial encounter  Hypertension, unspecified type  Anxiety     ED Discharge Orders    None      Portions of this note were generated with dragon dictation software. Dictation errors may occur despite best attempts at proofreading.   Carrie Mew, MD 10/16/19 1924

## 2019-10-16 NOTE — Progress Notes (Signed)
Ch arrived at [facility] in response to [place]. Pt was being wheeled to the ED at this time. Ch stayed with doctor while she made call to Pt's daughter, to inform about situation. [place] then went to the ED and went with RN to meet Pt's daughter. Ch stayed with Pt's daughter Jessica Howell in the ED waiting area to provide support. Jessica Howell shared having had many losses in the family recently including her 83 year old daughter-in-law. Jessica Howell is overwhelmed with taking care of young children and aging mother with health conditions. Ch escorted Jessica Howell to Pt's room. Pt was being taken for a test. Ch stayed and prayed with Jessica Howell, let her know about chaplain availability anytime and then left. Ch has informed on-call chaplain to follow-up on them when he can.

## 2019-10-16 NOTE — Discharge Instructions (Signed)
Your lab tests were all reassuring today, and your CT scan of the chest, abdomen, and pelvis did not show any acute issues related to your back pain.  This appears to be an adverse reaction to the IV iron medication, or possibly anxiety-related. Please continue taking all of your home medications and follow up with Dr. Janese Banks and Dr. Army Melia.

## 2019-10-16 NOTE — ED Notes (Signed)
Pt ambulated to bathroom with minimal assistance. Water given to pt.

## 2019-10-16 NOTE — Progress Notes (Signed)
Patient came in today as a new patient to see Dr. Janese Banks for anemia due to iron deficiency. Patient arrived to the infusion suite in the [facility] at approximately 1510 to receive her first dose of iv feraheme, per Dr. Elroy Channel request. Patient was placed in a recliner and 24g iv was started in her left hand at 1515,  with NS running at [facility]. Feraheme 510 mg in 176ml of normal saline was started at 1529. At 1531, patient stated that she was not feeling normal and clutched both arms to her chest. Feraheme was stopped immediately. Randa Evens, MD and Rulon Abide, NP were notified and both came to patient's chairside to evaluate patient. Normal saline was hung wide open via 24g iv catheter in left hand. Vitals were taken;  BP 112/99, pulse 117, and oxygen saturation 82% on room air. Oxygen was applied wide open via non re breather mask, and patient's oxygen saturation came up to 99 %.  Patient fell unconscious for approximately 10-15 seconds. Benadryl 50 mg iv was given, then patient became conscious. Solu Medrol 125 mg iv was given. Patient began yelling out in pain and stated that her chest and back were hurting . Patient was given Morphine 2mg  iv per Dr. Elroy Channel verbal order. Vitals were rechecked at 1540 and found to be elevated at 208/133 and pulse was 174; patient continued to yell out in pain and clutch her chest. Pepcid 20 mg iv hung at 1545 per Dr. Elroy Channel verbal order.  Per Dr. Janese Banks, code was called and code team arrived shortly thereafter, and transported patient to the ED via stretcher. Nurse remained with patient during transfer and gave handoff report to ED team. Patient's daughter was contacted by Dr. Janese Banks via telephone and updated on patient's condition. Nurse also spoke to patient's daughter in the ED and updated her on patient's condition at that time. Patient's daughter was instructed to call us if she needs anything further. She verbalized understanding.

## 2019-10-16 NOTE — ED Triage Notes (Signed)
Pt was getting venofer and had an allergic reaction. Pt stated that she felt weird and clutched her chest and passed out. Vitals abnormal.   125 solumedrol 50 benadryl 2 morphine given by Doni with the cancer center.

## 2019-10-16 NOTE — ED Provider Notes (Signed)
[place]  Department of Emergency Medicine   Code Blue CONSULT NOTE  Chief Complaint: tachycardia, severe back pain  Level V Caveat: patient unable to provide history due to excruciating pain  History of present illness: I was contacted by the hospital for a CODE BLUE cardiac arrest in the [place] and presented to the patient's bedside.   report given by Dr. Janese Banks who notes that pt has h/o atrial fibrillation, iron deficiency anemia and had come to cancer center today for iron infusion.  Immediately after the infusion was started, the patient reported feeling strange.  The nurse stopped the infusion right away and only a very small amount of IV iron was given.  However, the patient very rapidly developed severe sudden excruciating back pain radiating to the anterior abdomen and up to the chest.  Constant since onset, no aggravating or alleviating factors.  Dr. Janese Banks notes that heart rate was observed to increase as high as 180 prior to medications.  Blood pressure was also about 180/100.  She noted a transient decrease in O2 sat which normalized with supplemental O2.  [place] gave Solu-Medrol IV 2125 mg, Benadryl IV 50 mg, morphine IV 2 mg.  ROS: Unable to obtain, Level V caveat  Scheduled Meds: . LORazepam      . LORazepam  0.5 mg Intravenous Once  .  morphine injection  4 mg Intravenous Once  . ondansetron      . ondansetron (ZOFRAN) IV  4 mg Intravenous Once   Continuous Infusions: PRN Meds:. Past Medical History:  Diagnosis Date  . A-fib ([place])   . Allergy   . Anemia   . CHF (congestive heart failure) ([place])   . COPD (chronic obstructive pulmonary disease) ([place])   . Depression   . Feeling grief   . GERD (gastroesophageal reflux disease)   . Hyperlipidemia   . Hypertension   . Hypokalemia 09/06/2016   Last Assessment & Plan:  Formatting of this note is different from the original. Will continue to monitor; ordered labs as below. Lab Results   Component Value Date   K 3.2 (L) 04/12/2016   K 3.5 04/10/2016   K 4.2 04/09/2016   K 4.4 11/21/2013   K 4.4 01/03/2013  . Myocardial infarction ([place])   . Osteoarthritis   . Post-nasal drip 11/10/2015   Formatting of this note might be different from the original. Overview:   (Working Diagnosis)  Last Assessment & Plan:  Formatting of this note might be different from the original. Continue OTC flonase 2 sprays daily Likely contributing to cough.  Will see if flonase helps with cough and PND  . Right wrist pain 06/20/2015   Last Assessment & Plan:  Formatting of this note might be different from the original. Significant edema, and some grip and extension weakness, and persistent severe pain. She was not able to tolerate splint Reviewed xr of her recent ED visit No fractures identified (except old fracture on radial head)  +++ snuff box tenderness persists [facility]erred to [facility] Information provide  . Vitamin D deficiency    Past Surgical History:  Procedure Laterality Date  . ABDOMINAL HYSTERECTOMY    . BREAST SURGERY    . FOREARM SURGERY     Social History   Socioeconomic History  . Marital status: Widowed    Spouse name: Not on file  . Number of children: 1  . Years of education: Not on file  . Highest education level:  Not on file  Occupational History  . Occupation: retired  Tobacco Use  . Smoking status: Former Smoker    Packs/day: 0.50    Years: 45.00    Pack years: 22.50    Types: Cigarettes    Quit date: 06/10/2004    Years since quitting: 15.3  . Smokeless tobacco: Never Used  Vaping Use  . Vaping Use: Never used  Substance and Sexual Activity  . Alcohol use: Never  . Drug use: Not Currently  . Sexual activity: Not Currently  Other Topics Concern  . Not on file  Social History Narrative   Living alone. Husband passed away in 11/22/2018. Daughter checking in on her and great grandson checks in also.    Social Determinants of Health   Financial  Resource Strain: Low Risk   . Difficulty of Paying Living Expenses: Not very hard  Food Insecurity: No Food Insecurity  . Worried About Charity fundraiser in the Last Year: Never true  . Ran Out of Food in the Last Year: Never true  Transportation Needs: No Transportation Needs  . Lack of Transportation (Medical): No  . Lack of Transportation (Non-Medical): No  Physical Activity: Inactive  . Days of Exercise per Week: 0 days  . Minutes of Exercise per Session: 0 min  Stress: Stress Concern Present  . Feeling of Stress : Very much  Social Connections: Unknown  . Frequency of Communication with Friends and Family: Patient refused  . Frequency of Social Gatherings with Friends and Family: Patient refused  . Attends Religious Services: Patient refused  . Active Member of Clubs or Organizations: Patient refused  . Attends Archivist Meetings: Patient refused  . Marital Status: Widowed  Intimate Partner Violence: Not At Risk  . Fear of Current or Ex-Partner: No  . Emotionally Abused: No  . Physically Abused: No  . Sexually Abused: No   Allergies  Allergen Reactions  . Penicillins Anaphylaxis, Rash and Other (See Comments)  . Azithromycin Other (See Comments)    abd pain   . Gemfibrozil Nausea And Vomiting and Rash       . Solifenacin     Mild urinary retention  . Statins Nausea And Vomiting and Rash    Stomach pain      Last set of Vital Signs (not current) There were no vitals filed for this visit.    Physical Exam  Gen: screaming in pain Cardiovascular: normal radial pulse Resp: forceful voice, good air movement Abd: nondistended  Neuro: GCS 14 HEENT: unremarkable Musculoskeletal: No deformity  Skin: warm   Medical Decision making  Currently hemodynamically stable, not requiring ACLS resuscitation onsite.  Assessment and Plan  Proceed to ED for further work-up and evaluation  Patient accompanied by myself and ED nurse Marya Amsler throughout transport  from cancer center to ED bed 3.    Carrie Mew, MD 10/16/19 1610

## 2019-10-16 NOTE — Progress Notes (Signed)
Hematology/Oncology Consult note [facility] Telephone:([phone] [phone]  Patient Care Team: Glean Hess, MD as PCP - General (Internal Medicine) Dr. Pasty Arch (Psychiatry)   Name of the patient: Trenita Hulme  [id number]  June 27, 1934    Reason for referral-iron deficiency anemia   Referring physician-Dr. Bonna Gains  Date of visit: 10/16/19   History of presenting illness- Patient is a 85 year old female with a past medical history significant for coronary artery disease, hypertension, COPD, depression among other medical problems.  She has been referred to [place] for iron deficiency anemia.  Most recent CBC from 10/09/2019 showed white count of 6.4, H&H of 7.2/23.6 with an MCV of 73 and a platelet count which was inaccurate due to aggregation of platelets.  Her prior hemoglobin from February was normal at 14.6.  We do not have any recent iron studies.  Patient was seen by Dr. Bonna Gains for symptoms of dysphagia plan is to proceed with EGD first and if negative proceed with colonoscopy.  Patient currently reports fatigue and exertional shortness of breath.  She also has ongoing cognitive issues.  She is here with her daughter today.    ECOG PS- 2  Pain scale- 0   Review of systems- Review of Systems  Constitutional: Positive for malaise/fatigue. Negative for chills, fever and weight loss.  HENT: Negative for congestion, ear discharge and nosebleeds.   Eyes: Negative for blurred vision.  Respiratory: Positive for shortness of breath. Negative for cough, hemoptysis, sputum production and wheezing.   Cardiovascular: Negative for chest pain, palpitations, orthopnea and claudication.  Gastrointestinal: Negative for abdominal pain, blood in stool, constipation, diarrhea, heartburn, melena, nausea and vomiting.  Genitourinary: Negative for dysuria, flank pain, frequency, hematuria and urgency.  Musculoskeletal: Negative for back pain, joint pain  and myalgias.  Skin: Negative for rash.  Neurological: Negative for dizziness, tingling, focal weakness, seizures, weakness and headaches.  Endo/Heme/Allergies: Does not bruise/bleed easily.  Psychiatric/Behavioral: Negative for depression and suicidal ideas. The patient does not have insomnia.     Allergies  Allergen Reactions  . Penicillins Anaphylaxis, Rash and Other (See Comments)  . Azithromycin Other (See Comments)    abd pain   . Gemfibrozil Nausea And Vomiting and Rash       . Solifenacin     Mild urinary retention  . Statins Nausea And Vomiting and Rash    Stomach pain      Patient Active Problem List   Diagnosis Date Noted  . Acquired thrombophilia ([place]) 10/09/2019  . Senile osteoporosis 06/11/2019  . Current severe episode of major depressive disorder without psychotic features ([place]) 06/11/2019  . Myalgia due to statin 06/11/2019  . Esophageal dysphagia 05/03/2018  . Risk for falls 03/21/2018  . Iron deficiency anemia 03/18/2018  . Weakness of both legs 10/28/2017  . Other age-related cataract 05/31/2016  . Paroxysmal atrial fibrillation ([place]) 04/15/2016  . Skin lesion of breast 11/21/2013  . CHF (congestive heart failure) ([place]) 10/16/2013  . Other seborrheic keratosis 01/03/2013  . Coronary atherosclerosis of native coronary artery 01/03/2013  . Vitamin D deficiency 12/19/2012  . Mixed hyperlipidemia 12/19/2012  . Essential (primary) hypertension 12/19/2012  . Chronic obstructive pulmonary disease, unspecified ([place]) 12/19/2012  . Generalized osteoarthrosis, involving multiple sites 12/19/2012  . Urinary incontinence 12/19/2012     Past Medical History:  Diagnosis Date  . A-fib ([place])   . Allergy   . CHF (congestive heart failure) ([place])   . COPD (chronic obstructive pulmonary disease) ([place])   .  Depression   . Feeling grief   . GERD (gastroesophageal reflux disease)   . Hyperlipidemia   . Hypertension   . Hypokalemia 09/06/2016   Last Assessment &  Plan:  Formatting of this note is different from the original. Will continue to monitor; ordered labs as below. Lab Results  Component Value Date   K 3.2 (L) 04/12/2016   K 3.5 04/10/2016   K 4.2 04/09/2016   K 4.4 11/21/2013   K 4.4 01/03/2013  . Osteoarthritis   . Post-nasal drip 11/10/2015   Formatting of this note might be different from the original. Overview:   (Working Diagnosis)  Last Assessment & Plan:  Formatting of this note might be different from the original. Continue OTC flonase 2 sprays daily Likely contributing to cough.  Will see if flonase helps with cough and PND  . Right wrist pain 06/20/2015   Last Assessment & Plan:  Formatting of this note might be different from the original. Significant edema, and some grip and extension weakness, and persistent severe pain. She was not able to tolerate splint Reviewed xr of her recent ED visit No fractures identified (except old fracture on radial head)  +++ snuff box tenderness persists [facility]erred to [facility] Information provide  . Vitamin D deficiency      Past Surgical History:  Procedure Laterality Date  . ABDOMINAL HYSTERECTOMY    . BREAST SURGERY    . FOREARM SURGERY      Social History   Socioeconomic History  . Marital status: Widowed    Spouse name: Not on file  . Number of children: 1  . Years of education: Not on file  . Highest education level: Not on file  Occupational History  . Occupation: retired  Tobacco Use  . Smoking status: Former Smoker    Packs/day: 0.50    Years: 45.00    Pack years: 22.50    Types: Cigarettes    Quit date: 06/10/2004    Years since quitting: 15.3  . Smokeless tobacco: Never Used  Vaping Use  . Vaping Use: Never used  Substance and Sexual Activity  . Alcohol use: Not Currently  . Drug use: Not Currently  . Sexual activity: Not on file  Other Topics Concern  . Not on file  Social History Narrative   Living alone. Husband passed away in 11-25-18. Daughter  checking in on her and great grandson checks in also.    Social Determinants of Health   Financial Resource Strain: Low Risk   . Difficulty of Paying Living Expenses: Not very hard  Food Insecurity: No Food Insecurity  . Worried About Charity fundraiser in the Last Year: Never true  . Ran Out of Food in the Last Year: Never true  Transportation Needs: No Transportation Needs  . Lack of Transportation (Medical): No  . Lack of Transportation (Non-Medical): No  Physical Activity: Inactive  . Days of Exercise per Week: 0 days  . Minutes of Exercise per Session: 0 min  Stress: Stress Concern Present  . Feeling of Stress : Very much  Social Connections: Unknown  . Frequency of Communication with Friends and Family: Patient refused  . Frequency of Social Gatherings with Friends and Family: Patient refused  . Attends Religious Services: Patient refused  . Active Member of Clubs or Organizations: Patient refused  . Attends Archivist Meetings: Patient refused  . Marital Status: Widowed  Intimate Partner Violence: Not At Risk  .  Fear of Current or Ex-Partner: No  . Emotionally Abused: No  . Physically Abused: No  . Sexually Abused: No     Family History  Problem Relation Age of Onset  . Diabetes Mother   . Hypertension Mother   . Cancer Father        esoph.   Marland Kitchen COPD Sister   . Heart disease Brother   . Early death Son      Current Outpatient Medications:  .  albuterol (VENTOLIN HFA) 108 (90 Base) MCG/ACT inhaler, ventolin hfa 108 (90 base) mcg/actaers, Disp: , Rfl:  .  ALPRAZolam (XANAX) 0.5 MG tablet, Take 0.5 mg by mouth 3 (three) times daily as needed. , Disp: , Rfl:  .  Cholecalciferol (VITAMIN D) 125 MCG (5000 UT) CAPS, Take by mouth., Disp: , Rfl:  .  cloNIDine (CATAPRES) 0.2 MG tablet, , Disp: , Rfl:  .  Fluticasone-Salmeterol (ADVAIR DISKUS) 250-50 MCG/DOSE AEPB, Inhale into the lungs., Disp: , Rfl:  .  furosemide (LASIX) 40 MG tablet, Take 1 tablet by mouth  in the morning and at bedtime., Disp: , Rfl:  .  Melatonin 5 MG CHEW, Chew 10 mg by mouth. , Disp: , Rfl:  .  Metoprolol Succinate 200 MG CS24, Take 200 mg by mouth daily., Disp: , Rfl:  .  montelukast (SINGULAIR) 10 MG tablet, Take by mouth at bedtime. , Disp: , Rfl:  .  nitroGLYCERIN (NITROSTAT) 0.4 MG SL tablet, Place 1 tablet (0.4 mg total) under the tongue every 5 (five) minutes as needed for chest pain., Disp: 30 tablet, Rfl: 1 .  omeprazole (PRILOSEC) 20 MG capsule, TAKE 1 CAPSULE (20 MG TOTAL) BY MOUTH 2 (TWO) TIMES DAILY BEFORE A MEAL., Disp: 180 capsule, Rfl: 1 .  potassium chloride (KLOR-CON) 10 MEQ tablet, , Disp: , Rfl:  .  ramipril (ALTACE) 10 MG capsule, Take 10 mg by mouth 2 (two) times daily. , Disp: , Rfl:  .  Rivaroxaban (XARELTO) 15 MG TABS tablet, Take by mouth. (Patient not taking: Reported on 10/11/2019), Disp: , Rfl:  .  temazepam (RESTORIL) 30 MG capsule, temazepam 30 mg caps, Disp: , Rfl:  .  traMADol (ULTRAM) 50 MG tablet, Take by mouth every 6 (six) hours as needed., Disp: , Rfl:  .  vitamin E 180 MG (400 UNITS) capsule, Take by mouth., Disp: , Rfl:    Physical exam:  Vitals:   10/16/19 1336 10/16/19 1349  BP: (!) 191/69   Pulse: 84   Resp: 16   Temp: 99.1 F (37.3 C)   TempSrc: Oral   SpO2:  96%  Weight: 143 lb 6.4 oz (65 kg)    Physical Exam Constitutional:      Comments: Thin frail woman in no acute distress  Cardiovascular:     Rate and Rhythm: Normal rate and regular rhythm.     Heart sounds: Normal heart sounds.  Pulmonary:     Effort: Pulmonary effort is normal.     Breath sounds: Normal breath sounds.  Abdominal:     General: Bowel sounds are normal.     Palpations: Abdomen is soft.  Skin:    General: Skin is warm and dry.  Neurological:     Mental Status: She is alert and oriented to person, place, and time.        CMP Latest Ref Rng & Units 10/09/2019  Glucose 65 - 99 mg/dL 119(H)  BUN 8 - 27 mg/dL 17  Creatinine 0.57 - 1.00 mg/dL  1.33(H)  Sodium 134 - 144 mmol/L 137  Potassium 3.5 - 5.2 mmol/L 4.0  Chloride 96 - 106 mmol/L 101  CO2 20 - 29 mmol/L 21  Calcium 8.7 - 10.3 mg/dL 9.1  Total Protein 6.0 - 8.5 g/dL 6.5  Total Bilirubin 0.0 - 1.2 mg/dL 0.3  Alkaline Phos 48 - 121 IU/L 70  AST 0 - 40 IU/L 24  ALT 0 - 32 IU/L 17   CBC Latest Ref Rng & Units 10/09/2019  WBC 3.4 - 10.8 x10E3/uL 6.4  Hemoglobin 11.1 - 15.9 g/dL 7.2(L)  Hematocrit 34.0 - 46.6 % 23.6(L)  Platelets x10E3/uL CANCELED     Assessment and plan- Patient is a 85 y.o. female referred for microcytic anemia  Microcytic anemia likely secondary to iron deficiency although we do not have any recent iron studies.  Patient's hemoglobin was normal in February 2021 at 14 and is down to 7.2.  She will be undergoing GI evaluation with Dr. Leta Baptist.  Today I will check a complete anemia work-up including CBC with differential, CMP, ferritin and iron studies, B12 and folate, reticulocyte count, haptoglobin and myeloma panel.   Insurance is okay with Feraheme and she would need 2 doses 510 mg IV weekly x2.  First dose to be given today.  Discussed risks and benefits of IV iron including all but not limited to headache, leg swelling and possible risk of infusion reaction.  Patient understands and agrees to proceed as planned.  I will see her back in 2 months with repeat CBC ferritin and iron studies   Thank you for this kind referral and the opportunity to participate in the care of this patient   Visit Diagnosis 1. Microcytic anemia     Dr. Randa Evens, MD, MPH [facility] at [facility] [phone] 10/16/2019  Addendum: Patient had barely received a few mL of Feraheme in the infusion area when she started reporting symptoms of significant chest pain and back pain and started screaming uncontrollably.  Her blood pressure went up from 1 90-2 40.  She transiently dropped her oxygen saturations in the 80s but it came back up to 99% on oxygen.   She was responsive and awake throughout and did not require any cardiac resuscitation.  Patient was given Benadryl and Solu-Medrol and IV fluids.  She was also given 2 mg of IV morphine for her ongoing back pain.  Cardiac monitor did not show any evidence of ST segment elevations.  CODE BLUE was called but patient never really went into cardiac arrest and was transported to the ER  In the future if patient needs IV iron we will give her Venofer instead of Feraheme  Dr. Randa Evens, MD, MPH [facility] at [facility] [phone] 10/19/2019 2:44 PM

## 2019-10-17 ENCOUNTER — Emergency Department: Payer: Medicare HMO

## 2019-10-17 ENCOUNTER — Other Ambulatory Visit: Payer: Self-pay

## 2019-10-17 ENCOUNTER — Encounter: Payer: Self-pay | Admitting: Emergency Medicine

## 2019-10-17 ENCOUNTER — Ambulatory Visit (INDEPENDENT_AMBULATORY_CARE_PROVIDER_SITE_OTHER)
Admission: EM | Admit: 2019-10-17 | Discharge: 2019-10-17 | Disposition: A | Discharge status: Home / Self Care | Payer: Medicare HMO | Source: Home / Self Care | Attending: Emergency Medicine | Admitting: Emergency Medicine

## 2019-10-17 DIAGNOSIS — R933 Abnormal findings on diagnostic imaging of other parts of digestive tract: Secondary | ICD-10-CM | POA: Diagnosis present

## 2019-10-17 DIAGNOSIS — I160 Hypertensive urgency: Secondary | ICD-10-CM | POA: Diagnosis present

## 2019-10-17 DIAGNOSIS — E870 Hyperosmolality and hypernatremia: Secondary | ICD-10-CM | POA: Diagnosis present

## 2019-10-17 DIAGNOSIS — Z20822 Contact with and (suspected) exposure to covid-19: Secondary | ICD-10-CM | POA: Diagnosis present

## 2019-10-17 DIAGNOSIS — G8929 Other chronic pain: Secondary | ICD-10-CM | POA: Diagnosis present

## 2019-10-17 DIAGNOSIS — F329 Major depressive disorder, single episode, unspecified: Secondary | ICD-10-CM | POA: Diagnosis present

## 2019-10-17 DIAGNOSIS — N179 Acute kidney failure, unspecified: Secondary | ICD-10-CM | POA: Diagnosis present

## 2019-10-17 DIAGNOSIS — R0789 Other chest pain: Secondary | ICD-10-CM | POA: Diagnosis not present

## 2019-10-17 DIAGNOSIS — M1990 Unspecified osteoarthritis, unspecified site: Secondary | ICD-10-CM | POA: Diagnosis present

## 2019-10-17 DIAGNOSIS — D509 Iron deficiency anemia, unspecified: Principal | ICD-10-CM | POA: Diagnosis present

## 2019-10-17 DIAGNOSIS — I5033 Acute on chronic diastolic (congestive) heart failure: Secondary | ICD-10-CM | POA: Diagnosis present

## 2019-10-17 DIAGNOSIS — E871 Hypo-osmolality and hyponatremia: Secondary | ICD-10-CM | POA: Diagnosis present

## 2019-10-17 DIAGNOSIS — I248 Other forms of acute ischemic heart disease: Secondary | ICD-10-CM | POA: Diagnosis present

## 2019-10-17 DIAGNOSIS — E041 Nontoxic single thyroid nodule: Secondary | ICD-10-CM | POA: Diagnosis present

## 2019-10-17 DIAGNOSIS — Z7951 Long term (current) use of inhaled steroids: Secondary | ICD-10-CM

## 2019-10-17 DIAGNOSIS — M546 Pain in thoracic spine: Secondary | ICD-10-CM | POA: Diagnosis present

## 2019-10-17 DIAGNOSIS — Z79899 Other long term (current) drug therapy: Secondary | ICD-10-CM

## 2019-10-17 DIAGNOSIS — N183 Chronic kidney disease, stage 3 unspecified: Secondary | ICD-10-CM | POA: Diagnosis present

## 2019-10-17 DIAGNOSIS — R1319 Other dysphagia: Secondary | ICD-10-CM | POA: Diagnosis present

## 2019-10-17 DIAGNOSIS — Z87891 Personal history of nicotine dependence: Secondary | ICD-10-CM

## 2019-10-17 DIAGNOSIS — J449 Chronic obstructive pulmonary disease, unspecified: Secondary | ICD-10-CM | POA: Diagnosis present

## 2019-10-17 DIAGNOSIS — Z66 Do not resuscitate: Secondary | ICD-10-CM | POA: Diagnosis present

## 2019-10-17 DIAGNOSIS — R0602 Shortness of breath: Secondary | ICD-10-CM | POA: Diagnosis not present

## 2019-10-17 DIAGNOSIS — E785 Hyperlipidemia, unspecified: Secondary | ICD-10-CM | POA: Diagnosis present

## 2019-10-17 DIAGNOSIS — I252 Old myocardial infarction: Secondary | ICD-10-CM

## 2019-10-17 DIAGNOSIS — F419 Anxiety disorder, unspecified: Secondary | ICD-10-CM | POA: Diagnosis present

## 2019-10-17 DIAGNOSIS — K635 Polyp of colon: Secondary | ICD-10-CM | POA: Diagnosis present

## 2019-10-17 DIAGNOSIS — Z7901 Long term (current) use of anticoagulants: Secondary | ICD-10-CM

## 2019-10-17 DIAGNOSIS — I480 Paroxysmal atrial fibrillation: Secondary | ICD-10-CM | POA: Diagnosis present

## 2019-10-17 DIAGNOSIS — I130 Hypertensive heart and chronic kidney disease with heart failure and stage 1 through stage 4 chronic kidney disease, or unspecified chronic kidney disease: Secondary | ICD-10-CM | POA: Diagnosis present

## 2019-10-17 DIAGNOSIS — M810 Age-related osteoporosis without current pathological fracture: Secondary | ICD-10-CM | POA: Diagnosis present

## 2019-10-17 DIAGNOSIS — M549 Dorsalgia, unspecified: Secondary | ICD-10-CM | POA: Diagnosis present

## 2019-10-17 DIAGNOSIS — Z881 Allergy status to other antibiotic agents status: Secondary | ICD-10-CM

## 2019-10-17 DIAGNOSIS — Z888 Allergy status to other drugs, medicaments and biological substances status: Secondary | ICD-10-CM

## 2019-10-17 DIAGNOSIS — Z8249 Family history of ischemic heart disease and other diseases of the circulatory system: Secondary | ICD-10-CM

## 2019-10-17 DIAGNOSIS — R000 Tachycardia, unspecified: Secondary | ICD-10-CM | POA: Diagnosis present

## 2019-10-17 DIAGNOSIS — K219 Gastro-esophageal reflux disease without esophagitis: Secondary | ICD-10-CM | POA: Diagnosis present

## 2019-10-17 DIAGNOSIS — I2510 Atherosclerotic heart disease of native coronary artery without angina pectoris: Secondary | ICD-10-CM | POA: Diagnosis present

## 2019-10-17 DIAGNOSIS — Z880 Allergy status to penicillin: Secondary | ICD-10-CM

## 2019-10-17 DIAGNOSIS — R911 Solitary pulmonary nodule: Secondary | ICD-10-CM | POA: Diagnosis present

## 2019-10-17 LAB — COMPREHENSIVE METABOLIC PANEL
ALT: 20 U/L (ref 0–44)
AST: 27 U/L (ref 15–41)
Albumin: 3.7 g/dL (ref 3.5–5.0)
Alkaline Phosphatase: 53 U/L (ref 38–126)
Anion gap: 9 (ref 5–15)
BUN: 22 mg/dL (ref 8–23)
CO2: 23 mmol/L (ref 22–32)
Calcium: 8.7 mg/dL — ABNORMAL LOW (ref 8.9–10.3)
Chloride: 95 mmol/L — ABNORMAL LOW (ref 98–111)
Creatinine, Ser: 1.51 mg/dL — ABNORMAL HIGH (ref 0.44–1.00)
GFR calc Af Amer: 37 mL/min — ABNORMAL LOW (ref >60)
GFR calc non Af Amer: 32 mL/min — ABNORMAL LOW (ref >60)
Glucose, Bld: 111 mg/dL — ABNORMAL HIGH (ref 70–99)
Potassium: 4.4 mmol/L (ref 3.5–5.1)
Sodium: 127 mmol/L — ABNORMAL LOW (ref 135–145)
Total Bilirubin: 0.6 mg/dL (ref 0.3–1.2)
Total Protein: 6.8 g/dL (ref 6.5–8.1)

## 2019-10-17 LAB — CBC
HCT: 21.3 % — ABNORMAL LOW (ref 36.0–46.0)
Hemoglobin: 6.1 g/dL — ABNORMAL LOW (ref 12.0–15.0)
MCH: 20.6 pg — ABNORMAL LOW (ref 26.0–34.0)
MCHC: 28.6 g/dL — ABNORMAL LOW (ref 30.0–36.0)
MCV: 72 fL — ABNORMAL LOW (ref 80.0–100.0)
Platelets: UNDETERMINED 10*3/uL (ref 150–400)
RBC: 2.96 MIL/uL — ABNORMAL LOW (ref 3.87–5.11)
RDW: 15.9 % — ABNORMAL HIGH (ref 11.5–15.5)
WBC: 13.9 10*3/uL — ABNORMAL HIGH (ref 4.0–10.5)
nRBC: 0.2 % (ref 0.0–0.2)

## 2019-10-17 LAB — HAPTOGLOBIN: Haptoglobin: 188 mg/dL (ref 41–333)

## 2019-10-17 LAB — TROPONIN I (HIGH SENSITIVITY)
Troponin I (High Sensitivity): 63 ng/L — ABNORMAL HIGH (ref <18)
Troponin I (High Sensitivity): 67 ng/L — ABNORMAL HIGH (ref <18)

## 2019-10-17 NOTE — Discharge Instructions (Addendum)
I am concerned that this could be your heart.  I am concerned that this is unstable angina.  You need to go immediately to the [facility] emergency department for further investigation.  I am trusting you that you will go.

## 2019-10-17 NOTE — ED Triage Notes (Addendum)
Patient c/o shortness of breath and dizziness that started this morning. She states this comes and goes and she has had this before.  Patient reports being taken off of Xarelto a few days ago.

## 2019-10-17 NOTE — ED Provider Notes (Signed)
HPI  SUBJECTIVE:  Jessica Howell is a 85 y.o. female who presents with episodes of shortness of breath described as "attacks" accompanied with dizziness described as off-balance, feeling lightheaded.  This is sometimes accompanied with nausea and substernal chest pressure.  They are intermittent, lasting up to 30 minutes.  She has been getting these for "years" and is no more severe than usual, but states that has been coming more frequently, daily over the past week, and was lasting longer than usual today. She has had a cough for several years, no increase in the amount of sputum or change in color.  She is not sure if she had any chest pain or pressure today.  She states that she had substernal chest pain described as heaviness 1 week ago with this shortness of breath which resolved with 4 nitroglycerin.  She did not take any nitroglycerin today.  She used her rescue albuterol inhaler with improvement in her shortness of breath today.  This seems to be brought on by exertion.  Patient states that "she never knows when she is going to have an attack".  She states that she was concerned that she was going to pass out and die if her symptoms got any worse.  She reports DOE to 50 feet, but this has not changed recently.  She denies wheezing, syncope, presyncope.  No diaphoresis, palpitations.  She was seen by cardiology on 6/18 for exertional chest pain/shortness of breath which was concerning for angina.  Plan was to get a myocardial perfusion stress study for shortness of breath and angina, Holter monitor and echocardiogram  Patient was seen in the ED yesterday from the cancer center for severe mid back pain radiating to the abdomen and chest starting after an iron infusion. CT was negative for dissection, thoracic or abdominal aneurysm,  splenic, renal infarct, mesenteric ischemia.  Pulmonary arteries were not completely visualized due to timing of the contrast and suboptimal opacification.  Troponins were  elevated, however this was thought to be chronic.  Baseline troponin at [facility] about 40 in January 2020.  ACS, PE, myocarditis, pericarditis thought unlikely.  She has a past medical history of CHF, COPD, hypertension, coronary artery disease, MI, atrial fibrillation, no longer on Eliquis due to concerns for bleeding, anemia, GERD, hypercholesterolemia, chronic kidney disease stage III.  No history of diabetes.  PMD: Dr. Army Melia.  Cardiology: Dr. Nehemiah Massed.  Past Medical History:  Diagnosis Date  . A-fib ([place])   . Allergy   . Anemia   . CHF (congestive heart failure) ([place])   . COPD (chronic obstructive pulmonary disease) ([place])   . Depression   . Feeling grief   . GERD (gastroesophageal reflux disease)   . Hyperlipidemia   . Hypertension   . Hypokalemia 09/06/2016   Last Assessment & Plan:  Formatting of this note is different from the original. Will continue to monitor; ordered labs as below. Lab Results  Component Value Date   K 3.2 (L) 04/12/2016   K 3.5 04/10/2016   K 4.2 04/09/2016   K 4.4 11/21/2013   K 4.4 01/03/2013  . Myocardial infarction ([place])   . Osteoarthritis   . Post-nasal drip 11/10/2015   Formatting of this note might be different from the original. Overview:   (Working Diagnosis)  Last Assessment & Plan:  Formatting of this note might be different from the original. Continue OTC flonase 2 sprays daily Likely contributing to cough.  Will see if flonase helps with cough and PND  .  Right wrist pain 06/20/2015   Last Assessment & Plan:  Formatting of this note might be different from the original. Significant edema, and some grip and extension weakness, and persistent severe pain. She was not able to tolerate splint Reviewed xr of her recent ED visit No fractures identified (except old fracture on radial head)  +++ snuff box tenderness persists [facility] Information provide  . Vitamin D deficiency     Past Surgical History:  Procedure Laterality Date   . ABDOMINAL HYSTERECTOMY    . BREAST SURGERY    . FOREARM SURGERY      Family History  Problem Relation Age of Onset  . Diabetes Mother   . Hypertension Mother   . Cancer Father        esoph.   Marland Kitchen COPD Sister   . Heart disease Brother   . Early death Son     Social History   Tobacco Use  . Smoking status: Former Smoker    Packs/day: 0.50    Years: 45.00    Pack years: 22.50    Types: Cigarettes    Quit date: 06/10/2004    Years since quitting: 15.3  . Smokeless tobacco: Never Used  Vaping Use  . Vaping Use: Never used  Substance Use Topics  . Alcohol use: Never  . Drug use: Not Currently    No current facility-administered medications for this encounter.  Current Outpatient Medications:  .  albuterol (VENTOLIN HFA) 108 (90 Base) MCG/ACT inhaler, every 6 (six) hours as needed. , Disp: , Rfl:  .  ALPRAZolam (XANAX) 0.5 MG tablet, Take 0.5 mg by mouth 3 (three) times daily as needed. , Disp: , Rfl:  .  Cholecalciferol (VITAMIN D) 125 MCG (5000 UT) CAPS, Take 1 capsule by mouth daily. , Disp: , Rfl:  .  cloNIDine (CATAPRES) 0.2 MG tablet, , Disp: , Rfl:  .  furosemide (LASIX) 40 MG tablet, Take 1 tablet by mouth in the morning and at bedtime., Disp: , Rfl:  .  Melatonin 5 MG CHEW, Chew 10 mg by mouth. , Disp: , Rfl:  .  Metoprolol Succinate 200 MG CS24, Take 200 mg by mouth daily., Disp: , Rfl:  .  montelukast (SINGULAIR) 10 MG tablet, Take by mouth at bedtime. , Disp: , Rfl:  .  nitroGLYCERIN (NITROSTAT) 0.4 MG SL tablet, Place 1 tablet (0.4 mg total) under the tongue every 5 (five) minutes as needed for chest pain., Disp: 30 tablet, Rfl: 1 .  omeprazole (PRILOSEC) 20 MG capsule, TAKE 1 CAPSULE (20 MG TOTAL) BY MOUTH 2 (TWO) TIMES DAILY BEFORE A MEAL., Disp: 180 capsule, Rfl: 1 .  potassium chloride (KLOR-CON) 10 MEQ tablet, Take 10 mEq by mouth 2 (two) times daily. , Disp: , Rfl:  .  ramipril (ALTACE) 10 MG capsule, Take 10 mg by mouth 2 (two) times daily. , Disp: , Rfl:   .  temazepam (RESTORIL) 30 MG capsule, temazepam 30 mg caps, Disp: , Rfl:  .  traMADol (ULTRAM) 50 MG tablet, Take by mouth every 6 (six) hours as needed., Disp: , Rfl:  .  vitamin E 180 MG (400 UNITS) capsule, Take 400 Units by mouth daily. , Disp: , Rfl:  .  Fluticasone-Salmeterol (ADVAIR DISKUS) 250-50 MCG/DOSE AEPB, Inhale into the lungs., Disp: , Rfl:   Allergies  Allergen Reactions  . Penicillins Anaphylaxis, Rash and Other (See Comments)  . Iron Other (See Comments)    syncope  . Azithromycin  Other (See Comments)    abd pain   . Gemfibrozil Nausea And Vomiting and Rash       . Solifenacin     Mild urinary retention  . Statins Nausea And Vomiting and Rash    Stomach pain       ROS  As noted in HPI.   Physical Exam  BP (!) 150/67 (BP Location: Left Arm)   Pulse 79   Temp 98.6 F (37 C) (Oral)   Resp 18   Ht 5\' 4"  (1.626 m)   Wt 65 kg   SpO2 100%   BMI 24.60 kg/m   Constitutional: Well developed, well nourished, no acute distress Eyes:  EOMI, conjunctiva normal bilaterally HENT: Normocephalic, atraumatic,mucus membranes moist Respiratory: Normal inspiratory effort, lungs clear bilaterally, fair air movement. Cardiovascular: Normal rate regular rhythm no murmurs rubs or gallops GI: nondistended skin: No rash, skin intact Musculoskeletal: no deformities, no lower extremity edema Neurologic: Alert & oriented x 3, no focal neuro deficits Psychiatric: Speech and behavior appropriate   ED Course   Medications - No data to display  Orders Placed This Encounter  Procedures  . ED EKG    Standing Status:   Standing    Number of Occurrences:   1    Order Specific Question:   Reason for Exam    Answer:   Shortness of breath    Results for orders placed or performed during the hospital encounter of 10/16/19 (from the past 24 hour(s))  Troponin I (High Sensitivity)     Status: Abnormal   Collection Time: 10/16/19  6:02 PM  Result Value Ref Range    Troponin I (High Sensitivity) 47 (H) <18 ng/L   CT Angio Chest/Abd/Pel for Dissection W and/or Wo Contrast  Result Date: 10/16/2019 CLINICAL DATA:  85 year old female with chest pain. Concern for acute aortic syndrome. EXAM: CT ANGIOGRAPHY CHEST, ABDOMEN AND PELVIS TECHNIQUE: Non-contrast CT of the chest was initially obtained. Multidetector CT imaging through the chest, abdomen and pelvis was performed using the standard protocol during bolus administration of intravenous contrast. Multiplanar reconstructed images and MIPs were obtained and reviewed to evaluate the vascular anatomy. CONTRAST:  44mL OMNIPAQUE IOHEXOL 350 MG/ML SOLN COMPARISON:  None. FINDINGS: CTA CHEST FINDINGS Cardiovascular: There is mild cardiomegaly. No pericardial effusion. Advanced 3 vessel coronary vascular calcification. There is advanced atherosclerotic calcification of the thoracic aorta. No aneurysmal dilatation or dissection. The origins of the great vessels of the aortic arch appear patent as visualized. There is mild dilatation of main pulmonary trunk suggestive of pulmonary hypertension. Clinical correlation is recommended. Evaluation of the pulmonary arteries is limited due to suboptimal opacification and timing of the contrast. The central pulmonary arteries appear patent as visualized. Mediastinum/Nodes: Mildly enlarged right hilar lymph node measures 16 cm in short axis. No mediastinal adenopathy. The esophagus is grossly unremarkable. There is a 2 cm heterogeneous right thyroid nodule. Recommend thyroid US (ref: J Am Coll Radiol. 2015 Feb;12(2): 143-50).No mediastinal fluid collection. Lungs/Pleura: There is a 1 cm nodule along the superior aspect of the right major fissure. No focal consolidation, pleural effusion, pneumothorax. The central airways are patent. Musculoskeletal: Mild degenerative changes of the spine as well as arthritic changes of the shoulders. No acute osseous pathology. Review of the MIP images confirms  the above findings. CTA ABDOMEN AND PELVIS FINDINGS VASCULAR Aorta: Advanced atherosclerotic calcification. No dissection or aneurysm. Celiac: There is atherosclerotic calcification of the origin of the celiac axis. The celiac artery and its main branches  are patent. SMA: Atherosclerotic calcification of the proximal SMA. The SMA is patent. Renals: Atherosclerotic calcification of the renal arteries. The renal arteries remain patent. And accessory left renal artery noted. IMA: The IMA is patent. Inflow: Advanced atherosclerotic calcification of the iliac arteries. The iliac arteries remain patent. Veins: No obvious venous abnormality within the limitations of this arterial phase study. Review of the MIP images confirms the above findings. NON-VASCULAR No intra-abdominal free air or free fluid. Hepatobiliary: No focal liver abnormality is seen. No gallstones, gallbladder wall thickening, or biliary dilatation. Pancreas: Unremarkable. No pancreatic ductal dilatation or surrounding inflammatory changes. Spleen: Normal in size without focal abnormality. Adrenals/Urinary Tract: The adrenal glands unremarkable. There is no hydronephrosis on either side. There is thank more atrophy and cortical scarring of the inferior pole of the right kidney. Several left renal cysts measure up to 2.5 cm. Subcentimeter right renal hypodense lesion is too small to characterize. This can be better evaluated with ultrasound on a nonemergent/outpatient basis. The visualized ureters and urinary bladder appear unremarkable. Stomach/Bowel: There is no bowel obstruction or active inflammation. The appendix is normal. Lymphatic: No adenopathy. Reproductive: Hysterectomy. Other: A 3.7 x 2.1 cm chronic appearing complex collection in the subcutaneous soft tissues of the left gluteal region. Additional fluid collection also noted more inferiorly. There is subcutaneous dystrophic calcification. A partially visualized larger fluid collection measuring  at least 6.5 cm in the subcutaneous soft tissues of the right buttock. Musculoskeletal: Osteopenia. No acute osseous pathology. Review of the MIP images confirms the above findings. IMPRESSION: 1. No acute intrathoracic, abdominal, or pelvic pathology. No CT evidence of aortic dissection or aneurysm. 2. A 1 cm right upper lobe nodule along the major fissure. Consider one of the following in 3 months for both low-risk and high-risk individuals: (a) repeat chest CT, (b) follow-up PET-CT, or (c) tissue sampling. This recommendation follows the consensus statement: Guidelines for Management of Incidental Pulmonary Nodules Detected on CT Images: From the Fleischner Society 2017; Radiology 2017; 284:228-243. 3. Mild cardiomegaly with advanced 3 vessel coronary vascular calcification. 4. Mildly enlarged right hilar lymph node, nonspecific. Clinical correlation is recommended. 5. A 2 cm heterogeneous right thyroid nodule. Recommend thyroid US (ref: J Am Coll Radiol. 6. Aortic Atherosclerosis (ICD10-I70.0). Electronically Signed   By: Anner Crete M.D.   On: 10/16/2019 17:09    ED Clinical Impression  1. Shortness of breath   2. Chest pressure      ED Assessment/Plan  ER records, previous EKG, labs imaging reports extensively reviewed.  As noted in HPI.  Patient is a challenging historian, additional history obtained from daughter and outside records  EKG: Normal sinus rhythm, rate 76.  Normal axis, normal intervals.  Minimal voltage criteria for LVH, isolated Q-wave in 3.  No ST-T wave changes compared to EKG from yesterday.  Pt Was asymptomatic while EKG was obtained.  The differential for shortness of breath and chest pain in this patient includes unstable angina, COPD paroxysmal atrial fibrillation, PE, anxiety, symptomatic pulmonary hypertension.  CT yesterday was negative for any pulmonary process such as pneumonia, CHF, pericardial effusion.  Primary concern is unstable angina.  Transferring to  the [facility] ED for further work-up and possible cardiology consult.  Feel that patient is stable go by private vehicle.  She is asymptomatic right now, her vitals and EKG are normal.  Discussed medical decision-making, rationale for transfer to the ER with daughter and patient.  They agree to go. Notified charge nurse.  No orders of the  defined types were placed in this encounter.   *This clinic note was created using Dragon dictation software. Therefore, there may be occasional mistakes despite careful proofreading.   ?    Melynda Ripple, MD 10/17/19 1719

## 2019-10-17 NOTE — ED Triage Notes (Signed)
Pt here for [facility] and dizziness for a "while".  Pt unable to give time how long present.  VSS at this time. Seen here recently for same.  Chest tightness at times but none now.

## 2019-10-17 NOTE — ED Notes (Signed)
Difficult stick  

## 2019-10-18 ENCOUNTER — Inpatient Hospital Stay
Admission: EM | Admit: 2019-10-18 | Discharge: 2019-10-23 | DRG: 811 | Disposition: A | Discharge status: Home / Self Care | Payer: Medicare HMO | Attending: Hospitalist | Admitting: Hospitalist

## 2019-10-18 ENCOUNTER — Encounter: Payer: Self-pay | Admitting: Internal Medicine

## 2019-10-18 DIAGNOSIS — E871 Hypo-osmolality and hyponatremia: Secondary | ICD-10-CM

## 2019-10-18 DIAGNOSIS — J449 Chronic obstructive pulmonary disease, unspecified: Secondary | ICD-10-CM | POA: Diagnosis present

## 2019-10-18 DIAGNOSIS — I480 Paroxysmal atrial fibrillation: Secondary | ICD-10-CM | POA: Diagnosis present

## 2019-10-18 DIAGNOSIS — D649 Anemia, unspecified: Secondary | ICD-10-CM | POA: Diagnosis not present

## 2019-10-18 DIAGNOSIS — I160 Hypertensive urgency: Secondary | ICD-10-CM

## 2019-10-18 DIAGNOSIS — I2510 Atherosclerotic heart disease of native coronary artery without angina pectoris: Secondary | ICD-10-CM | POA: Diagnosis present

## 2019-10-18 DIAGNOSIS — I10 Essential (primary) hypertension: Secondary | ICD-10-CM | POA: Diagnosis present

## 2019-10-18 DIAGNOSIS — Z7901 Long term (current) use of anticoagulants: Secondary | ICD-10-CM

## 2019-10-18 DIAGNOSIS — K922 Gastrointestinal hemorrhage, unspecified: Secondary | ICD-10-CM | POA: Diagnosis not present

## 2019-10-18 DIAGNOSIS — R911 Solitary pulmonary nodule: Secondary | ICD-10-CM

## 2019-10-18 DIAGNOSIS — R079 Chest pain, unspecified: Secondary | ICD-10-CM

## 2019-10-18 DIAGNOSIS — E041 Nontoxic single thyroid nodule: Secondary | ICD-10-CM

## 2019-10-18 DIAGNOSIS — N183 Chronic kidney disease, stage 3 unspecified: Secondary | ICD-10-CM

## 2019-10-18 DIAGNOSIS — I5032 Chronic diastolic (congestive) heart failure: Secondary | ICD-10-CM

## 2019-10-18 DIAGNOSIS — R1319 Other dysphagia: Secondary | ICD-10-CM

## 2019-10-18 HISTORY — DX: Hypertensive urgency: I16.0

## 2019-10-18 HISTORY — DX: Long term (current) use of anticoagulants: Z79.01

## 2019-10-18 LAB — CBC
HCT: 20 % — ABNORMAL LOW (ref 36.0–46.0)
Hemoglobin: 6 g/dL — ABNORMAL LOW (ref 12.0–15.0)
MCH: 21.1 pg — ABNORMAL LOW (ref 26.0–34.0)
MCHC: 30 g/dL (ref 30.0–36.0)
MCV: 70.4 fL — ABNORMAL LOW (ref 80.0–100.0)
Platelets: 183 10*3/uL (ref 150–400)
RBC: 2.84 MIL/uL — ABNORMAL LOW (ref 3.87–5.11)
RDW: 16 % — ABNORMAL HIGH (ref 11.5–15.5)
WBC: 11.3 10*3/uL — ABNORMAL HIGH (ref 4.0–10.5)
nRBC: 0 % (ref 0.0–0.2)

## 2019-10-18 LAB — TROPONIN I (HIGH SENSITIVITY)
Troponin I (High Sensitivity): 60 ng/L — ABNORMAL HIGH (ref <18)
Troponin I (High Sensitivity): 48 ng/L — ABNORMAL HIGH (ref <18)

## 2019-10-18 LAB — BRAIN NATRIURETIC PEPTIDE: B Natriuretic Peptide: 868.8 pg/mL — ABNORMAL HIGH (ref 0.0–100.0)

## 2019-10-18 LAB — HEMOGLOBIN AND HEMATOCRIT, BLOOD
HCT: 24 % — ABNORMAL LOW (ref 36.0–46.0)
Hemoglobin: 7.4 g/dL — ABNORMAL LOW (ref 12.0–15.0)

## 2019-10-18 LAB — SARS CORONAVIRUS 2 BY RT PCR (HOSPITAL ORDER, PERFORMED IN ~~LOC~~ HOSPITAL LAB): SARS Coronavirus 2: NEGATIVE

## 2019-10-18 MED ORDER — FUROSEMIDE 10 MG/ML IJ SOLN
20.0000 mg | Freq: Once | INTRAMUSCULAR | Status: DC
  Filled 2019-10-18: qty 4

## 2019-10-18 MED ORDER — ONDANSETRON HCL 4 MG PO TABS: 4.0000 mg | ORAL_TABLET | Freq: Four times a day (QID) | ORAL | Status: DC | PRN

## 2019-10-18 MED ORDER — DIPHENHYDRAMINE HCL 50 MG/ML IJ SOLN
25.0000 mg | Freq: Every day | INTRAMUSCULAR | Status: DC | PRN
  Filled 2019-10-18: qty 1

## 2019-10-18 MED ORDER — ACETAMINOPHEN 650 MG RE SUPP: 650.0000 mg | Freq: Four times a day (QID) | RECTAL | Status: DC | PRN

## 2019-10-18 MED ORDER — FLUTICASONE FUROATE-VILANTEROL 200-25 MCG/INH IN AEPB
1.0000 | INHALATION_SPRAY | Freq: Every day | RESPIRATORY_TRACT | Status: DC
  Administered 2019-10-19 – 2019-10-23 (×3): 1 via RESPIRATORY_TRACT
  Filled 2019-10-18: qty 28

## 2019-10-18 MED ORDER — TEMAZEPAM 7.5 MG PO CAPS
30.0000 mg | ORAL_CAPSULE | Freq: Every evening | ORAL | Status: DC | PRN
  Administered 2019-10-18 – 2019-10-22 (×4): 30 mg via ORAL
  Filled 2019-10-18 (×2): qty 4
  Filled 2019-10-18 (×2): qty 2

## 2019-10-18 MED ORDER — MORPHINE SULFATE (PF) 2 MG/ML IV SOLN
2.0000 mg | Freq: Two times a day (BID) | INTRAVENOUS | Status: DC | PRN
  Administered 2019-10-18: 2 mg via INTRAVENOUS
  Filled 2019-10-18: qty 1

## 2019-10-18 MED ORDER — MELATONIN 5 MG PO TABS
10.0000 mg | ORAL_TABLET | Freq: Every day | ORAL | Status: DC | PRN
  Filled 2019-10-18 (×2): qty 2

## 2019-10-18 MED ORDER — FUROSEMIDE 10 MG/ML IJ SOLN
40.0000 mg | Freq: Once | INTRAMUSCULAR | Status: AC
  Administered 2019-10-18: 40 mg via INTRAVENOUS
  Filled 2019-10-18: qty 4

## 2019-10-18 MED ORDER — ACETAMINOPHEN 325 MG PO TABS
650.0000 mg | ORAL_TABLET | Freq: Four times a day (QID) | ORAL | Status: DC | PRN
  Administered 2019-10-18 – 2019-10-22 (×3): 650 mg via ORAL
  Filled 2019-10-18 (×4): qty 2

## 2019-10-18 MED ORDER — GABAPENTIN 300 MG PO CAPS
300.0000 mg | ORAL_CAPSULE | Freq: Two times a day (BID) | ORAL | Status: DC
  Administered 2019-10-18 – 2019-10-23 (×9): 300 mg via ORAL
  Filled 2019-10-18 (×9): qty 1

## 2019-10-18 MED ORDER — ONDANSETRON HCL 4 MG/2ML IJ SOLN: 4.0000 mg | Freq: Four times a day (QID) | INTRAMUSCULAR | Status: DC | PRN

## 2019-10-18 MED ORDER — CYCLOBENZAPRINE HCL 10 MG PO TABS
10.0000 mg | ORAL_TABLET | Freq: Three times a day (TID) | ORAL | Status: DC | PRN
  Administered 2019-10-18: 10 mg via ORAL
  Filled 2019-10-18: qty 1

## 2019-10-18 MED ORDER — ALPRAZOLAM 0.5 MG PO TABS
0.5000 mg | ORAL_TABLET | Freq: Three times a day (TID) | ORAL | Status: DC | PRN
  Administered 2019-10-18 – 2019-10-20 (×4): 0.5 mg via ORAL
  Filled 2019-10-18 (×4): qty 1

## 2019-10-18 MED ORDER — FLUTICASONE PROPIONATE 50 MCG/ACT NA SUSP
1.0000 | Freq: Two times a day (BID) | NASAL | Status: DC | PRN
  Administered 2019-10-18 – 2019-10-23 (×4): 1 via NASAL
  Filled 2019-10-18 (×2): qty 16

## 2019-10-18 MED ORDER — HYDRALAZINE HCL 20 MG/ML IJ SOLN
10.0000 mg | INTRAMUSCULAR | Status: AC | PRN
  Administered 2019-10-18 (×2): 10 mg via INTRAVENOUS
  Filled 2019-10-18 (×2): qty 1

## 2019-10-18 MED ORDER — NITROGLYCERIN 0.4 MG SL SUBL
0.4000 mg | SUBLINGUAL_TABLET | SUBLINGUAL | Status: AC | PRN
  Administered 2019-10-18 (×3): 0.4 mg via SUBLINGUAL
  Filled 2019-10-18: qty 1

## 2019-10-18 MED ORDER — NITROGLYCERIN 2 % TD OINT
TOPICAL_OINTMENT | TRANSDERMAL | Status: AC
  Administered 2019-10-18: 0.5 [in_us] via TOPICAL
  Filled 2019-10-18: qty 1

## 2019-10-18 MED ORDER — PANTOPRAZOLE SODIUM 40 MG PO TBEC
40.0000 mg | DELAYED_RELEASE_TABLET | Freq: Every day | ORAL | Status: DC
  Administered 2019-10-18: 40 mg via ORAL
  Filled 2019-10-18: qty 1

## 2019-10-18 MED ORDER — SUCRALFATE 1 GM/10ML PO SUSP
1.0000 g | Freq: Three times a day (TID) | ORAL | Status: DC
  Administered 2019-10-18 – 2019-10-20 (×8): 1 g via ORAL
  Filled 2019-10-18 (×10): qty 10

## 2019-10-18 MED ORDER — METOPROLOL SUCCINATE ER 100 MG PO TB24
200.0000 mg | ORAL_TABLET | Freq: Every day | ORAL | Status: DC
  Administered 2019-10-18 – 2019-10-23 (×6): 200 mg via ORAL
  Filled 2019-10-18 (×7): qty 2

## 2019-10-18 MED ORDER — NITROGLYCERIN 2 % TD OINT: 0.5000 [in_us] | TOPICAL_OINTMENT | Freq: Once | TRANSDERMAL | Status: AC

## 2019-10-18 MED ORDER — SODIUM CHLORIDE 0.9% IV SOLUTION
Freq: Once | INTRAVENOUS | Status: AC
  Administered 2019-10-20: 200 mL/h via INTRAVENOUS
  Filled 2019-10-18: qty 250

## 2019-10-18 MED ORDER — TRAMADOL HCL 50 MG PO TABS
50.0000 mg | ORAL_TABLET | Freq: Four times a day (QID) | ORAL | Status: DC | PRN
  Administered 2019-10-20: 13:00:00 50 mg via ORAL
  Filled 2019-10-18: qty 1

## 2019-10-18 MED ORDER — PANTOPRAZOLE SODIUM 40 MG IV SOLR
40.0000 mg | Freq: Two times a day (BID) | INTRAVENOUS | Status: DC
  Administered 2019-10-18 – 2019-10-19 (×3): 40 mg via INTRAVENOUS
  Filled 2019-10-18 (×3): qty 40

## 2019-10-18 NOTE — Progress Notes (Signed)
Responded to consult for PIV. Noted PIV obtained by RN. Consult cleared. 

## 2019-10-18 NOTE — Progress Notes (Signed)
1300: Patient complained of 10/10 chest pain. Patient stated "I feel tons of pressure sitting on my chest". This RN notified MD, obtained STAT ekg, gave 3 sublingual nitro with little relief. Troponin level drawn. MD ordered PRN morphine and xanax. Both meds have been administered. Pt appeared to be SOB, with an increased worked of breathing, VSS.   1520: Patient states she is now feeling better. No complaints of chest pain.

## 2019-10-18 NOTE — Progress Notes (Addendum)
PROGRESS NOTE    Jessica Howell  UXN:[id number] DOB: 1935-02-14 DOA: 10/18/2019 PCP: Glean Hess, MD    Assessment & Plan:   Principal Problem:   Symptomatic anemia Active Problems:   Paroxysmal atrial fibrillation (HCC)   Essential (primary) hypertension   Esophageal dysphagia   Coronary atherosclerosis of native coronary artery   Chronic obstructive pulmonary disease, unspecified (HCC)   Elevated troponin   Chronic anticoagulation, recent, Xarelto    Chronic diastolic CHF (congestive heart failure) (HCC)   Hypertensive urgency   Hyponatremia   Pulmonary nodule 1 cm or greater in diameter   Thyroid nodule greater than or equal to 1.5 cm in diameter incidentally noted on imaging study   CKD (chronic kidney disease) stage 3, GFR 30-59 ml/min   History of GI bleed    Jessica Howell is a 85 y.o. female with medical history significant for CAD, COPD, diastolic CHF, HTN, depression, paroxysmal atrial fibrillation, recently on Xarelto, discontinued on 10/13/2019 by Dr. Nehemiah Massed secondary to anemia, and with history of need for IV iron in the past for anemia from suspected chronic GI losses, who presents to the emergency room with a several month history of episodic retrosternal chest pain, dyspnea on exertion for which she was recently undergoing extensive evaluation.   Symptomatic anemia   Chronic anticoagulation, recent, Xarelto, discontinued on 10/13/19   History of chronic GI blood loss -Patient presents with chest pain and shortness of breath with finding of hemoglobin of 6.1, down from 14.6 about 4 months prior -Intolerant of IV iron on 10/16/2019, though has received IV iron in the past for suspected chronic GI blood loss, per review of records from [facility] -Anemia work-up revealing iron of 11, ferritin 10 and increased TIBC PLAN: --1u pRBC today -Clear liquid diet.   --IV Protonix BID -GI consult  Chest pain, ACS ruled out Elevated troponin 2/2 demand ischemia   Coronary  atherosclerosis of native coronary artery -Patient has troponin elevation 38>>47>>63>>67>>60>>48, in association with reports of chest pain -Chest pain suspect epigastric pain from probable gastritis PLAN: -Continue beta-blockers, statins  AKI --BP elevated, labs did not suggest dehydration.  BNP elevated, so maybe due to congestion. --IV lasix 40 x1    Hypertensive urgency with mild CHF -BP 208/133.  BNP elevated at 868 but without pulmonary edema on chest x-ray, however patient is having conversational dyspnea --continue home metop --Hold home ACE/ARB 2/2 AKI -IV lasix 40 x1  -Daily weights, intake and output monitoring -Hydralazine as needed systolic blood pressure over 180    Hyponatremia -Sodium 127, suspect hypervolemic from mild heart failure exacerbation -IV lasix 40 x1     Paroxysmal atrial fibrillation (HCC) -Currently in sinus rhythm -Xarelto was discontinued on 10/13/2019 due to symptomatic anemia -Continue home metop    Esophageal dysphagia -No acute concerns.     Chronic obstructive pulmonary disease, unspecified (HCC) -Not acutely exacerbated -continue home daily bronchodilators    Pulmonary nodule 1 cm or greater in diameter -Incidental finding on CT from 10/16/2019 -Recommendations per radiology for outpatient follow-up    Thyroid nodule greater than or equal to 1.5 cm in diameter incidentally noted on imaging study -Thyroid ultrasound can be arranged as outpatient    DVT prophylaxis: SCD/Compression stockings Code Status: DNR  Family Communication:  Status is: observation Dispo:   The patient is from: home Anticipated d/c is to: home Anticipated d/c date is: in 1-2 days Patient currently is not medically stable to d/c due to: need work up for likely GI  bleed.   Subjective and Interval History:  Pt received 1u pRBC today, afterwards, started having 1 of her "chest pain" episodes.  ACS workup again neg.  SL nitro didn't help but chest pain  resolved later.  Pt admitted having anxiety issues which improved after home Xanax given.  Denied dyspnea but was noted to have high RR.  No fever, N/V/D.  Complained of pain in her feet (chronic) and whole-body aches.      Objective: Vitals:   10/18/19 1305 10/18/19 1308 10/18/19 1314 10/18/19 1656  BP: (!) 112/56 (!) 121/59 106/70 (!) 183/86  Pulse: 86 86 87 78  Resp: (!) 21   18  Temp:    98.7 F (37.1 C)  TempSrc:    Oral  SpO2: 100% 100% 100% 100%  Weight:      Height:        Intake/Output Summary (Last 24 hours) at 10/18/2019 1800 Last data filed at 10/18/2019 1700 Gross per 24 hour  Intake 1620 ml  Output 401 ml  Net 1219 ml   Filed Weights   10/17/19 1825 10/17/19 2029 10/18/19 0619  Weight: 65 kg 64.9 kg 65.1 kg    Examination:   Constitutional: NAD, AAOx3 HEENT: conjunctivae and lids normal, EOMI CV: RRR no M,R,G. Distal pulses +2.  No cyanosis.   RESP: CTA B/L, short shallow breaths, on 2L sating 98% GI: +BS, NTND Extremities: No effusions, edema in BLE SKIN: warm, dry and intact Neuro: II - XII grossly intact.  Sensation intact Psych: anxious mood and affect.     Data Reviewed: I have personally reviewed following labs and imaging studies  CBC: Recent Labs  Lab 10/16/19 1437 10/17/19 1830 10/18/19 0419 10/18/19 1305  WBC 8.3 13.9* 11.3*  --   NEUTROABS 5.2  --   --   --   HGB 6.7* 6.1* 6.0* 7.4*  HCT 22.6* 21.3* 20.0* 24.0*  MCV 71.3* 72.0* 70.4*  --   PLT Not Measured PLATELET CLUMPS NOTED ON SMEAR, UNABLE TO ESTIMATE 183  --    Basic Metabolic Panel: Recent Labs  Lab 10/16/19 1437 10/17/19 1830  NA 137 127*  K 3.7 4.4  CL 100 95*  CO2 26 23  GLUCOSE 118* 111*  BUN 13 22  CREATININE 1.10* 1.51*  CALCIUM 9.3 8.7*   GFR: Estimated Creatinine Clearance: 24.2 mL/min (A) (by C-G formula based on SCr of 1.51 mg/dL (H)). Liver Function Tests: Recent Labs  Lab 10/16/19 1437 10/17/19 1830  AST 20 27  ALT 18 20  ALKPHOS 66 53    BILITOT 0.5 0.6  PROT 7.7 6.8  ALBUMIN 4.0 3.7   No results for input(s): LIPASE, AMYLASE in the last 168 hours. No results for input(s): AMMONIA in the last 168 hours. Coagulation Profile: No results for input(s): INR, PROTIME in the last 168 hours. Cardiac Enzymes: No results for input(s): CKTOTAL, CKMB, CKMBINDEX, TROPONINI in the last 168 hours. BNP (last 3 results) No results for input(s): PROBNP in the last 8760 hours. HbA1C: No results for input(s): HGBA1C in the last 72 hours. CBG: No results for input(s): GLUCAP in the last 168 hours. Lipid Profile: No results for input(s): CHOL, HDL, LDLCALC, TRIG, CHOLHDL, LDLDIRECT in the last 72 hours. Thyroid Function Tests: Recent Labs    10/16/19 1437  TSH 2.840   Anemia Panel: Recent Labs    10/16/19 1437 10/16/19 1438  VITAMINB12  --  281  FOLATE 21.4  --   FERRITIN 10*  --  TIBC 532*  --   IRON 11*  --   RETICCTPCT 2.1  --    Sepsis Labs: No results for input(s): PROCALCITON, LATICACIDVEN in the last 168 hours.  Recent Results (from the past 240 hour(s))  SARS Coronavirus 2 by RT PCR (hospital order, performed in [facility] hospital lab) Nasopharyngeal Nasopharyngeal Swab     Status: None   Collection Time: 10/18/19  4:00 AM   Specimen: Nasopharyngeal Swab  Result Value Ref Range Status   SARS Coronavirus 2 NEGATIVE NEGATIVE Final    Comment: (NOTE) SARS-CoV-2 target nucleic acids are NOT DETECTED.  The SARS-CoV-2 RNA is generally detectable in upper and lower respiratory specimens during the acute phase of infection. The lowest concentration of SARS-CoV-2 viral copies this assay can detect is 250 copies / mL. A negative result does not preclude SARS-CoV-2 infection and should not be used as the sole basis for treatment or other patient management decisions.  A negative result may occur with improper specimen collection / handling, submission of specimen other than nasopharyngeal swab, presence of viral  mutation(s) within the areas targeted by this assay, and inadequate number of viral copies (<250 copies / mL). A negative result must be combined with clinical observations, patient history, and epidemiological information.  Fact Sheet for Patients:   StrictlyIdeas.no  Fact Sheet for Healthcare Providers: BankingDealers.co.za  This test is not yet approved or  cleared by the [place] FDA and has been authorized for detection and/or diagnosis of SARS-CoV-2 by FDA under an Emergency Use Authorization (EUA).  This EUA will remain in effect (meaning this test can be used) for the duration of the COVID-19 declaration under Section 564(b)(1) of the Act, 21 U.S.C. section 360bbb-3(b)(1), unless the authorization is terminated or revoked sooner.  Performed at [facility], [address], [place], [place] [address]       Radiology Studies: DG Chest 2 View  Result Date: 10/17/2019 CLINICAL DATA:  Shortness of breath EXAM: CHEST - 2 VIEW COMPARISON:  CT chest 10/16/2019 FINDINGS: There is mild bilateral interstitial thickening. There is no focal consolidation. There is no pleural effusion or pneumothorax. There is cardiomegaly. There is thoracic aortic atherosclerosis. There is a healed right humeral neck fracture. There is a healed left humeral neck fracture transfixed with a metallic sideplate and interlocking screws. IMPRESSION: No active cardiopulmonary disease. Electronically Signed   By: Kathreen Devoid   On: 10/17/2019 19:09     Scheduled Meds: . sodium chloride   Intravenous Once  . [START ON 10/19/2019] fluticasone furoate-vilanterol  1 puff Inhalation Daily  . furosemide  20 mg Intravenous Once  . pantoprazole (PROTONIX) IV  40 mg Intravenous Q12H  . sucralfate  1 g Oral TID WC & HS   Continuous Infusions:   LOS: 0 days   No charge note.   Enzo Bi, MD [facility] If 7PM-7AM, please contact  night-coverage 10/18/2019, 6:00 PM

## 2019-10-18 NOTE — Progress Notes (Signed)
Patient received 1 unit of RBC's. At the end of the transfusion patient complained of back pain. Patient appeared to be SOB, not in distress, VSS. This RN placed patient on 2L of 02 via [place]. MD notified about back pain and stated to hold off on giving 2nd unit of RBC's due to pt having a symptom of possible reaction to blood.

## 2019-10-18 NOTE — ED Provider Notes (Signed)
[facility] Emergency Department Provider Note  ____________________________________________   First MD Initiated Contact with Patient 10/18/19 0157     (approximate)  I have reviewed the triage vital signs and the nursing notes.   HISTORY  Chief Complaint Shortness of Breath    HPI Jessica Howell is a 85 y.o. female with below list of previous medical conditions including A. fib CHF COPD hypertension and previous MI presents to the emergency department secondary to episodes of chest pressure, dyspnea with accompanying dizziness which patient states has been occurring for a while but has worsened over the past week occurring daily.  Patient denies any headache no nausea or vomiting.  Patient was recently seen in the emergency department following similar episode on 10/16/2019 where she started to have acute onset of the symptoms that were beforementioned after receiving iron infusion.  Patient had an extensive work-up performed at that time including CT angiogram which revealed no acute pathology.       Past Medical History:  Diagnosis Date  . A-fib ([place])   . Allergy   . Anemia   . CHF (congestive heart failure) ([place])   . COPD (chronic obstructive pulmonary disease) ([place])   . Depression   . Feeling grief   . GERD (gastroesophageal reflux disease)   . Hyperlipidemia   . Hypertension   . Hypokalemia 09/06/2016   Last Assessment & Plan:  Formatting of this note is different from the original. Will continue to monitor; ordered labs as below. Lab Results  Component Value Date   K 3.2 (L) 04/12/2016   K 3.5 04/10/2016   K 4.2 04/09/2016   K 4.4 11/21/2013   K 4.4 01/03/2013  . Myocardial infarction ([place])   . Osteoarthritis   . Post-nasal drip 11/10/2015   Formatting of this note might be different from the original. Overview:   (Working Diagnosis)  Last Assessment & Plan:  Formatting of this note might be different from the original. Continue OTC flonase 2  sprays daily Likely contributing to cough.  Will see if flonase helps with cough and PND  . Right wrist pain 06/20/2015   Last Assessment & Plan:  Formatting of this note might be different from the original. Significant edema, and some grip and extension weakness, and persistent severe pain. She was not able to tolerate splint Reviewed xr of her recent ED visit No fractures identified (except old fracture on radial head)  +++ snuff box tenderness persists [facility]erred to [facility] Information provide  . Vitamin D deficiency     Patient Active Problem List   Diagnosis Date Noted  . Microcytic anemia 10/16/2019  . Acquired thrombophilia ([place]) 10/09/2019  . Senile osteoporosis 06/11/2019  . Current severe episode of major depressive disorder without psychotic features ([place]) 06/11/2019  . Myalgia due to statin 06/11/2019  . Esophageal dysphagia 05/03/2018  . Risk for falls 03/21/2018  . Iron deficiency anemia 03/18/2018  . Weakness of both legs 10/28/2017  . Other age-related cataract 05/31/2016  . Paroxysmal atrial fibrillation ([place]) 04/15/2016  . Skin lesion of breast 11/21/2013  . CHF (congestive heart failure) ([place]) 10/16/2013  . Other seborrheic keratosis 01/03/2013  . Coronary atherosclerosis of native coronary artery 01/03/2013  . Vitamin D deficiency 12/19/2012  . Mixed hyperlipidemia 12/19/2012  . Essential (primary) hypertension 12/19/2012  . Chronic obstructive pulmonary disease, unspecified ([place]) 12/19/2012  . Generalized osteoarthrosis, involving multiple sites 12/19/2012  . Urinary incontinence 12/19/2012    Past Surgical History:  Procedure Laterality Date  . ABDOMINAL HYSTERECTOMY    . BREAST SURGERY    . FOREARM SURGERY      Prior to Admission medications   Medication Sig Start Date End Date Taking? Authorizing Provider  albuterol (VENTOLIN HFA) 108 (90 Base) MCG/ACT inhaler every 6 (six) hours as needed.     [provider]  ALPRAZolam  Duanne Moron) 0.5 MG tablet Take 0.5 mg by mouth 3 (three) times daily as needed.     [provider]  Cholecalciferol (VITAMIN D) 125 MCG (5000 UT) CAPS Take 1 capsule by mouth daily.     [provider]  cloNIDine (CATAPRES) 0.2 MG tablet  09/27/19   [provider]  Fluticasone-Salmeterol (ADVAIR DISKUS) 250-50 MCG/DOSE AEPB Inhale into the lungs. 04/19/19 10/11/19  [provider]  furosemide (LASIX) 40 MG tablet Take 1 tablet by mouth in the morning and at bedtime. 04/19/19   [provider]  Melatonin 5 MG CHEW Chew 10 mg by mouth.     [provider]  Metoprolol Succinate 200 MG CS24 Take 200 mg by mouth daily.    [provider]  montelukast (SINGULAIR) 10 MG tablet Take by mouth at bedtime.  08/07/15   [provider]  nitroGLYCERIN (NITROSTAT) 0.4 MG SL tablet Place 1 tablet (0.4 mg total) under the tongue every 5 (five) minutes as needed for chest pain. 10/09/19   Glean Hess, MD  omeprazole (PRILOSEC) 20 MG capsule TAKE 1 CAPSULE (20 MG TOTAL) BY MOUTH 2 (TWO) TIMES DAILY BEFORE A MEAL. 08/30/19   Glean Hess, MD  potassium chloride (KLOR-CON) 10 MEQ tablet Take 10 mEq by mouth 2 (two) times daily.  04/27/19   [provider]  ramipril (ALTACE) 10 MG capsule Take 10 mg by mouth 2 (two) times daily.  09/21/13   [provider]  temazepam (RESTORIL) 30 MG capsule temazepam 30 mg caps    [provider]  traMADol (ULTRAM) 50 MG tablet Take by mouth every 6 (six) hours as needed.    [provider]  vitamin E 180 MG (400 UNITS) capsule Take 400 Units by mouth daily.     [provider]    Allergies Penicillins, Iron, Azithromycin, Gemfibrozil, Solifenacin, and Statins  Family History  Problem Relation Age of Onset  . Diabetes Mother   . Hypertension Mother   . Cancer Father        esoph.   Marland Kitchen COPD Sister   . Heart disease Brother   . Early death Son     Social  History Social History   Tobacco Use  . Smoking status: Former Smoker    Packs/day: 0.50    Years: 45.00    Pack years: 22.50    Types: Cigarettes    Quit date: 06/10/2004    Years since quitting: 15.3  . Smokeless tobacco: Never Used  Vaping Use  . Vaping Use: Never used  Substance Use Topics  . Alcohol use: Never  . Drug use: Not Currently    Review of Systems Constitutional: No fever/chills Eyes: No visual changes. ENT: No sore throat. Cardiovascular: Positive for chest pain. Respiratory: Positive for shortness of breath. Gastrointestinal: No abdominal pain.  No nausea, no vomiting.  No diarrhea.  No constipation. Genitourinary: Negative for dysuria. Musculoskeletal: Negative for neck pain.  Negative for back pain. Integumentary: Negative for rash. Neurological: Negative for headaches, focal weakness or numbness.  Positive for dizziness   ____________________________________________   PHYSICAL EXAM:  VITAL SIGNS: ED Triage Vitals  Enc Vitals Group     BP 10/17/19 1825 (!) 182/73     Pulse Rate 10/17/19 1825 72     Resp 10/17/19 1825 18     Temp 10/17/19 1825 97.9 F (36.6 C)     Temp Source 10/17/19 1825 Oral     SpO2 10/17/19 1825 100 %     Weight 10/17/19 1825 65 kg (143 lb 4.8 oz)     Height 10/17/19 2029 1.524 m (5')     Head Circumference --      Peak Flow --      Pain Score 10/17/19 1825 0     Pain Loc --      Pain Edu? --      Excl. in [place]? --     Constitutional: Alert and oriented.  Eyes: Conjunctivae are pale Head: Atraumatic. Mouth/Throat: Patient is wearing a mask. Neck: No stridor.  No meningeal signs.   Cardiovascular: Normal rate, regular rhythm. Good peripheral circulation. Grossly normal heart sounds. Respiratory: Normal respiratory effort.  No retractions. Gastrointestinal: Soft and nontender. No distention.  Musculoskeletal: No lower extremity tenderness nor edema. No gross deformities of extremities. Neurologic:  Normal speech and  language. No gross focal neurologic deficits are appreciated.  Skin:  Skin is warm, dry and intact. Psychiatric: Mood and affect are normal. Speech and behavior are normal.  ____________________________________________   LABS (all labs ordered are listed, but only abnormal results are displayed)  Labs Reviewed  CBC - Abnormal; Notable for the following components:      Result Value   WBC 13.9 (*)    RBC 2.96 (*)    Hemoglobin 6.1 (*)    HCT 21.3 (*)    MCV 72.0 (*)    MCH 20.6 (*)    MCHC 28.6 (*)    RDW 15.9 (*)    All other components within normal limits  COMPREHENSIVE METABOLIC PANEL - Abnormal; Notable for the following components:   Sodium 127 (*)    Chloride 95 (*)    Glucose, Bld 111 (*)    Creatinine, Ser 1.51 (*)    Calcium 8.7 (*)    GFR calc non Af Amer 32 (*)    GFR calc Af Amer 37 (*)    All other components within normal limits  TROPONIN I (HIGH SENSITIVITY) - Abnormal; Notable for the following components:   Troponin I (High Sensitivity) 63 (*)    All other components within normal limits  TROPONIN I (HIGH SENSITIVITY) - Abnormal; Notable for the following components:   Troponin I (High Sensitivity) 67 (*)    All other components within normal limits  BRAIN NATRIURETIC PEPTIDE  TROPONIN I (HIGH SENSITIVITY)   ____________________________________________  EKG  ED ECG REPORT I, Cornell N Judea Fennimore, the attending physician, personally viewed and interpreted this ECG.   Date: 10/17/2019  EKG Time: 6:19 PM  Rate: 75  Rhythm: Normal sinus rhythm  Axis: Normal  Intervals: Normal  ST&T Change: None  ____________________________________________  RADIOLOGY I, Victoria N Jaxx Huish, personally viewed and evaluated these images (plain radiographs) as part of my medical decision making, as well as reviewing the written report by the radiologist.  ED MD interpretation: No active cardiopulmonary disease noted on chest x-ray per radiologist  Official radiology  report(s): DG Chest 2 View  Result Date: 10/17/2019 CLINICAL DATA:  Shortness of breath EXAM: CHEST - 2 VIEW COMPARISON:  CT chest 10/16/2019 FINDINGS: There is mild bilateral interstitial thickening. There is  no focal consolidation. There is no pleural effusion or pneumothorax. There is cardiomegaly. There is thoracic aortic atherosclerosis. There is a healed right humeral neck fracture. There is a healed left humeral neck fracture transfixed with a metallic sideplate and interlocking screws. IMPRESSION: No active cardiopulmonary disease. Electronically Signed   By: Kathreen Devoid   On: 10/17/2019 19:09    ____________________________________________   PROCEDURES    Procedures   ____________________________________________   INITIAL IMPRESSION / MDM / [place] / ED COURSE  As part of my medical decision making, I reviewed the following data within the electronic MEDICAL RECORD NUMBER  85 year old female presented with above-stated history and physical exam differential diagnosis including but not limited to ACS, CHF exacerbation, symptomatic anemia.  Patient's laboratory data notable for an elevated troponin of 63 and subsequently 67.  BNP 868.  Hemoglobin noted to be 6.1 which is down from 2.7 on 10/16/2019.  EKG revealed no evidence of ischemia or infarction.  Patient with ongoing symptoms and as such will admit the patient for further evaluation and management.  Patient discussed with Dr. Damita Dunnings  ____________________________________________  FINAL CLINICAL IMPRESSION(S) / ED DIAGNOSES  Final diagnoses:  Symptomatic anemia  Chest pain, unspecified type     MEDICATIONS GIVEN DURING THIS VISIT:  Medications  nitroGLYCERIN (NITROGLYN) 2 % ointment 0.5 inch (0.5 inches Topical Given 10/18/19 0215)     ED Discharge Orders    None      *Please note:  Jessica Howell was evaluated in Emergency Department on 10/18/2019 for the symptoms described in the history of present  illness. She was evaluated in the context of the global COVID-19 pandemic, which necessitated consideration that the patient might be at risk for infection with the SARS-CoV-2 virus that causes COVID-19. Institutional protocols and algorithms that pertain to the evaluation of patients at risk for COVID-19 are in a state of rapid change based on information released by regulatory bodies including the CDC and federal and state organizations. These policies and algorithms were followed during the patient's care in the ED.  Some ED evaluations and interventions may be delayed as a result of limited staffing during and after the pandemic.*  Note:  This document was prepared using Dragon voice recognition software and may include unintentional dictation errors.   Gregor Hams, MD 10/19/19 [phone]

## 2019-10-18 NOTE — H&P (Signed)
History and Physical    Jessica Howell UVO:[id number] DOB: 27-Apr-1934 DOA: 10/18/2019  PCP: Glean Hess, MD   Patient coming from: Home  I have personally  reviewed patient's old medical records in [place]  Chief Complaint: Chest pain and shortness of breath  HPI: Jessica Howell is a 85 y.o. female with medical history significant for CAD, COPD, diastolic CHF, HTN, depression, paroxysmal atrial fibrillation, recently on Xarelto, discontinued on 10/13/2019 by Dr. Nehemiah Massed secondary to anemia, and with history of need for IV iron in the past for anemia from suspected chronic GI losses, who presents to the emergency room with a several month history of episodic retrosternal chest pain, dyspnea on exertion for which she was recently undergoing extensive evaluation.  Patient recently establish care with cardiologist, Dr. Nehemiah Massed on 10/12/2019 with a complaint of chest pain and shortness of breath.  She was previously followed by [facility] cardiology who had her on Xarelto for her A. fib. Labs were done revealing a hemoglobin of 7.2, down from 14.6, 4 months prior.  Patient was referred to the cancer center for outpatient iron transfusion on 6/22.  Immediately after the iron transfusion was started, patient had sudden onset severe chest pain followed by a brief syncopal episode with suspicion for acute allergic reaction and possible cardiac arrest per documentation.  She was taken to the emergency room by EMS and underwent an exhaustive work-up that included CTA aorta.  Except for hemoglobin of 6.9, her work-up was unremarkable and she was discharged.  She returned to the emergency room on 10/17/2019 with continued complaints of chest pain and shortness of breath with exertion.  Describes chest pain as tightness retrosternal area radiating to the upper abdomen of mild to moderate intensity.  No aggravating or alleviating factors.  No associated nausea vomiting or diaphoresis.  She denied cough fever or  chills.  Endorsed that her symptoms have been intermittent for months but have been increasing in frequency of late.  Also feels weak.  Patient denies abdominal pain beyond her usual for GERD, denies vomiting, black or bloody stool. Does have bruising on skin. ED Course: On arrival in the emergency room she was dyspneic and with mild chest discomfort.  Blood pressure was markedly elevated at 208/133.  EKG showed normal sinus rhythm with no acute ST-T wave changes and chest x-ray showed no acute disease.  Patient was in the ER for several hours with pertinent lab findings as follows: Hemoglobin 6.1, down from 7.2 nine days prior, down from 14.6 four months ago Troponin 38 38>>47>>63>>67>>60, BNP 868, sodium 127.Recent outpatient labs revealed iron of 11, TIBC 532 ferritin , normal folate, normal TSH, normal vitamin B12 and haptoglobin.  CTA of chest abdomen and pelvics on 10/16/2019 showed no acute pathology.  Did show incidental findings of a 1 cm right upper lobe nodule, mild cardiomegaly with advanced three-vessel coronary vascular calcification as well as a right thyroid nodule 2 cm with recommendation for ultrasound.   Review of Systems: As per HPI otherwise all other systems on review of systems negative.    Past Medical History:  Diagnosis Date  . A-fib ([place])   . Allergy   . Anemia   . CHF (congestive heart failure) ([place])   . COPD (chronic obstructive pulmonary disease) ([place])   . Depression   . Feeling grief   . GERD (gastroesophageal reflux disease)   . Hyperlipidemia   . Hypertension   . Hypokalemia 09/06/2016   Last Assessment & Plan:  Formatting  of this note is different from the original. Will continue to monitor; ordered labs as below. Lab Results  Component Value Date   K 3.2 (L) 04/12/2016   K 3.5 04/10/2016   K 4.2 04/09/2016   K 4.4 11/21/2013   K 4.4 01/03/2013  . Myocardial infarction ([place])   . Osteoarthritis   . Post-nasal drip 11/10/2015   Formatting of this note might be  different from the original. Overview:   (Working Diagnosis)  Last Assessment & Plan:  Formatting of this note might be different from the original. Continue OTC flonase 2 sprays daily Likely contributing to cough.  Will see if flonase helps with cough and PND  . Right wrist pain 06/20/2015   Last Assessment & Plan:  Formatting of this note might be different from the original. Significant edema, and some grip and extension weakness, and persistent severe pain. She was not able to tolerate splint Reviewed xr of her recent ED visit No fractures identified (except old fracture on radial head)  +++ snuff box tenderness persists [facility] Information provide  . Vitamin D deficiency     Past Surgical History:  Procedure Laterality Date  . ABDOMINAL HYSTERECTOMY    . BREAST SURGERY    . FOREARM SURGERY       reports that she quit smoking about 15 years ago. Her smoking use included cigarettes. She has a 22.50 pack-year smoking history. She has never used smokeless tobacco. She reports previous drug use. She reports that she does not drink alcohol.  Allergies  Allergen Reactions  . Penicillins Anaphylaxis, Rash and Other (See Comments)  . Iron Other (See Comments)    syncope  . Azithromycin Other (See Comments)    abd pain   . Gemfibrozil Nausea And Vomiting and Rash       . Solifenacin     Mild urinary retention  . Statins Nausea And Vomiting and Rash    Stomach pain      Family History  Problem Relation Age of Onset  . Diabetes Mother   . Hypertension Mother   . Cancer Father        esoph.   Marland Kitchen COPD Sister   . Heart disease Brother   . Early death Son       Prior to Admission medications   Medication Sig Start Date End Date Taking? Authorizing Provider  albuterol (VENTOLIN HFA) 108 (90 Base) MCG/ACT inhaler every 6 (six) hours as needed.     [provider]  ALPRAZolam Duanne Moron) 0.5 MG tablet Take 0.5 mg by mouth 3 (three) times daily  as needed.     [provider]  Cholecalciferol (VITAMIN D) 125 MCG (5000 UT) CAPS Take 1 capsule by mouth daily.     [provider]  cloNIDine (CATAPRES) 0.2 MG tablet  09/27/19   [provider]  Fluticasone-Salmeterol (ADVAIR DISKUS) 250-50 MCG/DOSE AEPB Inhale into the lungs. 04/19/19 10/11/19  [provider]  furosemide (LASIX) 40 MG tablet Take 1 tablet by mouth in the morning and at bedtime. 04/19/19   [provider]  Melatonin 5 MG CHEW Chew 10 mg by mouth.     [provider]  Metoprolol Succinate 200 MG CS24 Take 200 mg by mouth daily.    [provider]  montelukast (SINGULAIR) 10 MG tablet Take by mouth at bedtime.  08/07/15   [provider]  nitroGLYCERIN (NITROSTAT) 0.4 MG SL tablet Place 1 tablet (0.4 mg  total) under the tongue every 5 (five) minutes as needed for chest pain. 10/09/19   Glean Hess, MD  omeprazole (PRILOSEC) 20 MG capsule TAKE 1 CAPSULE (20 MG TOTAL) BY MOUTH 2 (TWO) TIMES DAILY BEFORE A MEAL. 08/30/19   Glean Hess, MD  potassium chloride (KLOR-CON) 10 MEQ tablet Take 10 mEq by mouth 2 (two) times daily.  04/27/19   [provider]  ramipril (ALTACE) 10 MG capsule Take 10 mg by mouth 2 (two) times daily.  09/21/13   [provider]  temazepam (RESTORIL) 30 MG capsule temazepam 30 mg caps    [provider]  traMADol (ULTRAM) 50 MG tablet Take by mouth every 6 (six) hours as needed.    [provider]  vitamin E 180 MG (400 UNITS) capsule Take 400 Units by mouth daily.     [provider]    Physical Exam: Vitals:   10/17/19 2029 10/18/19 0201 10/18/19 0228 10/18/19 0328  BP: (!) 213/74 (!) 192/74 (!) 153/113 (!) 120/94  Pulse: 74 86 80 84  Resp:   18 16  Temp: 97.7 F (36.5 C)     TempSrc: Oral     SpO2: 95% 100% 95% 98%  Weight: 64.9 kg     Height: 5' (1.524 m)        Vitals:   10/17/19 2029 10/18/19 0201 10/18/19 0228 10/18/19  0328  BP: (!) 213/74 (!) 192/74 (!) 153/113 (!) 120/94  Pulse: 74 86 80 84  Resp:   18 16  Temp: 97.7 F (36.5 C)     TempSrc: Oral     SpO2: 95% 100% 95% 98%  Weight: 64.9 kg     Height: 5' (1.524 m)         Constitutional: Alert and oriented x 3 . Not in any apparent distress HEENT:      Head: Normocephalic and atraumatic.         Eyes: PERLA, EOMI, Conjunctivae are normal. Sclera is non-icteric.       Mouth/Throat: Mucous membranes are moist.       Neck: Supple with no signs of meningismus. Cardiovascular: Regular rate and rhythm. No murmurs, gallops, or rubs. 2+ symmetrical distal pulses are present . No JVD. No LE edema Respiratory: Respiratory effort normal .Lungs sounds clear bilaterally. No wheezes, crackles, or rhonchi.  Gastrointestinal: Soft, non tender, and non distended with positive bowel sounds. No rebound or guarding. Genitourinary: No CVA tenderness. Musculoskeletal: Nontender with normal range of motion in all extremities. No cyanosis, or erythema of extremities. Neurologic: Normal speech and language. Face is symmetric. Moving all extremities. No gross focal neurologic deficits . Skin: Skin is warm, dry.  No rash or ulcers Psychiatric: Mood and affect are normal Speech and behavior are normal   Labs on Admission: I have personally reviewed following labs and imaging studies  CBC: Recent Labs  Lab 10/16/19 1437 10/17/19 1830  WBC 8.3 13.9*  NEUTROABS 5.2  --   HGB 6.7* 6.1*  HCT 22.6* 21.3*  MCV 71.3* 72.0*  PLT Not Measured PLATELET CLUMPS NOTED ON SMEAR, UNABLE TO ESTIMATE   Basic Metabolic Panel: Recent Labs  Lab 10/16/19 1437 10/17/19 1830  NA 137 127*  K 3.7 4.4  CL 100 95*  CO2 26 23  GLUCOSE 118* 111*  BUN 13 22  CREATININE 1.10* 1.51*  CALCIUM 9.3 8.7*   GFR: Estimated Creatinine Clearance: 24.2 mL/min (A) (by C-G formula based on SCr of 1.51 mg/dL (H)). Liver Function  Tests: Recent Labs  Lab 10/16/19 1437 10/17/19 1830  AST  20 27  ALT 18 20  ALKPHOS 66 53  BILITOT 0.5 0.6  PROT 7.7 6.8  ALBUMIN 4.0 3.7   No results for input(s): LIPASE, AMYLASE in the last 168 hours. No results for input(s): AMMONIA in the last 168 hours. Coagulation Profile: No results for input(s): INR, PROTIME in the last 168 hours. Cardiac Enzymes: No results for input(s): CKTOTAL, CKMB, CKMBINDEX, TROPONINI in the last 168 hours. BNP (last 3 results) No results for input(s): PROBNP in the last 8760 hours. HbA1C: No results for input(s): HGBA1C in the last 72 hours. CBG: No results for input(s): GLUCAP in the last 168 hours. Lipid Profile: No results for input(s): CHOL, HDL, LDLCALC, TRIG, CHOLHDL, LDLDIRECT in the last 72 hours. Thyroid Function Tests: Recent Labs    10/16/19 1437  TSH 2.840   Anemia Panel: Recent Labs    10/16/19 1437 10/16/19 1438  VITAMINB12  --  281  FOLATE 21.4  --   FERRITIN 10*  --   TIBC 532*  --   IRON 11*  --   RETICCTPCT 2.1  --    Urine analysis:    Component Value Date/Time   BILIRUBINUR neg 10/09/2019 1114   PROTEINUR Negative 10/09/2019 1114   UROBILINOGEN 0.2 10/09/2019 1114   NITRITE neg 10/09/2019 1114   LEUKOCYTESUR Small (1+) (A) 10/09/2019 1114    Radiological Exams on Admission: DG Chest 2 View  Result Date: 10/17/2019 CLINICAL DATA:  Shortness of breath EXAM: CHEST - 2 VIEW COMPARISON:  CT chest 10/16/2019 FINDINGS: There is mild bilateral interstitial thickening. There is no focal consolidation. There is no pleural effusion or pneumothorax. There is cardiomegaly. There is thoracic aortic atherosclerosis. There is a healed right humeral neck fracture. There is a healed left humeral neck fracture transfixed with a metallic sideplate and interlocking screws. IMPRESSION: No active cardiopulmonary disease. Electronically Signed   By: Kathreen Devoid   On: 10/17/2019 19:09   CT Angio Chest/Abd/Pel for Dissection W and/or Wo Contrast  Result Date: 10/16/2019 CLINICAL DATA:   85 year old female with chest pain. Concern for acute aortic syndrome. EXAM: CT ANGIOGRAPHY CHEST, ABDOMEN AND PELVIS TECHNIQUE: Non-contrast CT of the chest was initially obtained. Multidetector CT imaging through the chest, abdomen and pelvis was performed using the standard protocol during bolus administration of intravenous contrast. Multiplanar reconstructed images and MIPs were obtained and reviewed to evaluate the vascular anatomy. CONTRAST:  21mL OMNIPAQUE IOHEXOL 350 MG/ML SOLN COMPARISON:  None. FINDINGS: CTA CHEST FINDINGS Cardiovascular: There is mild cardiomegaly. No pericardial effusion. Advanced 3 vessel coronary vascular calcification. There is advanced atherosclerotic calcification of the thoracic aorta. No aneurysmal dilatation or dissection. The origins of the great vessels of the aortic arch appear patent as visualized. There is mild dilatation of main pulmonary trunk suggestive of pulmonary hypertension. Clinical correlation is recommended. Evaluation of the pulmonary arteries is limited due to suboptimal opacification and timing of the contrast. The central pulmonary arteries appear patent as visualized. Mediastinum/Nodes: Mildly enlarged right hilar lymph node measures 16 cm in short axis. No mediastinal adenopathy. The esophagus is grossly unremarkable. There is a 2 cm heterogeneous right thyroid nodule. Recommend thyroid US (ref: J Am Coll Radiol. 2015 Feb;12(2): 143-50).No mediastinal fluid collection. Lungs/Pleura: There is a 1 cm nodule along the superior aspect of the right major fissure. No focal consolidation, pleural effusion, pneumothorax. The central airways are patent. Musculoskeletal: Mild degenerative changes of the spine as well as  arthritic changes of the shoulders. No acute osseous pathology. Review of the MIP images confirms the above findings. CTA ABDOMEN AND PELVIS FINDINGS VASCULAR Aorta: Advanced atherosclerotic calcification. No dissection or aneurysm. Celiac: There is  atherosclerotic calcification of the origin of the celiac axis. The celiac artery and its main branches are patent. SMA: Atherosclerotic calcification of the proximal SMA. The SMA is patent. Renals: Atherosclerotic calcification of the renal arteries. The renal arteries remain patent. And accessory left renal artery noted. IMA: The IMA is patent. Inflow: Advanced atherosclerotic calcification of the iliac arteries. The iliac arteries remain patent. Veins: No obvious venous abnormality within the limitations of this arterial phase study. Review of the MIP images confirms the above findings. NON-VASCULAR No intra-abdominal free air or free fluid. Hepatobiliary: No focal liver abnormality is seen. No gallstones, gallbladder wall thickening, or biliary dilatation. Pancreas: Unremarkable. No pancreatic ductal dilatation or surrounding inflammatory changes. Spleen: Normal in size without focal abnormality. Adrenals/Urinary Tract: The adrenal glands unremarkable. There is no hydronephrosis on either side. There is thank more atrophy and cortical scarring of the inferior pole of the right kidney. Several left renal cysts measure up to 2.5 cm. Subcentimeter right renal hypodense lesion is too small to characterize. This can be better evaluated with ultrasound on a nonemergent/outpatient basis. The visualized ureters and urinary bladder appear unremarkable. Stomach/Bowel: There is no bowel obstruction or active inflammation. The appendix is normal. Lymphatic: No adenopathy. Reproductive: Hysterectomy. Other: A 3.7 x 2.1 cm chronic appearing complex collection in the subcutaneous soft tissues of the left gluteal region. Additional fluid collection also noted more inferiorly. There is subcutaneous dystrophic calcification. A partially visualized larger fluid collection measuring at least 6.5 cm in the subcutaneous soft tissues of the right buttock. Musculoskeletal: Osteopenia. No acute osseous pathology. Review of the MIP  images confirms the above findings. IMPRESSION: 1. No acute intrathoracic, abdominal, or pelvic pathology. No CT evidence of aortic dissection or aneurysm. 2. A 1 cm right upper lobe nodule along the major fissure. Consider one of the following in 3 months for both low-risk and high-risk individuals: (a) repeat chest CT, (b) follow-up PET-CT, or (c) tissue sampling. This recommendation follows the consensus statement: Guidelines for Management of Incidental Pulmonary Nodules Detected on CT Images: From the Fleischner Society 2017; Radiology 2017; 284:228-243. 3. Mild cardiomegaly with advanced 3 vessel coronary vascular calcification. 4. Mildly enlarged right hilar lymph node, nonspecific. Clinical correlation is recommended. 5. A 2 cm heterogeneous right thyroid nodule. Recommend thyroid US (ref: J Am Coll Radiol. 6. Aortic Atherosclerosis (ICD10-I70.0). Electronically Signed   By: Anner Crete M.D.   On: 10/16/2019 17:09    EKG: Independently reviewed. Interpretation : Normal sinus rhythm, no acute ST-T wave changes  Assessment/Plan Principal Problem:   Symptomatic anemia   Chronic anticoagulation, recent, Xarelto, discontinued on 10/13/19   History of chronic GI blood loss -Patient presents with chest pain and shortness of breath with finding of hemoglobin of 6.1, down from 14.6 about 4 months prior -Intolerant of IV iron on 10/16/2019, though has received IV iron in the past for suspected chronic GI blood loss, per review of records from [facility] -Anemia work-up revealing iron of 11, ferritin 10 and increased TIBC -Type cross and transfuse 2 units packed red blood cells given recent intolerance to IV iron -Stool for occult blood -Clear liquid diet.  IV Protonix -Consider GI evaluation when improved.  Patient has history of esophageal dysphagia but no documented history of peptic ulcer disease.  Chest pain  Elevated troponin   Coronary atherosclerosis of native coronary artery -Patient has  troponin elevation 38>>47>>63>>67, in association with reports of chest pain -Chest pain suspect secondary to symptomatic anemia -Elevated troponin suspect related to demand from symptomatic anemia -Hold aspirin in view of symptomatic anemia -Continue beta-blockers, statins    Hypertensive urgency with mild CHF -BP 208/133.  BNP elevated at 868 but without pulmonary edema on chest x-ray, however patient is having conversational dyspnea -Continue Lasix, beta-blocker, ACE/ARB -Lasix between units of blood -Daily weights, intake and output monitoring -Hydralazine as needed systolic blood pressure over 180  Mild acute on chronic diastolic CHF (congestive heart failure) ([facility]) -Secondary to symptomatic anemia and hypertensive urgency -Management as above    Hyponatremia -Sodium 127, suspect hypervolemic from mild heart failure exacerbation    Paroxysmal atrial fibrillation ([facility]) -Currently in sinus rhythm -Xarelto was discontinued on 10/13/2019 due to symptomatic anemia -Continue rate control agent    Esophageal dysphagia -No acute concerns.  Symptomatic treatment -   Chronic obstructive pulmonary disease, unspecified ([place]) -Not acutely exacerbated -DuoNebs as needed    Pulmonary nodule 1 cm or greater in diameter -Incidental finding on CT from 10/16/2019 -Recommendations per radiology for outpatient follow-up    Thyroid nodule greater than or equal to 1.5 cm in diameter incidentally noted on imaging study -Thyroid ultrasound can be arranged as outpatient    CKD (chronic kidney disease) stage 3, GFR 30-59 ml/min -Creatinine 1.51 which is baseline    DVT prophylaxis: SCDs Code Status: Patient elects to be DNR Family Communication:  none  Disposition Plan: Back to previous home environment Consults called: none  Status:obs    Athena Masse MD [facility]     10/18/2019, 3:40 AM

## 2019-10-18 NOTE — Consult Note (Signed)
Jonathon Bellows , MD [address], [place], [place], [place], [address] [address], [place], [place], [place], [address] Phone: [phone]  Fax: [phone]  Consultation  Referring Provider:     Dr Billie Ruddy  Primary Care Physician:  Glean Hess, MD Primary Gastroenterologist:  Dr. Bonna Gains         Reason for Consultation:     Anemia   Date of Admission:  10/18/2019 Date of Consultation:  10/18/2019         HPI:   Jessica Howell is a 85 y.o. female she was recently seen by Dr. Bonna Gains about a week back.  She was evaluated for microcytic anemia.  Upper GI study in February 2020 at [facility] demonstrated a linear filling defect in the anterior wall of the lower esophagus.  No prior EGD or colonoscopy.  At that time her hemoglobin was 7.2 g with an MCV of 73.  10/16/2019 iron 11, folate 21, ferritin 10, B12 281.  Readmitted today with a hemoglobin of 6 g with an MCV of 70.4, BNP 868, creatinine 1.5, BUN of 22. 10/16/2019 CT angio chest abdomen pelvis for dissection showed a 1 cm right upper lobe nodule along the major fissure.  Mildly enlarged right hilar lymph nodes.  Triple-vessel coronary vascular calcification.  Had been on Xarelto discontinued on 10/13/2019.  Seen Dr. Nehemiah Massed for chest pain and shortness of breath recently.  Elevated troponins.  Seen Dr. Janese Banks for IV iron.  When I went in to see her , she appeared to be short of breath and could not complete a sentence, she stated that she had some blood in her stool last night . Otherwise no other GI issues.   Past Medical History:  Diagnosis Date  . A-fib ([place])   . Allergy   . Anemia   . CHF (congestive heart failure) ([place])   . COPD (chronic obstructive pulmonary disease) ([place])   . Depression   . Feeling grief   . GERD (gastroesophageal reflux disease)   . Hyperlipidemia   . Hypertension   . Hypokalemia 09/06/2016   Last Assessment & Plan:  Formatting of this note is different from the original. Will continue to monitor; ordered  labs as below. Lab Results  Component Value Date   K 3.2 (L) 04/12/2016   K 3.5 04/10/2016   K 4.2 04/09/2016   K 4.4 11/21/2013   K 4.4 01/03/2013  . Myocardial infarction ([place])   . Osteoarthritis   . Post-nasal drip 11/10/2015   Formatting of this note might be different from the original. Overview:   (Working Diagnosis)  Last Assessment & Plan:  Formatting of this note might be different from the original. Continue OTC flonase 2 sprays daily Likely contributing to cough.  Will see if flonase helps with cough and PND  . Right wrist pain 06/20/2015   Last Assessment & Plan:  Formatting of this note might be different from the original. Significant edema, and some grip and extension weakness, and persistent severe pain. She was not able to tolerate splint Reviewed xr of her recent ED visit No fractures identified (except old fracture on radial head)  +++ snuff box tenderness persists [facility]erred to [facility] Information provide  . Vitamin D deficiency     Past Surgical History:  Procedure Laterality Date  . ABDOMINAL HYSTERECTOMY    . BREAST SURGERY    . FOREARM SURGERY      Prior to Admission medications   Medication Sig  Start Date End Date Taking? Authorizing Provider  albuterol (VENTOLIN HFA) 108 (90 Base) MCG/ACT inhaler every 6 (six) hours as needed.    Yes [provider]  ALPRAZolam Duanne Moron) 0.5 MG tablet Take 0.5 mg by mouth 3 (three) times daily as needed.    Yes [provider]  Cholecalciferol (VITAMIN D) 125 MCG (5000 UT) CAPS Take 1 capsule by mouth daily.    Yes [provider]  cloNIDine (CATAPRES) 0.2 MG tablet Take 0.2 mg by mouth at bedtime.  09/27/19  Yes [provider]  Fluticasone-Salmeterol (ADVAIR DISKUS) 250-50 MCG/DOSE AEPB Inhale 1 puff into the lungs 2 (two) times daily.  04/19/19 10/18/19 Yes [provider]  furosemide (LASIX) 40 MG tablet Take 1 tablet by mouth in the morning and at bedtime. 04/19/19  Yes  [provider]  Melatonin 5 MG CHEW Chew 10 mg by mouth.    Yes [provider]  Metoprolol Succinate 200 MG CS24 Take 200 mg by mouth daily.   Yes [provider]  montelukast (SINGULAIR) 10 MG tablet Take by mouth at bedtime.  08/07/15  Yes [provider]  nitroGLYCERIN (NITROSTAT) 0.4 MG SL tablet Place 1 tablet (0.4 mg total) under the tongue every 5 (five) minutes as needed for chest pain. 10/09/19  Yes Glean Hess, MD  omeprazole (PRILOSEC) 20 MG capsule TAKE 1 CAPSULE (20 MG TOTAL) BY MOUTH 2 (TWO) TIMES DAILY BEFORE A MEAL. 08/30/19  Yes Glean Hess, MD  potassium chloride (KLOR-CON) 10 MEQ tablet Take 10 mEq by mouth 2 (two) times daily.  04/27/19  Yes [provider]  ramipril (ALTACE) 10 MG capsule Take 10 mg by mouth 2 (two) times daily.  09/21/13  Yes [provider]  temazepam (RESTORIL) 30 MG capsule Take 30 mg by mouth at bedtime as needed.    Yes [provider]  traMADol (ULTRAM) 50 MG tablet Take by mouth every 6 (six) hours as needed.   Yes [provider]  vitamin E 180 MG (400 UNITS) capsule Take 400 Units by mouth daily.    Yes [provider]    Family History  Problem Relation Age of Onset  . Diabetes Mother   . Hypertension Mother   . Cancer Father        esoph.   Marland Kitchen COPD Sister   . Heart disease Brother   . Early death Son      Social History   Tobacco Use  . Smoking status: Former Smoker    Packs/day: 0.50    Years: 45.00    Pack years: 22.50    Types: Cigarettes    Quit date: 06/10/2004    Years since quitting: 15.3  . Smokeless tobacco: Never Used  Vaping Use  . Vaping Use: Never used  Substance Use Topics  . Alcohol use: Never  . Drug use: Not Currently    Allergies as of 10/17/2019 - Review Complete 10/17/2019  Allergen Reaction Noted  . Penicillins Anaphylaxis, Rash, and Other (See Comments) 12/18/2012  . Iron Other (See Comments) 10/16/2019  .  Azithromycin Other (See Comments) 12/18/2012  . Gemfibrozil Nausea And Vomiting and Rash 06/04/2014  . Solifenacin  06/16/2018  . Statins Nausea And Vomiting and Rash 09/05/2013    Review of Systems:    All systems reviewed and negative except where noted in HPI.   Physical Exam:  Vital signs in last 24 hours: Temp:  [97.7 F (36.5 C)-98.6 F (37 C)] 98.1 F (  36.7 C) (06/24 1131) Pulse Rate:  [71-86] 72 (06/24 1131) Resp:  [16-22] 18 (06/24 1131) BP: (120-213)/(56-113) 178/68 (06/24 1131) SpO2:  [95 %-100 %] 100 % (06/24 1131) Weight:  [64.9 kg-65.1 kg] 65.1 kg (06/24 0619)   General:   Appears short of breath  Head:  Normocephalic and atraumatic. Eyes:   No icterus.   Conjunctiva pink. PERRLA. Ears:  Normal auditory acuity. Neck:  Supple; no masses or thyroidomegaly Lungs:decreased air entry b/l no added sounds  Heart:  Regular rate and rhythm;  Without murmur, clicks, rubs or gallops Abdomen:  Soft, nondistended, nontender. Normal bowel sounds. No appreciable masses or hepatomegaly.  No rebound or guarding.  Neurologic:  Alert and oriented x3;  grossly normal neurologically.  Psych:  Alert and cooperative. Normal affect.  LAB RESULTS: Recent Labs    10/16/19 1437 10/17/19 1830 10/18/19 0419  WBC 8.3 13.9* 11.3*  HGB 6.7* 6.1* 6.0*  HCT 22.6* 21.3* 20.0*  PLT Not Measured PLATELET CLUMPS NOTED ON SMEAR, UNABLE TO ESTIMATE 183   BMET Recent Labs    10/16/19 1437 10/17/19 1830  NA 137 127*  K 3.7 4.4  CL 100 95*  CO2 26 23  GLUCOSE 118* 111*  BUN 13 22  CREATININE 1.10* 1.51*  CALCIUM 9.3 8.7*   LFT Recent Labs    10/17/19 1830  PROT 6.8  ALBUMIN 3.7  AST 27  ALT 20  ALKPHOS 53  BILITOT 0.6   PT/INR No results for input(s): LABPROT, INR in the last 72 hours.  STUDIES: DG Chest 2 View  Result Date: 10/17/2019 CLINICAL DATA:  Shortness of breath EXAM: CHEST - 2 VIEW COMPARISON:  CT chest 10/16/2019 FINDINGS: There is mild bilateral interstitial  thickening. There is no focal consolidation. There is no pleural effusion or pneumothorax. There is cardiomegaly. There is thoracic aortic atherosclerosis. There is a healed right humeral neck fracture. There is a healed left humeral neck fracture transfixed with a metallic sideplate and interlocking screws. IMPRESSION: No active cardiopulmonary disease. Electronically Signed   By: Kathreen Devoid   On: 10/17/2019 19:09   CT Angio Chest/Abd/Pel for Dissection W and/or Wo Contrast  Result Date: 10/16/2019 CLINICAL DATA:  85 year old female with chest pain. Concern for acute aortic syndrome. EXAM: CT ANGIOGRAPHY CHEST, ABDOMEN AND PELVIS TECHNIQUE: Non-contrast CT of the chest was initially obtained. Multidetector CT imaging through the chest, abdomen and pelvis was performed using the standard protocol during bolus administration of intravenous contrast. Multiplanar reconstructed images and MIPs were obtained and reviewed to evaluate the vascular anatomy. CONTRAST:  54mL OMNIPAQUE IOHEXOL 350 MG/ML SOLN COMPARISON:  None. FINDINGS: CTA CHEST FINDINGS Cardiovascular: There is mild cardiomegaly. No pericardial effusion. Advanced 3 vessel coronary vascular calcification. There is advanced atherosclerotic calcification of the thoracic aorta. No aneurysmal dilatation or dissection. The origins of the great vessels of the aortic arch appear patent as visualized. There is mild dilatation of main pulmonary trunk suggestive of pulmonary hypertension. Clinical correlation is recommended. Evaluation of the pulmonary arteries is limited due to suboptimal opacification and timing of the contrast. The central pulmonary arteries appear patent as visualized. Mediastinum/Nodes: Mildly enlarged right hilar lymph node measures 16 cm in short axis. No mediastinal adenopathy. The esophagus is grossly unremarkable. There is a 2 cm heterogeneous right thyroid nodule. Recommend thyroid US (ref: J Am Coll Radiol. 2015 Feb;12(2): 143-50).No  mediastinal fluid collection. Lungs/Pleura: There is a 1 cm nodule along the superior aspect of the right major fissure. No focal consolidation, pleural  effusion, pneumothorax. The central airways are patent. Musculoskeletal: Mild degenerative changes of the spine as well as arthritic changes of the shoulders. No acute osseous pathology. Review of the MIP images confirms the above findings. CTA ABDOMEN AND PELVIS FINDINGS VASCULAR Aorta: Advanced atherosclerotic calcification. No dissection or aneurysm. Celiac: There is atherosclerotic calcification of the origin of the celiac axis. The celiac artery and its main branches are patent. SMA: Atherosclerotic calcification of the proximal SMA. The SMA is patent. Renals: Atherosclerotic calcification of the renal arteries. The renal arteries remain patent. And accessory left renal artery noted. IMA: The IMA is patent. Inflow: Advanced atherosclerotic calcification of the iliac arteries. The iliac arteries remain patent. Veins: No obvious venous abnormality within the limitations of this arterial phase study. Review of the MIP images confirms the above findings. NON-VASCULAR No intra-abdominal free air or free fluid. Hepatobiliary: No focal liver abnormality is seen. No gallstones, gallbladder wall thickening, or biliary dilatation. Pancreas: Unremarkable. No pancreatic ductal dilatation or surrounding inflammatory changes. Spleen: Normal in size without focal abnormality. Adrenals/Urinary Tract: The adrenal glands unremarkable. There is no hydronephrosis on either side. There is thank more atrophy and cortical scarring of the inferior pole of the right kidney. Several left renal cysts measure up to 2.5 cm. Subcentimeter right renal hypodense lesion is too small to characterize. This can be better evaluated with ultrasound on a nonemergent/outpatient basis. The visualized ureters and urinary bladder appear unremarkable. Stomach/Bowel: There is no bowel obstruction or  active inflammation. The appendix is normal. Lymphatic: No adenopathy. Reproductive: Hysterectomy. Other: A 3.7 x 2.1 cm chronic appearing complex collection in the subcutaneous soft tissues of the left gluteal region. Additional fluid collection also noted more inferiorly. There is subcutaneous dystrophic calcification. A partially visualized larger fluid collection measuring at least 6.5 cm in the subcutaneous soft tissues of the right buttock. Musculoskeletal: Osteopenia. No acute osseous pathology. Review of the MIP images confirms the above findings. IMPRESSION: 1. No acute intrathoracic, abdominal, or pelvic pathology. No CT evidence of aortic dissection or aneurysm. 2. A 1 cm right upper lobe nodule along the major fissure. Consider one of the following in 3 months for both low-risk and high-risk individuals: (a) repeat chest CT, (b) follow-up PET-CT, or (c) tissue sampling. This recommendation follows the consensus statement: Guidelines for Management of Incidental Pulmonary Nodules Detected on CT Images: From the Fleischner Society 2017; Radiology 2017; 284:228-243. 3. Mild cardiomegaly with advanced 3 vessel coronary vascular calcification. 4. Mildly enlarged right hilar lymph node, nonspecific. Clinical correlation is recommended. 5. A 2 cm heterogeneous right thyroid nodule. Recommend thyroid US (ref: J Am Coll Radiol. 6. Aortic Atherosclerosis (ICD10-I70.0). Electronically Signed   By: Anner Crete M.D.   On: 10/16/2019 17:09      Impression / Plan:   Jessica Howell is a 85 y.o. y/o female with iron deficiency anemia recently evaluated by GI and hematology.  Receiving IV iron and had a reaction after infusion and hence was admitted into the hospital.  Noted to have lower than baseline hemoglobin with microcytosis and very low ferritin.  Admitted with heart failure as well.  GI evaluation is pending.  Recent upper GI series in February 2020 per care everywhere performed at [facility] which shows  abnormality in the lower end of the esophagus.  At that time a hemoglobin of 13 g.  Plan 1.  Monitor CBC and transfuse 2.  IV iron. 3.  Once shortness of breath has resolved and pulmonary edema is improved can consider an  upper endoscopy.  If negative can proceed with colonoscopy.Ihave informed Dr Billie Ruddy on [facility] that she appears short of breath at this time. Unsure if this is better or worse than what she was on admission.   Thank you for involving me in the care of this patient.      LOS: 0 days   Jonathon Bellows, MD  10/18/2019, 11:38 AM

## 2019-10-19 ENCOUNTER — Encounter: Payer: Self-pay | Admitting: Oncology

## 2019-10-19 DIAGNOSIS — Z66 Do not resuscitate: Secondary | ICD-10-CM | POA: Diagnosis present

## 2019-10-19 DIAGNOSIS — D508 Other iron deficiency anemias: Secondary | ICD-10-CM | POA: Diagnosis not present

## 2019-10-19 DIAGNOSIS — I480 Paroxysmal atrial fibrillation: Secondary | ICD-10-CM | POA: Diagnosis present

## 2019-10-19 DIAGNOSIS — D649 Anemia, unspecified: Secondary | ICD-10-CM | POA: Diagnosis not present

## 2019-10-19 DIAGNOSIS — E785 Hyperlipidemia, unspecified: Secondary | ICD-10-CM | POA: Diagnosis present

## 2019-10-19 DIAGNOSIS — R933 Abnormal findings on diagnostic imaging of other parts of digestive tract: Secondary | ICD-10-CM | POA: Diagnosis not present

## 2019-10-19 DIAGNOSIS — E871 Hypo-osmolality and hyponatremia: Secondary | ICD-10-CM | POA: Diagnosis present

## 2019-10-19 DIAGNOSIS — D509 Iron deficiency anemia, unspecified: Secondary | ICD-10-CM | POA: Diagnosis present

## 2019-10-19 DIAGNOSIS — N179 Acute kidney failure, unspecified: Secondary | ICD-10-CM | POA: Diagnosis present

## 2019-10-19 DIAGNOSIS — I5033 Acute on chronic diastolic (congestive) heart failure: Secondary | ICD-10-CM | POA: Diagnosis present

## 2019-10-19 DIAGNOSIS — I2510 Atherosclerotic heart disease of native coronary artery without angina pectoris: Secondary | ICD-10-CM | POA: Diagnosis present

## 2019-10-19 DIAGNOSIS — K219 Gastro-esophageal reflux disease without esophagitis: Secondary | ICD-10-CM | POA: Diagnosis present

## 2019-10-19 DIAGNOSIS — K922 Gastrointestinal hemorrhage, unspecified: Secondary | ICD-10-CM

## 2019-10-19 DIAGNOSIS — R0789 Other chest pain: Secondary | ICD-10-CM | POA: Diagnosis present

## 2019-10-19 DIAGNOSIS — E870 Hyperosmolality and hypernatremia: Secondary | ICD-10-CM | POA: Diagnosis present

## 2019-10-19 DIAGNOSIS — M1990 Unspecified osteoarthritis, unspecified site: Secondary | ICD-10-CM | POA: Diagnosis present

## 2019-10-19 DIAGNOSIS — M546 Pain in thoracic spine: Secondary | ICD-10-CM | POA: Diagnosis present

## 2019-10-19 DIAGNOSIS — F329 Major depressive disorder, single episode, unspecified: Secondary | ICD-10-CM | POA: Diagnosis present

## 2019-10-19 DIAGNOSIS — D126 Benign neoplasm of colon, unspecified: Secondary | ICD-10-CM | POA: Diagnosis not present

## 2019-10-19 DIAGNOSIS — I130 Hypertensive heart and chronic kidney disease with heart failure and stage 1 through stage 4 chronic kidney disease, or unspecified chronic kidney disease: Secondary | ICD-10-CM | POA: Diagnosis present

## 2019-10-19 DIAGNOSIS — E041 Nontoxic single thyroid nodule: Secondary | ICD-10-CM | POA: Diagnosis present

## 2019-10-19 DIAGNOSIS — M549 Dorsalgia, unspecified: Secondary | ICD-10-CM | POA: Diagnosis present

## 2019-10-19 DIAGNOSIS — M810 Age-related osteoporosis without current pathological fracture: Secondary | ICD-10-CM | POA: Diagnosis present

## 2019-10-19 DIAGNOSIS — F419 Anxiety disorder, unspecified: Secondary | ICD-10-CM | POA: Diagnosis present

## 2019-10-19 DIAGNOSIS — Z20822 Contact with and (suspected) exposure to covid-19: Secondary | ICD-10-CM | POA: Diagnosis present

## 2019-10-19 DIAGNOSIS — R000 Tachycardia, unspecified: Secondary | ICD-10-CM | POA: Diagnosis present

## 2019-10-19 DIAGNOSIS — I248 Other forms of acute ischemic heart disease: Secondary | ICD-10-CM | POA: Diagnosis present

## 2019-10-19 DIAGNOSIS — N183 Chronic kidney disease, stage 3 unspecified: Secondary | ICD-10-CM | POA: Diagnosis present

## 2019-10-19 DIAGNOSIS — J449 Chronic obstructive pulmonary disease, unspecified: Secondary | ICD-10-CM | POA: Diagnosis present

## 2019-10-19 HISTORY — DX: Gastrointestinal hemorrhage, unspecified: K92.2

## 2019-10-19 LAB — BASIC METABOLIC PANEL
Anion gap: 6 (ref 5–15)
BUN: 15 mg/dL (ref 8–23)
CO2: 27 mmol/L (ref 22–32)
Calcium: 8.9 mg/dL (ref 8.9–10.3)
Chloride: 104 mmol/L (ref 98–111)
Creatinine, Ser: 1.2 mg/dL — ABNORMAL HIGH (ref 0.44–1.00)
GFR calc Af Amer: 49 mL/min — ABNORMAL LOW (ref >60)
GFR calc non Af Amer: 42 mL/min — ABNORMAL LOW (ref >60)
Glucose, Bld: 97 mg/dL (ref 70–99)
Potassium: 3.8 mmol/L (ref 3.5–5.1)
Sodium: 137 mmol/L (ref 135–145)

## 2019-10-19 LAB — MULTIPLE MYELOMA PANEL, SERUM
Albumin SerPl Elph-Mcnc: 3.9 g/dL (ref 2.9–4.4)
Albumin/Glob SerPl: 1.4 (ref 0.7–1.7)
Alpha 1: 0.3 g/dL (ref 0.0–0.4)
Alpha2 Glob SerPl Elph-Mcnc: 0.9 g/dL (ref 0.4–1.0)
B-Globulin SerPl Elph-Mcnc: 1 g/dL (ref 0.7–1.3)
Gamma Glob SerPl Elph-Mcnc: 0.8 g/dL (ref 0.4–1.8)
Globulin, Total: 3 g/dL (ref 2.2–3.9)
IgA: 132 mg/dL (ref 64–422)
IgG (Immunoglobin G), Serum: 784 mg/dL (ref 586–1602)
IgM (Immunoglobulin M), Srm: 302 mg/dL — ABNORMAL HIGH (ref 26–217)
Total Protein ELP: 6.9 g/dL (ref 6.0–8.5)

## 2019-10-19 LAB — CBC
HCT: 24.4 % — ABNORMAL LOW (ref 36.0–46.0)
Hemoglobin: 7.9 g/dL — ABNORMAL LOW (ref 12.0–15.0)
MCH: 23 pg — ABNORMAL LOW (ref 26.0–34.0)
MCHC: 32.4 g/dL (ref 30.0–36.0)
MCV: 70.9 fL — ABNORMAL LOW (ref 80.0–100.0)
Platelets: UNDETERMINED 10*3/uL (ref 150–400)
RBC: 3.44 MIL/uL — ABNORMAL LOW (ref 3.87–5.11)
RDW: 17.3 % — ABNORMAL HIGH (ref 11.5–15.5)
WBC: 6.7 10*3/uL (ref 4.0–10.5)
nRBC: 0 % (ref 0.0–0.2)

## 2019-10-19 LAB — MAGNESIUM: Magnesium: 2.4 mg/dL (ref 1.7–2.4)

## 2019-10-19 MED ORDER — FUROSEMIDE 10 MG/ML IJ SOLN
40.0000 mg | Freq: Once | INTRAMUSCULAR | Status: AC
  Administered 2019-10-19: 40 mg via INTRAVENOUS
  Filled 2019-10-19: qty 4

## 2019-10-19 MED ORDER — HYDRALAZINE HCL 50 MG PO TABS
50.0000 mg | ORAL_TABLET | Freq: Three times a day (TID) | ORAL | Status: DC
  Administered 2019-10-19 – 2019-10-22 (×7): 50 mg via ORAL
  Filled 2019-10-19 (×10): qty 1

## 2019-10-19 NOTE — Telephone Encounter (Signed)
FYI: Dr. Bonna Gains, patient had been in the hospital since 10/16/2019. So as of now, we do not have clearance and have not been able to schedule her for an EGD.

## 2019-10-19 NOTE — Progress Notes (Signed)
Jonathon Bellows , MD [address] [place], [place], [place], [address] [address], [place], [place], [place], [address] Phone: [phone]  Fax: [phone]   Jessica Howell is being followed for anemia  Day 1 of follow up   Subjective: Looks much better and comfortable, not anxious or in distress   Objective: Vital signs in last 24 hours: Vitals:   10/18/19 2010 10/19/19 0429 10/19/19 0611 10/19/19 0857  BP: (!) 182/81 (!) 194/80 (!) 174/85 (!) 200/85  Pulse: 82 70 73 75  Resp: 20 20  16   Temp: 98.3 F (36.8 C) 98.4 F (36.9 C)  98.6 F (37 C)  TempSrc: Oral Oral    SpO2: 100% 100%  99%  Weight:      Height:       Weight change:   Intake/Output Summary (Last 24 hours) at 10/19/2019 1228 Last data filed at 10/19/2019 1123 Gross per 24 hour  Intake 1700 ml  Output 2350 ml  Net -650 ml     Exam: Heart:: Regular rate and rhythm, S1S2 present or without murmur or extra heart sounds Lungs: normal, clear to auscultation and clear to auscultation and percussion Abdomen: soft, nontender, normal bowel sounds Vitals:   10/19/19 0611 10/19/19 0857  BP: (!) 174/85 (!) 200/85  Pulse: 73 75  Resp:  16  Temp:  98.6 F (37 C)  SpO2:  99%     Lab Results: @LABTEST2 @ Micro Results: Recent Results (from the past 240 hour(s))  SARS Coronavirus 2 by RT PCR (hospital order, performed in [place] hospital lab) Nasopharyngeal Nasopharyngeal Swab     Status: None   Collection Time: 10/18/19  4:00 AM   Specimen: Nasopharyngeal Swab  Result Value Ref Range Status   SARS Coronavirus 2 NEGATIVE NEGATIVE Final    Comment: (NOTE) SARS-CoV-2 target nucleic acids are NOT DETECTED.  The SARS-CoV-2 RNA is generally detectable in upper and lower respiratory specimens during the acute phase of infection. The lowest concentration of SARS-CoV-2 viral copies this assay can detect is 250 copies / mL. A negative result does not preclude SARS-CoV-2 infection and should not be used  as the sole basis for treatment or other patient management decisions.  A negative result may occur with improper specimen collection / handling, submission of specimen other than nasopharyngeal swab, presence of viral mutation(s) within the areas targeted by this assay, and inadequate number of viral copies (<250 copies / mL). A negative result must be combined with clinical observations, patient history, and epidemiological information.  Fact Sheet for Patients:   StrictlyIdeas.no  Fact Sheet for Healthcare Providers: BankingDealers.co.za  This test is not yet approved or  cleared by the [place] FDA and has been authorized for detection and/or diagnosis of SARS-CoV-2 by FDA under an Emergency Use Authorization (EUA).  This EUA will remain in effect (meaning this test can be used) for the duration of the COVID-19 declaration under Section 564(b)(1) of the Act, 21 U.S.C. section 360bbb-3(b)(1), unless the authorization is terminated or revoked sooner.  Performed at [facility], [address] [place], [place] [address]    Studies/Results: [place] Chest 2 View  Result Date: 10/17/2019 CLINICAL DATA:  Shortness of breath EXAM: CHEST - 2 VIEW COMPARISON:  CT chest 10/16/2019 FINDINGS: There is mild bilateral interstitial thickening. There is no focal consolidation. There is no pleural effusion or pneumothorax. There is cardiomegaly. There is thoracic aortic atherosclerosis. There is a healed right humeral neck fracture. There is a healed left  humeral neck fracture transfixed with a metallic sideplate and interlocking screws. IMPRESSION: No active cardiopulmonary disease. Electronically Signed   By: Kathreen Devoid   On: 10/17/2019 19:09   Medications: I have reviewed the patient's current medications. Scheduled Meds: . sodium chloride   Intravenous Once  . fluticasone furoate-vilanterol  1 puff Inhalation Daily  . furosemide  40  mg Intravenous Once  . gabapentin  300 mg Oral BID  . hydrALAZINE  50 mg Oral Q8H  . metoprolol succinate  200 mg Oral Daily  . pantoprazole (PROTONIX) IV  40 mg Intravenous Q12H  . sucralfate  1 g Oral TID WC & HS   Continuous Infusions: PRN Meds:.acetaminophen **OR** acetaminophen, ALPRAZolam, cyclobenzaprine, diphenhydrAMINE, fluticasone, melatonin, morphine injection, ondansetron **OR** ondansetron (ZOFRAN) IV, temazepam, traMADol   Assessment: Principal Problem:   Symptomatic anemia Active Problems:   Paroxysmal atrial fibrillation (HCC)   Essential (primary) hypertension   Esophageal dysphagia   Coronary atherosclerosis of native coronary artery   Chronic obstructive pulmonary disease, unspecified (HCC)   Elevated troponin   Chronic anticoagulation, recent, Xarelto    Chronic diastolic CHF (congestive heart failure) (HCC)   Hypertensive urgency   Hyponatremia   Pulmonary nodule 1 cm or greater in diameter   Thyroid nodule greater than or equal to 1.5 cm in diameter incidentally noted on imaging study   CKD (chronic kidney disease) stage 3, GFR 30-59 ml/min   History of GI bleed  Jessica Howell is a 85 y.o. y/o female with iron deficiency anemia recently evaluated by GI and hematology.  Receiving IV iron and had a reaction after infusion and hence was admitted into the hospital.  Noted to have lower than baseline hemoglobin with microcytosis and very low ferritin.  Admitted with heart failure as well.  GI evaluation is pending.  Recent upper GI series in February 2020 per care everywhere performed at [facility] which shows abnormality in the lower end of the esophagus.  At that time a hemoglobin of 13 g.On admission had very high BP, elevated troponin, chest pains, AKI   Plan 1.  Monitor CBC and transfuse as needed 2.  EGD to be performed after cardiac clearance - if obtained today can be done tomorrow .   I have discussed alternative options, risks & benefits,  which include, but  are not limited to, bleeding, infection, perforation,respiratory complication & drug reaction.  The patient agrees with this plan & written consent will be obtained.       LOS: 0 days   Jonathon Bellows, MD 10/19/2019, 12:28 PM

## 2019-10-19 NOTE — Consult Note (Signed)
Cardiology Consultation Note    Patient ID: Jessica Howell, MRN: [id number], DOB/AGE: Mar 13, 1935 85 y.o. Admit date: 10/18/2019   Date of Consult: 10/19/2019 Primary Physician: Glean Hess, MD Primary Cardiologist: Dr. Nehemiah Massed  Chief Complaint: gi bleed Reason for Consultation: preop Requesting MD: Dr. Billie Ruddy  HPI: Jessica Howell is a 85 y.o. female with history of CAD per chart, HTN, paroxysmal afib previously on xarelto discontinued on 6/19/221, admitted after presenting with chest pain. In the er her hgb was 7.2 down.   Echo at [facility] in 04/2018 showed normal lv function with ef of 55%. Had mild aortic sclerosis with no stenosis. She was evaluated at [facility] er with negative cta of the aorta and ruled out for an mi. She had hgb of 6.9 on 6/22. SHe is now scheduled for egd/colonscopy.  Troponins have been only minimally elevated and flat.  Does not appear to represent any acute ischemic coronary disease.  EKG reveals sinus rhythm at 78.  Patient is hemodynamically stable.  She is able to ambulate around her house and she states is able to climb stairs without difficulty.  She does not awaken from sleep with chest pain.  Past Medical History:  Diagnosis Date  . A-fib ([place])   . Allergy   . Anemia   . CHF (congestive heart failure) ([place])   . COPD (chronic obstructive pulmonary disease) ([place])   . Depression   . Feeling grief   . GERD (gastroesophageal reflux disease)   . Hyperlipidemia   . Hypertension   . Hypokalemia 09/06/2016   Last Assessment & Plan:  Formatting of this note is different from the original. Will continue to monitor; ordered labs as below. Lab Results  Component Value Date   K 3.2 (L) 04/12/2016   K 3.5 04/10/2016   K 4.2 04/09/2016   K 4.4 11/21/2013   K 4.4 01/03/2013  . Myocardial infarction ([place])   . Osteoarthritis   . Post-nasal drip 11/10/2015   Formatting of this note might be different from the original. Overview:   (Working Diagnosis)  Last Assessment & Plan:   Formatting of this note might be different from the original. Continue OTC flonase 2 sprays daily Likely contributing to cough.  Will see if flonase helps with cough and PND  . Right wrist pain 06/20/2015   Last Assessment & Plan:  Formatting of this note might be different from the original. Significant edema, and some grip and extension weakness, and persistent severe pain. She was not able to tolerate splint Reviewed xr of her recent ED visit No fractures identified (except old fracture on radial head)  +++ snuff box tenderness persists [facility]erred to [facility] Information provide  . Vitamin D deficiency       Surgical History:  Past Surgical History:  Procedure Laterality Date  . ABDOMINAL HYSTERECTOMY    . BREAST SURGERY    . FOREARM SURGERY       Home Meds: Prior to Admission medications   Medication Sig Start Date End Date Taking? Authorizing Provider  albuterol (VENTOLIN HFA) 108 (90 Base) MCG/ACT inhaler every 6 (six) hours as needed.    Yes [provider]  ALPRAZolam Duanne Moron) 0.5 MG tablet Take 0.5 mg by mouth 3 (three) times daily as needed.    Yes [provider]  Cholecalciferol (VITAMIN D) 125 MCG (5000 UT) CAPS Take 1 capsule by mouth daily.    Yes [provider]  cloNIDine (CATAPRES) 0.2 MG tablet  Take 0.2 mg by mouth at bedtime.  09/27/19  Yes [provider]  Fluticasone-Salmeterol (ADVAIR DISKUS) 250-50 MCG/DOSE AEPB Inhale 1 puff into the lungs 2 (two) times daily.  04/19/19 10/18/19 Yes [provider]  furosemide (LASIX) 40 MG tablet Take 1 tablet by mouth in the morning and at bedtime. 04/19/19  Yes [provider]  Melatonin 5 MG CHEW Chew by mouth.    Yes [provider]  Metoprolol Succinate 200 MG CS24 Take 200 mg by mouth daily.   Yes [provider]  montelukast (SINGULAIR) 10 MG tablet Take by mouth at bedtime.  08/07/15  Yes [provider]  nitroGLYCERIN (NITROSTAT)  0.4 MG SL tablet Place 1 tablet (0.4 mg total) under the tongue every 5 (five) minutes as needed for chest pain. 10/09/19  Yes Glean Hess, MD  omeprazole (PRILOSEC) 20 MG capsule TAKE 1 CAPSULE (20 MG TOTAL) BY MOUTH 2 (TWO) TIMES DAILY BEFORE A MEAL. 08/30/19  Yes Glean Hess, MD  potassium chloride (KLOR-CON) 10 MEQ tablet Take 10 mEq by mouth 2 (two) times daily.  04/27/19  Yes [provider]  ramipril (ALTACE) 10 MG capsule Take 10 mg by mouth 2 (two) times daily.  09/21/13  Yes [provider]  temazepam (RESTORIL) 30 MG capsule Take 30 mg by mouth at bedtime as needed.    Yes [provider]  traMADol (ULTRAM) 50 MG tablet Take by mouth every 6 (six) hours as needed.   Yes [provider]  vitamin E 180 MG (400 UNITS) capsule Take 400 Units by mouth daily.    Yes [provider]    Inpatient Medications:  . sodium chloride   Intravenous Once  . fluticasone furoate-vilanterol  1 puff Inhalation Daily  . gabapentin  300 mg Oral BID  . hydrALAZINE  50 mg Oral Q8H  . metoprolol succinate  200 mg Oral Daily  . pantoprazole (PROTONIX) IV  40 mg Intravenous Q12H  . sucralfate  1 g Oral TID WC & HS     Allergies:  Allergies  Allergen Reactions  . Penicillins Anaphylaxis, Rash and Other (See Comments)  . Iron Other (See Comments)    syncope  . Azithromycin Other (See Comments)    abd pain   . Gemfibrozil Nausea And Vomiting and Rash       . Solifenacin     Mild urinary retention  . Statins Nausea And Vomiting and Rash    Stomach pain      Social History   Socioeconomic History  . Marital status: Widowed    Spouse name: Not on file  . Number of children: 1  . Years of education: Not on file  . Highest education level: Not on file  Occupational History  . Occupation: retired  Tobacco Use  . Smoking status: Former Smoker    Packs/day: 0.50    Years: 45.00    Pack years: 22.50    Types: Cigarettes    Quit date:  06/10/2004    Years since quitting: 15.3  . Smokeless tobacco: Never Used  Vaping Use  . Vaping Use: Never used  Substance and Sexual Activity  . Alcohol use: Never  . Drug use: Not Currently  . Sexual activity: Not Currently  Other Topics Concern  . Not on file  Social History Narrative   Living alone. Husband passed away in 12-17-2018. Daughter checking in on her and great grandson checks in also.    Social Determinants  of Health   Financial Resource Strain: Low Risk   . Difficulty of Paying Living Expenses: Not very hard  Food Insecurity: No Food Insecurity  . Worried About Charity fundraiser in the Last Year: Never true  . Ran Out of Food in the Last Year: Never true  Transportation Needs: No Transportation Needs  . Lack of Transportation (Medical): No  . Lack of Transportation (Non-Medical): No  Physical Activity: Inactive  . Days of Exercise per Week: 0 days  . Minutes of Exercise per Session: 0 min  Stress: Stress Concern Present  . Feeling of Stress : Very much  Social Connections: Unknown  . Frequency of Communication with Friends and Family: Patient refused  . Frequency of Social Gatherings with Friends and Family: Patient refused  . Attends Religious Services: Patient refused  . Active Member of Clubs or Organizations: Patient refused  . Attends Archivist Meetings: Patient refused  . Marital Status: Widowed  Intimate Partner Violence: Not At Risk  . Fear of Current or Ex-Partner: No  . Emotionally Abused: No  . Physically Abused: No  . Sexually Abused: No     Family History  Problem Relation Age of Onset  . Diabetes Mother   . Hypertension Mother   . Cancer Father        esoph.   Marland Kitchen COPD Sister   . Heart disease Brother   . Early death Son      Review of Systems: A 12-system review of systems was performed and is negative except as noted in the HPI.  Labs: No results for input(s): CKTOTAL, CKMB, TROPONINI in the last 72 hours. Lab Results   Component Value Date   WBC 6.7 10/19/2019   HGB 7.9 (L) 10/19/2019   HCT 24.4 (L) 10/19/2019   MCV 70.9 (L) 10/19/2019   PLT PLATELET CLUMPS NOTED ON SMEAR, UNABLE TO ESTIMATE 10/19/2019    Recent Labs  Lab 10/17/19 1830 10/17/19 1830 10/19/19 0647  NA 127*   < > 137  K 4.4   < > 3.8  CL 95*   < > 104  CO2 23   < > 27  BUN 22   < > 15  CREATININE 1.51*   < > 1.20*  CALCIUM 8.7*   < > 8.9  PROT 6.8  --   --   BILITOT 0.6  --   --   ALKPHOS 53  --   --   ALT 20  --   --   AST 27  --   --   GLUCOSE 111*   < > 97   < > = values in this interval not displayed.   Lab Results  Component Value Date   CHOL 227 (H) 10/09/2019   HDL 48 10/09/2019   LDLCALC 156 (H) 10/09/2019   TRIG 129 10/09/2019   No results found for: DDIMER  Radiology/Studies:  DG Chest 2 View  Result Date: 10/17/2019 CLINICAL DATA:  Shortness of breath EXAM: CHEST - 2 VIEW COMPARISON:  CT chest 10/16/2019 FINDINGS: There is mild bilateral interstitial thickening. There is no focal consolidation. There is no pleural effusion or pneumothorax. There is cardiomegaly. There is thoracic aortic atherosclerosis. There is a healed right humeral neck fracture. There is a healed left humeral neck fracture transfixed with a metallic sideplate and interlocking screws. IMPRESSION: No active cardiopulmonary disease. Electronically Signed   By: Kathreen Devoid   On: 10/17/2019 19:09   CT Angio Chest/Abd/Pel for Dissection W  and/or Wo Contrast  Result Date: 10/16/2019 CLINICAL DATA:  85 year old female with chest pain. Concern for acute aortic syndrome. EXAM: CT ANGIOGRAPHY CHEST, ABDOMEN AND PELVIS TECHNIQUE: Non-contrast CT of the chest was initially obtained. Multidetector CT imaging through the chest, abdomen and pelvis was performed using the standard protocol during bolus administration of intravenous contrast. Multiplanar reconstructed images and MIPs were obtained and reviewed to evaluate the vascular anatomy. CONTRAST:   5mL OMNIPAQUE IOHEXOL 350 MG/ML SOLN COMPARISON:  None. FINDINGS: CTA CHEST FINDINGS Cardiovascular: There is mild cardiomegaly. No pericardial effusion. Advanced 3 vessel coronary vascular calcification. There is advanced atherosclerotic calcification of the thoracic aorta. No aneurysmal dilatation or dissection. The origins of the great vessels of the aortic arch appear patent as visualized. There is mild dilatation of main pulmonary trunk suggestive of pulmonary hypertension. Clinical correlation is recommended. Evaluation of the pulmonary arteries is limited due to suboptimal opacification and timing of the contrast. The central pulmonary arteries appear patent as visualized. Mediastinum/Nodes: Mildly enlarged right hilar lymph node measures 16 cm in short axis. No mediastinal adenopathy. The esophagus is grossly unremarkable. There is a 2 cm heterogeneous right thyroid nodule. Recommend thyroid US (ref: J Am Coll Radiol. 2015 Feb;12(2): 143-50).No mediastinal fluid collection. Lungs/Pleura: There is a 1 cm nodule along the superior aspect of the right major fissure. No focal consolidation, pleural effusion, pneumothorax. The central airways are patent. Musculoskeletal: Mild degenerative changes of the spine as well as arthritic changes of the shoulders. No acute osseous pathology. Review of the MIP images confirms the above findings. CTA ABDOMEN AND PELVIS FINDINGS VASCULAR Aorta: Advanced atherosclerotic calcification. No dissection or aneurysm. Celiac: There is atherosclerotic calcification of the origin of the celiac axis. The celiac artery and its main branches are patent. SMA: Atherosclerotic calcification of the proximal SMA. The SMA is patent. Renals: Atherosclerotic calcification of the renal arteries. The renal arteries remain patent. And accessory left renal artery noted. IMA: The IMA is patent. Inflow: Advanced atherosclerotic calcification of the iliac arteries. The iliac arteries remain patent.  Veins: No obvious venous abnormality within the limitations of this arterial phase study. Review of the MIP images confirms the above findings. NON-VASCULAR No intra-abdominal free air or free fluid. Hepatobiliary: No focal liver abnormality is seen. No gallstones, gallbladder wall thickening, or biliary dilatation. Pancreas: Unremarkable. No pancreatic ductal dilatation or surrounding inflammatory changes. Spleen: Normal in size without focal abnormality. Adrenals/Urinary Tract: The adrenal glands unremarkable. There is no hydronephrosis on either side. There is thank more atrophy and cortical scarring of the inferior pole of the right kidney. Several left renal cysts measure up to 2.5 cm. Subcentimeter right renal hypodense lesion is too small to characterize. This can be better evaluated with ultrasound on a nonemergent/outpatient basis. The visualized ureters and urinary bladder appear unremarkable. Stomach/Bowel: There is no bowel obstruction or active inflammation. The appendix is normal. Lymphatic: No adenopathy. Reproductive: Hysterectomy. Other: A 3.7 x 2.1 cm chronic appearing complex collection in the subcutaneous soft tissues of the left gluteal region. Additional fluid collection also noted more inferiorly. There is subcutaneous dystrophic calcification. A partially visualized larger fluid collection measuring at least 6.5 cm in the subcutaneous soft tissues of the right buttock. Musculoskeletal: Osteopenia. No acute osseous pathology. Review of the MIP images confirms the above findings. IMPRESSION: 1. No acute intrathoracic, abdominal, or pelvic pathology. No CT evidence of aortic dissection or aneurysm. 2. A 1 cm right upper lobe nodule along the major fissure. Consider one of the following in  3 months for both low-risk and high-risk individuals: (a) repeat chest CT, (b) follow-up PET-CT, or (c) tissue sampling. This recommendation follows the consensus statement: Guidelines for Management of  Incidental Pulmonary Nodules Detected on CT Images: From the Fleischner Society 2017; Radiology 2017; 284:228-243. 3. Mild cardiomegaly with advanced 3 vessel coronary vascular calcification. 4. Mildly enlarged right hilar lymph node, nonspecific. Clinical correlation is recommended. 5. A 2 cm heterogeneous right thyroid nodule. Recommend thyroid US (ref: J Am Coll Radiol. 6. Aortic Atherosclerosis (ICD10-I70.0). Electronically Signed   By: Anner Crete M.D.   On: 10/16/2019 17:09    Wt Readings from Last 3 Encounters:  10/18/19 65.1 kg  10/17/19 65 kg  10/16/19 65 kg    EKG: Sinus rhythm with nonspecific ST-T wave changes.  No ischemia.  Physical Exam:caucasian female in no distress Blood pressure (!) 198/64, pulse 73, temperature 98.2 F (36.8 C), temperature source Oral, resp. rate 18, height 5' (1.524 m), weight 65.1 kg, SpO2 96 %. Body mass index is 28.04 kg/m. General: Well developed, well nourished, in no acute distress. Head: Normocephalic, atraumatic, sclera non-icteric, no xanthomas, nares are without discharge.  Neck: Negative for carotid bruits. JVD not elevated. Lungs: Clear bilaterally to auscultation without wheezes, rales, or rhonchi. Breathing is unlabored. Heart: RRR with S1 S2. No murmurs, rubs, or gallops appreciated. Abdomen: Soft, non-tender, non-distended with normoactive bowel sounds. No hepatomegaly. No rebound/guarding. No obvious abdominal masses. Msk:  Strength and tone appear normal for age. Extremities: No clubbing or cyanosis. No edema.  Distal pedal pulses are 2+ and equal bilaterally. Neuro: Alert and oriented X 3. No facial asymmetry. No focal deficit. Moves all extremities spontaneously. Psych:  Responds to questions appropriately with a normal affect.     Assessment and Plan  85 year old female with history of paroxysmal atrial fibrillation, HFpEF, recent echo at [facility] showing preserved LV function with no significant valvular disease admitted with  shortness of breath and some chest heaviness.  Appears to rule out for myocardial infarction with minimally elevated but flat troponins.  EKG was unremarkable.  Patient is pain-free at present.  She states that she ambulates upstairs and around her house without chest pain.  She does not wake from sleep with chest pain.  She had been on Xarelto but this was discontinued last week.  Would proceed with EGD/colonoscopy as planned with routine cardiac monitoring.  Patient appears to be optimized from a cardiac standpoint for the procedure.  No further cardiac work-up indicated prior to the procedure.  Signed, Teodoro Spray MD 10/19/2019, 1:56 PM Pager: [phone]

## 2019-10-19 NOTE — Progress Notes (Signed)
PROGRESS NOTE    Jessica Howell  ALP:[id number] DOB: 01/23/1935 DOA: 10/18/2019 PCP: Glean Hess, MD    Assessment & Plan:   Principal Problem:   Symptomatic anemia Active Problems:   Paroxysmal atrial fibrillation (HCC)   Essential (primary) hypertension   Esophageal dysphagia   Coronary atherosclerosis of native coronary artery   Chronic obstructive pulmonary disease, unspecified (HCC)   Elevated troponin   Chronic anticoagulation, recent, Xarelto    Chronic diastolic CHF (congestive heart failure) (HCC)   Hypertensive urgency   Hyponatremia   Pulmonary nodule 1 cm or greater in diameter   Thyroid nodule greater than or equal to 1.5 cm in diameter incidentally noted on imaging study   CKD (chronic kidney disease) stage 3, GFR 30-59 ml/min   History of GI bleed   Upper GI bleed    Jessica Howell is a 85 y.o. female with medical history significant for CAD, COPD, diastolic CHF, HTN, depression, paroxysmal atrial fibrillation, recently on Xarelto, discontinued on 10/13/2019 by Dr. Nehemiah Massed secondary to anemia, and with history of need for IV iron in the past for anemia from suspected chronic GI losses, who presents to the emergency room with a several month history of episodic retrosternal chest pain, dyspnea on exertion for which she was recently undergoing extensive evaluation.   Dyspnea and chest pain  2/2 Symptomatic anemia History of chronic GI blood loss -Patient presented with chest pain and shortness of breath with finding of hemoglobin of 6.1, down from 14.6 about 4 months prior.  Pt had increased RR. --Pt was recently on Xarelto, discontinued on 10/13/19 due to dropping Hgb.   -Intolerant of IV iron on 10/16/2019, though has received IV iron in the past for suspected chronic GI blood loss, per review of records from [facility] -Anemia work-up revealing iron of 11, ferritin 10 and increased TIBC --1u pRBC on 6/24 with pain episode afterwards. PLAN: --GI to perform EGD  tomorrow  --consult cardiology for cardiac clearance, per request of GI -Clear liquid diet.   --continue IV Protonix BID  Acute on chronic diastolic CHF exacerbation --Pt presented with dyspnea, elevated respiratory rate, elevated BNP to 868, hypernatremia and AKI, likely due to volume overload and congestion.  Respiratory rate, Na, Cr all improved after IV lasix yesterday, further supporting that pt was fluid overloaded. --Last complete Echo in Dec 2019 showed grade II diastolic dysfunction. PLAN: --continue IV lasix 40 mg today --Strict I/O  Chest pain, ACS ruled out Elevated troponin 2/2 demand ischemia Coronary atherosclerosis of native coronary artery -Patient has troponin elevation 38>>47>>63>>67>>60>>48, in association with reports of chest pain -Chest pain suspect epigastric pain from probable gastritis PLAN: -Continue beta-blockers, statins  AKI, improved --BP elevated, labs did not suggest dehydration.  BNP elevated, so likely due to congestion, especially since Cr improved after IV lasix. PLAN: --continue IV lasix 40 mg today --Strict I/O --Hold home ramipril     Hypertensive urgency  -BP 208/133 on presentation, and remained elevated.   PLAN: --continue home metop --Hold home ramipril 2/2 AKI -IV lasix 40 x1  -start PO hydralazine 50 mg q8 -IV Hydralazine as needed systolic blood pressure over 180    Hyponatremia due to hypervolemia, resolved -Sodium 127, suspect hypervolemic from heart failure exacerbation.  Improved to 137 with IV lasix.    Paroxysmal atrial fibrillation (HCC) -Currently in sinus rhythm -Xarelto was discontinued on 10/13/2019 due to symptomatic anemia -Continue home metop    Esophageal dysphagia -No acute concerns.     Chronic obstructive  pulmonary disease, unspecified ([place]) -Not acutely exacerbated -continue home daily bronchodilators    Pulmonary nodule 1 cm or greater in diameter -Incidental finding on CT from  10/16/2019 -Recommendations per radiology for outpatient follow-up    Thyroid nodule greater than or equal to 1.5 cm in diameter incidentally noted on imaging study -Thyroid ultrasound can be arranged as outpatient  Anxiety and depression --followed by outpatient psych.  Pt's husband recently passed away.  Pt lives alone and feels anxious a lot. PLAN: --continue home Xanax PRN and Restoril PRN  Chronic pain   Likely neuropathy --Pt takes Tramadol PRN for whole-body aches.  Also complained of pain in her feet, which could be neuropathy. PLAN: --continue home tramadol PRN --Trial gabapentin 300 mg BID to start   DVT prophylaxis: SCD/Compression stockings Code Status: DNR  Family Communication:  Status is: changed to inpatient today Dispo:   The patient is from: home Anticipated d/c is to: home Anticipated d/c date is: in 1-2 days Patient currently is not medically stable to d/c due to: need work up for likely GI bleed, and currently receiving IV lasix for CHF exacerbation.   Subjective and Interval History:  Pt said she was "drunk" when woken up today.  No fever, chest pain, abdominal pain, N/V/D.    Objective: Vitals:   10/19/19 0611 10/19/19 0857 10/19/19 1228 10/19/19 1606  BP: (!) 174/85 (!) 200/85 (!) 198/64 (!) 173/59  Pulse: 73 75 73 76  Resp:  16 18 18   Temp:  98.6 F (37 C) 98.2 F (36.8 C) 98 F (36.7 C)  TempSrc:   Oral Oral  SpO2:  99% 96% 100%  Weight:      Height:        Intake/Output Summary (Last 24 hours) at 10/19/2019 1834 Last data filed at 10/19/2019 1811 Gross per 24 hour  Intake 2195 ml  Output 4900 ml  Net -2705 ml   Filed Weights   10/17/19 1825 10/17/19 2029 10/18/19 0619  Weight: 65 kg 64.9 kg 65.1 kg    Examination:   Constitutional: NAD, sleepy, oriented HEENT: conjunctivae and lids normal, EOMI CV: RRR no M,R,G. Distal pulses +2.  No cyanosis.   RESP: CTA B/L, on RA GI: +BS, NTND Extremities: No effusions, edema in  BLE SKIN: warm, dry and intact Neuro: II - XII grossly intact.  Sensation intact   Data Reviewed: I have personally reviewed following labs and imaging studies  CBC: Recent Labs  Lab 10/16/19 1437 10/17/19 1830 10/18/19 0419 10/18/19 1305 10/19/19 0647  WBC 8.3 13.9* 11.3*  --  6.7  NEUTROABS 5.2  --   --   --   --   HGB 6.7* 6.1* 6.0* 7.4* 7.9*  HCT 22.6* 21.3* 20.0* 24.0* 24.4*  MCV 71.3* 72.0* 70.4*  --  70.9*  PLT Not Measured PLATELET CLUMPS NOTED ON SMEAR, UNABLE TO ESTIMATE 183  --  PLATELET CLUMPS NOTED ON SMEAR, UNABLE TO ESTIMATE   Basic Metabolic Panel: Recent Labs  Lab 10/16/19 1437 10/17/19 1830 10/19/19 0647  NA 137 127* 137  K 3.7 4.4 3.8  CL 100 95* 104  CO2 26 23 27   GLUCOSE 118* 111* 97  BUN 13 22 15   CREATININE 1.10* 1.51* 1.20*  CALCIUM 9.3 8.7* 8.9  MG  --   --  2.4   GFR: Estimated Creatinine Clearance: 30.4 mL/min (A) (by C-G formula based on SCr of 1.2 mg/dL (H)). Liver Function Tests: Recent Labs  Lab 10/16/19 1437 10/17/19 1830  AST  20 27  ALT 18 20  ALKPHOS 66 53  BILITOT 0.5 0.6  PROT 7.7 6.8  ALBUMIN 4.0 3.7   No results for input(s): LIPASE, AMYLASE in the last 168 hours. No results for input(s): AMMONIA in the last 168 hours. Coagulation Profile: No results for input(s): INR, PROTIME in the last 168 hours. Cardiac Enzymes: No results for input(s): CKTOTAL, CKMB, CKMBINDEX, TROPONINI in the last 168 hours. BNP (last 3 results) No results for input(s): PROBNP in the last 8760 hours. HbA1C: No results for input(s): HGBA1C in the last 72 hours. CBG: No results for input(s): GLUCAP in the last 168 hours. Lipid Profile: No results for input(s): CHOL, HDL, LDLCALC, TRIG, CHOLHDL, LDLDIRECT in the last 72 hours. Thyroid Function Tests: No results for input(s): TSH, T4TOTAL, FREET4, T3FREE, THYROIDAB in the last 72 hours. Anemia Panel: No results for input(s): VITAMINB12, FOLATE, FERRITIN, TIBC, IRON, RETICCTPCT in the last 72  hours. Sepsis Labs: No results for input(s): PROCALCITON, LATICACIDVEN in the last 168 hours.  Recent Results (from the past 240 hour(s))  SARS Coronavirus 2 by RT PCR (hospital order, performed in [facility] hospital lab) Nasopharyngeal Nasopharyngeal Swab     Status: None   Collection Time: 10/18/19  4:00 AM   Specimen: Nasopharyngeal Swab  Result Value Ref Range Status   SARS Coronavirus 2 NEGATIVE NEGATIVE Final    Comment: (NOTE) SARS-CoV-2 target nucleic acids are NOT DETECTED.  The SARS-CoV-2 RNA is generally detectable in upper and lower respiratory specimens during the acute phase of infection. The lowest concentration of SARS-CoV-2 viral copies this assay can detect is 250 copies / mL. A negative result does not preclude SARS-CoV-2 infection and should not be used as the sole basis for treatment or other patient management decisions.  A negative result may occur with improper specimen collection / handling, submission of specimen other than nasopharyngeal swab, presence of viral mutation(s) within the areas targeted by this assay, and inadequate number of viral copies (<250 copies / mL). A negative result must be combined with clinical observations, patient history, and epidemiological information.  Fact Sheet for Patients:   StrictlyIdeas.no  Fact Sheet for Healthcare Providers: BankingDealers.co.za  This test is not yet approved or  cleared by the [place] FDA and has been authorized for detection and/or diagnosis of SARS-CoV-2 by FDA under an Emergency Use Authorization (EUA).  This EUA will remain in effect (meaning this test can be used) for the duration of the COVID-19 declaration under Section 564(b)(1) of the Act, 21 U.S.C. section 360bbb-3(b)(1), unless the authorization is terminated or revoked sooner.  Performed at [facility], [address], [place], [place] [address]       Radiology  Studies: DG Chest 2 View  Result Date: 10/17/2019 CLINICAL DATA:  Shortness of breath EXAM: CHEST - 2 VIEW COMPARISON:  CT chest 10/16/2019 FINDINGS: There is mild bilateral interstitial thickening. There is no focal consolidation. There is no pleural effusion or pneumothorax. There is cardiomegaly. There is thoracic aortic atherosclerosis. There is a healed right humeral neck fracture. There is a healed left humeral neck fracture transfixed with a metallic sideplate and interlocking screws. IMPRESSION: No active cardiopulmonary disease. Electronically Signed   By: Kathreen Devoid   On: 10/17/2019 19:09     Scheduled Meds:  sodium chloride   Intravenous Once   fluticasone furoate-vilanterol  1 puff Inhalation Daily   gabapentin  300 mg Oral BID   hydrALAZINE  50 mg Oral Q8H   metoprolol  succinate  200 mg Oral Daily   pantoprazole (PROTONIX) IV  40 mg Intravenous Q12H   sucralfate  1 g Oral TID WC & HS   Continuous Infusions:   LOS: 0 days     Enzo Bi, MD [facility] If 7PM-7AM, please contact night-coverage 10/19/2019, 6:34 PM

## 2019-10-20 ENCOUNTER — Inpatient Hospital Stay: Payer: Medicare HMO | Admitting: Anesthesiology

## 2019-10-20 ENCOUNTER — Encounter
Admission: EM | Disposition: A | Discharge status: Home / Self Care | Payer: Self-pay | Source: Home / Self Care | Attending: Hospitalist

## 2019-10-20 DIAGNOSIS — R933 Abnormal findings on diagnostic imaging of other parts of digestive tract: Secondary | ICD-10-CM

## 2019-10-20 HISTORY — PX: ESOPHAGOGASTRODUODENOSCOPY (EGD) WITH PROPOFOL: SHX5813

## 2019-10-20 LAB — CBC
HCT: 29.6 % — ABNORMAL LOW (ref 36.0–46.0)
Hemoglobin: 9 g/dL — ABNORMAL LOW (ref 12.0–15.0)
MCH: 22.3 pg — ABNORMAL LOW (ref 26.0–34.0)
MCHC: 30.4 g/dL (ref 30.0–36.0)
MCV: 73.3 fL — ABNORMAL LOW (ref 80.0–100.0)
Platelets: 148 10*3/uL — ABNORMAL LOW (ref 150–400)
RBC: 4.04 MIL/uL (ref 3.87–5.11)
RDW: 18 % — ABNORMAL HIGH (ref 11.5–15.5)
WBC: 6 10*3/uL (ref 4.0–10.5)
nRBC: 0 % (ref 0.0–0.2)

## 2019-10-20 LAB — BASIC METABOLIC PANEL
Anion gap: 6 (ref 5–15)
BUN: 10 mg/dL (ref 8–23)
CO2: 27 mmol/L (ref 22–32)
Calcium: 9.2 mg/dL (ref 8.9–10.3)
Chloride: 100 mmol/L (ref 98–111)
Creatinine, Ser: 1.27 mg/dL — ABNORMAL HIGH (ref 0.44–1.00)
GFR calc Af Amer: 46 mL/min — ABNORMAL LOW (ref >60)
GFR calc non Af Amer: 39 mL/min — ABNORMAL LOW (ref >60)
Glucose, Bld: 134 mg/dL — ABNORMAL HIGH (ref 70–99)
Potassium: 3.3 mmol/L — ABNORMAL LOW (ref 3.5–5.1)
Sodium: 133 mmol/L — ABNORMAL LOW (ref 135–145)

## 2019-10-20 LAB — MAGNESIUM: Magnesium: 2.3 mg/dL (ref 1.7–2.4)

## 2019-10-20 SURGERY — ESOPHAGOGASTRODUODENOSCOPY (EGD) WITH PROPOFOL
Anesthesia: General

## 2019-10-20 MED ORDER — FENTANYL CITRATE (PF) 100 MCG/2ML IJ SOLN
INTRAMUSCULAR | Status: AC
  Filled 2019-10-20: qty 2

## 2019-10-20 MED ORDER — PROPOFOL 500 MG/50ML IV EMUL
INTRAVENOUS | Status: DC | PRN
  Administered 2019-10-20: 75 ug/kg/min via INTRAVENOUS

## 2019-10-20 MED ORDER — FENTANYL CITRATE (PF) 100 MCG/2ML IJ SOLN
INTRAMUSCULAR | Status: DC | PRN
  Administered 2019-10-20: 25 ug via INTRAVENOUS

## 2019-10-20 MED ORDER — FUROSEMIDE 10 MG/ML IJ SOLN
40.0000 mg | Freq: Once | INTRAMUSCULAR | Status: AC
  Administered 2019-10-20: 40 mg via INTRAVENOUS
  Filled 2019-10-20: qty 4

## 2019-10-20 MED ORDER — MAGNESIUM CITRATE PO SOLN
1.0000 | Freq: Once | ORAL | Status: AC
  Administered 2019-10-20: 1 via ORAL
  Filled 2019-10-20: qty 296

## 2019-10-20 MED ORDER — PEG 3350-KCL-NA BICARB-NACL 420 G PO SOLR
4000.0000 mL | Freq: Once | ORAL | Status: AC
  Administered 2019-10-20: 4000 mL via ORAL
  Filled 2019-10-20: qty 4000

## 2019-10-20 MED ORDER — PROPOFOL 500 MG/50ML IV EMUL
INTRAVENOUS | Status: AC
  Filled 2019-10-20: qty 50

## 2019-10-20 MED ORDER — LIDOCAINE HCL (CARDIAC) PF 100 MG/5ML IV SOSY
PREFILLED_SYRINGE | INTRAVENOUS | Status: DC | PRN
  Administered 2019-10-20: 50 mg via INTRAVENOUS

## 2019-10-20 MED ORDER — SODIUM CHLORIDE 0.9 % IV SOLN
INTRAVENOUS | Status: DC
Start: 2019-10-20 — End: 2019-10-23

## 2019-10-20 MED ORDER — PROPOFOL 10 MG/ML IV BOLUS
INTRAVENOUS | Status: DC | PRN
  Administered 2019-10-20: 40 mg via INTRAVENOUS

## 2019-10-20 MED ORDER — LIDOCAINE HCL (PF) 2 % IJ SOLN
INTRAMUSCULAR | Status: AC
  Filled 2019-10-20: qty 5

## 2019-10-20 MED ORDER — CYANOCOBALAMIN 1000 MCG/ML IJ SOLN
1000.0000 ug | Freq: Once | INTRAMUSCULAR | Status: AC
  Administered 2019-10-20: 1000 ug via SUBCUTANEOUS
  Filled 2019-10-20: qty 1

## 2019-10-20 MED ORDER — SODIUM CHLORIDE 0.9 % IV SOLN
300.0000 mg | Freq: Once | INTRAVENOUS | Status: AC
  Administered 2019-10-20: 300 mg via INTRAVENOUS
  Filled 2019-10-20: qty 15

## 2019-10-20 MED ORDER — POTASSIUM CHLORIDE CRYS ER 20 MEQ PO TBCR
40.0000 meq | EXTENDED_RELEASE_TABLET | Freq: Once | ORAL | Status: AC
  Administered 2019-10-20: 40 meq via ORAL
  Filled 2019-10-20: qty 2

## 2019-10-20 NOTE — Anesthesia Postprocedure Evaluation (Signed)
Anesthesia Post Note  Patient: Jessica Howell  Procedure(s) Performed: ESOPHAGOGASTRODUODENOSCOPY (EGD) WITH PROPOFOL (N/A )  Patient location during evaluation: PACU Anesthesia Type: General Level of consciousness: awake and alert Pain management: pain level controlled Vital Signs Assessment: post-procedure vital signs reviewed and stable Respiratory status: spontaneous breathing, nonlabored ventilation, respiratory function stable and patient connected to nasal cannula oxygen Cardiovascular status: blood pressure returned to baseline and stable Postop Assessment: no apparent nausea or vomiting Anesthetic complications: no   No complications documented.   Last Vitals:  Vitals:   10/20/19 1011 10/20/19 1019  BP: (!) 154/66 (!) 164/60  Pulse: 70 68  Resp: 18 18  Temp:    SpO2: 100% 100%    Last Pain:  Vitals:   10/20/19 1019  TempSrc:   PainSc: 0-No pain                 Arita Miss

## 2019-10-20 NOTE — Progress Notes (Signed)
EGD post procedure note  EGD is unremarkable  Recommendations: Given history of severe iron deficiency anemia, recommend colonoscopy +/- VCE and patient is agreeable We will try Venofer instead of Feraheme as recommended by Dr. Janese Banks, administered slowly with close monitoring Administer B12 injection 1069mcg Clear liquid diet today, n.p.o. past midnight Bowel prep ordered  Tentative plan for colonoscopy tomorrow by Dr. Vicente Males based on the bowel prep  Cephas Darby, MD [address]  [place]  [place], [place] [address]  Main: [phone]  Fax: [phone] Pager: [phone]

## 2019-10-20 NOTE — Op Note (Signed)
[facility] Gastroenterology Patient Name: Jessica Howell Procedure Date: 10/20/2019 9:37 AM MRN: [id number] Account #: [id number] Date of Birth: 06-Apr-1935 Admit Type: Inpatient Age: 85 Room: [facility] ENDO ROOM 4 Gender: Female Note Status: Finalized Procedure:             Upper GI endoscopy Indications:           Abnormal UGI series Providers:             Lin Landsman MD, MD Referring MD:          No Local Md, MD (Referring MD) Medicines:             Monitored Anesthesia Care Complications:         No immediate complications. Estimated blood loss: None. Procedure:             Pre-Anesthesia Assessment:                        - Prior to the procedure, a History and Physical was                         performed, and patient medications and allergies were                         reviewed. The patient is competent. The risks and                         benefits of the procedure and the sedation options and                         risks were discussed with the patient. All questions                         were answered and informed consent was obtained.                         Patient identification and proposed procedure were                         verified by the physician, the nurse, the                         anesthesiologist, the anesthetist and the technician                         in the pre-procedure area in the procedure room in the                         endoscopy suite. Mental Status Examination: alert and                         oriented. Airway Examination: normal oropharyngeal                         airway and neck mobility. Respiratory Examination:                         clear to auscultation. CV Examination: normal.  Prophylactic Antibiotics: The patient does not require                         prophylactic antibiotics. Prior Anticoagulants: The                         patient has taken no previous anticoagulant or                          antiplatelet agents. ASA Grade Assessment: III - A                         patient with severe systemic disease. After reviewing                         the risks and benefits, the patient was deemed in                         satisfactory condition to undergo the procedure. The                         anesthesia plan was to use monitored anesthesia care                         (MAC). Immediately prior to administration of                         medications, the patient was re-assessed for adequacy                         to receive sedatives. The heart rate, respiratory                         rate, oxygen saturations, blood pressure, adequacy of                         pulmonary ventilation, and response to care were                         monitored throughout the procedure. The physical                         status of the patient was re-assessed after the                         procedure.                        After obtaining informed consent, the endoscope was                         passed under direct vision. Throughout the procedure,                         the patient's blood pressure, pulse, and oxygen                         saturations were monitored continuously. The Endoscope  was introduced through the mouth, and advanced to the                         second part of duodenum. The upper GI endoscopy was                         accomplished without difficulty. The patient tolerated                         the procedure well. Findings:      The esophagus was normal.      The stomach was normal.      The examined duodenum was normal. Impression:            - Normal esophagus.                        - Normal stomach.                        - Normal examined duodenum.                        - No specimens collected. Recommendation:        - Return patient to hospital ward for possible                         discharge same day.                         - Resume regular diet today. Procedure Code(s):     --- Professional ---                        [phone], Esophagogastroduodenoscopy, flexible,                         transoral; diagnostic, including collection of                         specimen(s) by brushing or washing, when performed                         (separate procedure) Diagnosis Code(s):     --- Professional ---                        R93.3, Abnormal findings on diagnostic imaging of                         other parts of digestive tract CPT copyright 2019 American Medical Association. All rights reserved. The codes documented in this report are preliminary and upon coder review may  be revised to meet current compliance requirements. Dr. Ulyess Mort Lin Landsman MD, MD 10/20/2019 9:47:16 AM This report has been signed electronically. Number of Addenda: 0 Note Initiated On: 10/20/2019 9:37 AM Estimated Blood Loss:  Estimated blood loss: none.      [facility]

## 2019-10-20 NOTE — Progress Notes (Signed)
Need telemetry bed. Moved to 102. Telephoned daughter of move.

## 2019-10-20 NOTE — Progress Notes (Signed)
Patient refuses bed alarms at this time, patient educated about safety precautions.

## 2019-10-20 NOTE — Anesthesia Preprocedure Evaluation (Addendum)
Anesthesia Evaluation  Patient identified by MRN, date of birth, ID band Patient awake    Reviewed: Allergy & Precautions, NPO status , Patient's Chart, lab work & pertinent test results  History of Anesthesia Complications Negative for: history of anesthetic complications  Airway Mallampati: III  TM Distance: >3 FB Neck ROM: Full    Dental  (+) Upper Dentures, Lower Dentures   Pulmonary neg sleep apnea, COPD, Patient abstained from smoking.Not current smoker, former smoker,    Pulmonary exam normal breath sounds clear to auscultation       Cardiovascular Exercise Tolerance: Good METShypertension, + CAD, + Past MI and +CHF  + dysrhythmias Atrial Fibrillation  Rhythm:Regular Rate:Normal - Systolic murmurs Per cardiologist note: Echo at [facility] in 04/2018 showed normal lv function with ef of 55%. Had mild aortic sclerosis with no stenosis. She was evaluated at [facility] er with negative cta of the aorta and ruled out for an mi. Per cardiology, no further preop cardiac workup necessary.   Neuro/Psych PSYCHIATRIC DISORDERS Depression negative neurological ROS     GI/Hepatic GERD  ,(+)     (-) substance abuse  ,   Endo/Other  neg diabetes  Renal/GU Renal InsufficiencyRenal disease     Musculoskeletal  (+) Arthritis ,   Abdominal   Peds  Hematology  (+) anemia ,   Anesthesia Other Findings Past Medical History: No date: A-fib ([place]) No date: Allergy No date: Anemia No date: CHF (congestive heart failure) (HCC) No date: COPD (chronic obstructive pulmonary disease) (HCC) No date: Depression No date: Feeling grief No date: GERD (gastroesophageal reflux disease) No date: Hyperlipidemia No date: Hypertension 09/06/2016: Hypokalemia     Comment:  Last Assessment & Plan:  Formatting of this note is               different from the original. Will continue to monitor;               ordered labs as below. Lab Results  Component  Value Date               K 3.2 (L) 04/12/2016   K 3.5 04/10/2016   K 4.2               04/09/2016   K 4.4 11/21/2013   K 4.4 01/03/2013 No date: Myocardial infarction [facility]) No date: Osteoarthritis 11/10/2015: Post-nasal drip     Comment:  Formatting of this note might be different from the               original. Overview:   (Working Diagnosis)  Last               Assessment & Plan:  Formatting of this note might be               different from the original. Continue OTC flonase 2               sprays daily Likely contributing to cough.  Will see if               flonase helps with cough and PND 06/20/2015: Right wrist pain     Comment:  Last Assessment & Plan:  Formatting of this note might               be different from the original. Significant edema, and               some grip and extension weakness, and persistent severe  pain. She was not able to tolerate splint Reviewed xr of               her recent ED visit No fractures identified (except old               fracture on radial head)  +++ snuff box tenderness               persists [facility]               Information provide No date: Vitamin D deficiency  Reproductive/Obstetrics                            Anesthesia Physical Anesthesia Plan  ASA: III  Anesthesia Plan: General   Post-op Pain Management:    Induction: Intravenous  PONV Risk Score and Plan: 3 and Ondansetron, Propofol infusion and TIVA  Airway Management Planned: Nasal Cannula  Additional Equipment: None  Intra-op Plan:   Post-operative Plan:   Informed Consent: I have reviewed the patients History and Physical, chart, labs and discussed the procedure including the risks, benefits and alternatives for the proposed anesthesia with the patient or authorized representative who has indicated his/her understanding and acceptance.   Patient has DNR.  Discussed DNR with patient and Suspend  DNR.   Dental advisory given  Plan Discussed with: CRNA and Surgeon  Anesthesia Plan Comments: (Discussed risks of anesthesia with patient, including possibility of difficulty with spontaneous ventilation under anesthesia necessitating airway intervention, PONV, and rare risks such as cardiac or respiratory or neurological events. Patient understands. Discussed DNR in detail with patient; patient's wishes are to suspend DNR perioperatively and treat her as needed, and revert back to DNR afterwards.)        Anesthesia Quick Evaluation

## 2019-10-20 NOTE — Transfer of Care (Signed)
2Immediate Anesthesia Transfer of Care Note  Patient: Jessica Howell  Procedure(s) Performed: ESOPHAGOGASTRODUODENOSCOPY (EGD) WITH PROPOFOL (N/A )  Patient Location: PACU  Anesthesia Type:General  Level of Consciousness: drowsy  Airway & Oxygen Therapy: Patient Spontanous Breathing and Patient connected to nasal cannula oxygen  Post-op Assessment: Report given to RN and Post -op Vital signs reviewed and stable  Post vital signs: Reviewed  Last Vitals:  Vitals Value Taken Time  BP    Temp    Pulse 67 10/20/19 0953  Resp 22 10/20/19 0953  SpO2 98 % 10/20/19 0953  Vitals shown include unvalidated device data.  Last Pain:  Vitals:   10/20/19 0800  TempSrc:   PainSc: Asleep         Complications: No complications documented.

## 2019-10-20 NOTE — Progress Notes (Signed)
PROGRESS NOTE    Jessica Howell  QZR:[id number] DOB: 1935-01-22 DOA: 10/18/2019 PCP: Glean Hess, MD    Assessment & Plan:   Principal Problem:   Symptomatic anemia Active Problems:   Paroxysmal atrial fibrillation (HCC)   Essential (primary) hypertension   Esophageal dysphagia   Coronary atherosclerosis of native coronary artery   Chronic obstructive pulmonary disease, unspecified (HCC)   Elevated troponin   Chronic anticoagulation, recent, Xarelto    Chronic diastolic CHF (congestive heart failure) (HCC)   Hypertensive urgency   Hyponatremia   Pulmonary nodule 1 cm or greater in diameter   Thyroid nodule greater than or equal to 1.5 cm in diameter incidentally noted on imaging study   CKD (chronic kidney disease) stage 3, GFR 30-59 ml/min   History of GI bleed   Upper GI bleed    Jessica Howell is a 85 y.o. Caucasian female with medical history significant for CAD, COPD, diastolic CHF, HTN, depression, paroxysmal atrial fibrillation, recently on Xarelto, discontinued on 10/13/2019 by Dr. Nehemiah Massed secondary to anemia, and with history of need for IV iron in the past for anemia from suspected chronic GI losses, who presents to the emergency room with a several month history of episodic retrosternal chest pain, dyspnea on exertion for which she was recently undergoing extensive evaluation.   Dyspnea and chest pain 2/2 Symptomatic anemia History of chronic GI blood loss -Patient presented with chest pain and shortness of breath with finding of hemoglobin of 6.1, down from 14.6 about 4 months prior.  Pt had increased RR. --Pt was recently on Xarelto, discontinued on 10/13/19 due to dropping Hgb.   -Had pain episode after IV iron on 10/16/2019, though has received IV iron in the past for suspected chronic GI blood loss, per review of records from [facility] -Anemia work-up revealing iron of 11, ferritin 10 and increased TIBC --1u pRBC on 6/24 with pain episode afterwards. PLAN: --EGD  today, normal findings -prep for colonoscopy tomorrow --d/c IV Protonix BID  Acute on chronic diastolic CHF exacerbation --Pt presented with dyspnea, elevated respiratory rate, elevated BNP to 868, hypernatremia and AKI, likely due to volume overload and congestion.  Respiratory rate, Na, Cr all improved after IV lasix yesterday, further supporting that pt was fluid overloaded. --Last complete Echo in Dec 2019 showed grade II diastolic dysfunction. PLAN: --continue IV lasix 40 mg today --Strict I/O  Chest pain, ACS ruled out Elevated troponin 2/2 demand ischemia Coronary atherosclerosis of native coronary artery -Patient has troponin elevation 38>>47>>63>>67>>60>>48, in association with reports of chest pain -Chest pain suspect epigastric pain from probable gastritis PLAN: -Continue beta-blockers, statins  AKI, improved --BP elevated, labs did not suggest dehydration.  BNP elevated, so likely due to congestion, especially since Cr improved after IV lasix. PLAN: --continue IV lasix 40 mg today --Strict I/O --Hold home ramipril     Hypertensive urgency  -BP 208/133 on presentation, and remained elevated.   PLAN: --continue home metop --Hold home ramipril 2/2 AKI -IV lasix 40 x1  --continue PO hydralazine 50 mg q8 (new) -IV Hydralazine as needed systolic blood pressure over 180    Hyponatremia due to hypervolemia -Sodium 127, suspect hypervolemic from heart failure exacerbation.  Improved to 137 with IV lasix.    Paroxysmal atrial fibrillation (HCC) -Currently in sinus rhythm -Xarelto was discontinued on 10/13/2019 due to symptomatic anemia -Continue home metop    Esophageal dysphagia -No acute concerns.     Chronic obstructive pulmonary disease, unspecified ([place]) -Not acutely exacerbated -continue home daily  bronchodilators    Pulmonary nodule 1 cm or greater in diameter -Incidental finding on CT from 10/16/2019 -Recommendations per radiology for outpatient  follow-up    Thyroid nodule greater than or equal to 1.5 cm in diameter incidentally noted on imaging study -Thyroid ultrasound can be arranged as outpatient  Anxiety and depression --followed by outpatient psych.  Pt's husband recently passed away.  Pt lives alone and feels anxious a lot. PLAN: --continue home Xanax PRN and Restoril PRN  Chronic pain   Likely neuropathy --Pt takes Tramadol PRN for whole-body aches.  Also complained of pain in her feet, which could be neuropathy. PLAN: --continue home tramadol PRN --Trial gabapentin 300 mg BID to start   DVT prophylaxis: SCD/Compression stockings Code Status: DNR  Family Communication:  Status is: changed to inpatient  Dispo:   The patient is from: home Anticipated d/c is to: home Anticipated d/c date is: in 1-2 days Patient currently is not medically stable to d/c due to: need work up for likely GI bleed, and currently receiving IV lasix for CHF exacerbation.   Subjective and Interval History:  Pt reported feeling better today.  Dyspnea improved.  No chest pain.  Ate full meal after EGD.  No fever, abdominal pain, N/V/D.   EGD was normal.    Objective: Vitals:   10/20/19 0954 10/20/19 1011 10/20/19 1019 10/20/19 1109  BP: (!) 123/46 (!) 154/66 (!) 164/60 (!) 188/68  Pulse: 67 70 68 71  Resp: (!) 22 18 18 20   Temp: 97.6 F (36.4 C)   99.5 F (37.5 C)  TempSrc:    Oral  SpO2: 98% 100% 100% 97%  Weight:      Height:        Intake/Output Summary (Last 24 hours) at 10/20/2019 1526 Last data filed at 10/19/2019 2125 Gross per 24 hour  Intake 1270 ml  Output 1700 ml  Net -430 ml   Filed Weights   10/17/19 1825 10/17/19 2029 10/18/19 0619  Weight: 65 kg 64.9 kg 65.1 kg    Examination:   Constitutional: NAD, sleepy, oriented HEENT: conjunctivae and lids normal, EOMI CV: RRR no M,R,G. Distal pulses +2.  No cyanosis.   RESP: CTA B/L, on RA GI: +BS, NTND Extremities: No effusions, edema in BLE SKIN: warm,  dry and intact Neuro: II - XII grossly intact.  Sensation intact   Data Reviewed: I have personally reviewed following labs and imaging studies  CBC: Recent Labs  Lab 10/16/19 1437 10/16/19 1437 10/17/19 1830 10/18/19 0419 10/18/19 1305 10/19/19 0647 10/20/19 1136  WBC 8.3  --  13.9* 11.3*  --  6.7 6.0  NEUTROABS 5.2  --   --   --   --   --   --   HGB 6.7*   < > 6.1* 6.0* 7.4* 7.9* 9.0*  HCT 22.6*   < > 21.3* 20.0* 24.0* 24.4* 29.6*  MCV 71.3*  --  72.0* 70.4*  --  70.9* 73.3*  PLT Not Measured  --  PLATELET CLUMPS NOTED ON SMEAR, UNABLE TO ESTIMATE 183  --  PLATELET CLUMPS NOTED ON SMEAR, UNABLE TO ESTIMATE 148*   < > = values in this interval not displayed.   Basic Metabolic Panel: Recent Labs  Lab 10/16/19 1437 10/17/19 1830 10/19/19 0647 10/20/19 1136  NA 137 127* 137 133*  K 3.7 4.4 3.8 3.3*  CL 100 95* 104 100  CO2 26 23 27 27   GLUCOSE 118* 111* 97 134*  BUN 13 22 15  10  CREATININE 1.10* 1.51* 1.20* 1.27*  CALCIUM 9.3 8.7* 8.9 9.2  MG  --   --  2.4 2.3   GFR: Estimated Creatinine Clearance: 28.7 mL/min (A) (by C-G formula based on SCr of 1.27 mg/dL (H)). Liver Function Tests: Recent Labs  Lab 10/16/19 1437 10/17/19 1830  AST 20 27  ALT 18 20  ALKPHOS 66 53  BILITOT 0.5 0.6  PROT 7.7 6.8  ALBUMIN 4.0 3.7   No results for input(s): LIPASE, AMYLASE in the last 168 hours. No results for input(s): AMMONIA in the last 168 hours. Coagulation Profile: No results for input(s): INR, PROTIME in the last 168 hours. Cardiac Enzymes: No results for input(s): CKTOTAL, CKMB, CKMBINDEX, TROPONINI in the last 168 hours. BNP (last 3 results) No results for input(s): PROBNP in the last 8760 hours. HbA1C: No results for input(s): HGBA1C in the last 72 hours. CBG: No results for input(s): GLUCAP in the last 168 hours. Lipid Profile: No results for input(s): CHOL, HDL, LDLCALC, TRIG, CHOLHDL, LDLDIRECT in the last 72 hours. Thyroid Function Tests: No results for  input(s): TSH, T4TOTAL, FREET4, T3FREE, THYROIDAB in the last 72 hours. Anemia Panel: No results for input(s): VITAMINB12, FOLATE, FERRITIN, TIBC, IRON, RETICCTPCT in the last 72 hours. Sepsis Labs: No results for input(s): PROCALCITON, LATICACIDVEN in the last 168 hours.  Recent Results (from the past 240 hour(s))  SARS Coronavirus 2 by RT PCR (hospital order, performed in [facility] hospital lab) Nasopharyngeal Nasopharyngeal Swab     Status: None   Collection Time: 10/18/19  4:00 AM   Specimen: Nasopharyngeal Swab  Result Value Ref Range Status   SARS Coronavirus 2 NEGATIVE NEGATIVE Final    Comment: (NOTE) SARS-CoV-2 target nucleic acids are NOT DETECTED.  The SARS-CoV-2 RNA is generally detectable in upper and lower respiratory specimens during the acute phase of infection. The lowest concentration of SARS-CoV-2 viral copies this assay can detect is 250 copies / mL. A negative result does not preclude SARS-CoV-2 infection and should not be used as the sole basis for treatment or other patient management decisions.  A negative result may occur with improper specimen collection / handling, submission of specimen other than nasopharyngeal swab, presence of viral mutation(s) within the areas targeted by this assay, and inadequate number of viral copies (<250 copies / mL). A negative result must be combined with clinical observations, patient history, and epidemiological information.  Fact Sheet for Patients:   StrictlyIdeas.no  Fact Sheet for Healthcare Providers: BankingDealers.co.za  This test is not yet approved or  cleared by the [place] FDA and has been authorized for detection and/or diagnosis of SARS-CoV-2 by FDA under an Emergency Use Authorization (EUA).  This EUA will remain in effect (meaning this test can be used) for the duration of the COVID-19 declaration under Section 564(b)(1) of the Act, 21 U.S.C. section  360bbb-3(b)(1), unless the authorization is terminated or revoked sooner.  Performed at [facility], [address], [place], [place] [address]       Radiology Studies: No results found.   Scheduled Meds: . cyanocobalamin  1,000 mcg Subcutaneous Once  . fluticasone furoate-vilanterol  1 puff Inhalation Daily  . gabapentin  300 mg Oral BID  . hydrALAZINE  50 mg Oral Q8H  . magnesium citrate  1 Bottle Oral Once  . metoprolol succinate  200 mg Oral Daily  . pantoprazole (PROTONIX) IV  40 mg Intravenous Q12H  . polyethylene glycol-electrolytes  4,000 mL Oral Once   Continuous Infusions: .  sodium chloride    . iron sucrose       LOS: 1 day     Enzo Bi, MD [facility] If 7PM-7AM, please contact night-coverage 10/20/2019, 3:26 PM

## 2019-10-21 ENCOUNTER — Encounter
Admission: EM | Disposition: A | Discharge status: Home / Self Care | Payer: Self-pay | Source: Home / Self Care | Attending: Hospitalist

## 2019-10-21 ENCOUNTER — Inpatient Hospital Stay: Payer: Medicare HMO | Admitting: Anesthesiology

## 2019-10-21 ENCOUNTER — Encounter: Payer: Self-pay | Admitting: Hospitalist

## 2019-10-21 DIAGNOSIS — D509 Iron deficiency anemia, unspecified: Secondary | ICD-10-CM

## 2019-10-21 DIAGNOSIS — D508 Other iron deficiency anemias: Secondary | ICD-10-CM

## 2019-10-21 DIAGNOSIS — D126 Benign neoplasm of colon, unspecified: Secondary | ICD-10-CM

## 2019-10-21 HISTORY — PX: COLONOSCOPY WITH PROPOFOL: SHX5780

## 2019-10-21 HISTORY — PX: GIVENS CAPSULE STUDY: SHX5432

## 2019-10-21 LAB — MAGNESIUM: Magnesium: 2.4 mg/dL (ref 1.7–2.4)

## 2019-10-21 LAB — BPAM RBC
Blood Product Expiration Date: 202107222359
Blood Product Expiration Date: 202107232359
ISSUE DATE / TIME: 202106240726
ISSUE DATE / TIME: 202106241121
Unit Type and Rh: 5100
Unit Type and Rh: 5100

## 2019-10-21 LAB — BASIC METABOLIC PANEL
Anion gap: 6 (ref 5–15)
BUN: 12 mg/dL (ref 8–23)
CO2: 27 mmol/L (ref 22–32)
Calcium: 9.1 mg/dL (ref 8.9–10.3)
Chloride: 106 mmol/L (ref 98–111)
Creatinine, Ser: 1.17 mg/dL — ABNORMAL HIGH (ref 0.44–1.00)
GFR calc Af Amer: 50 mL/min — ABNORMAL LOW (ref >60)
GFR calc non Af Amer: 43 mL/min — ABNORMAL LOW (ref >60)
Glucose, Bld: 109 mg/dL — ABNORMAL HIGH (ref 70–99)
Potassium: 3.7 mmol/L (ref 3.5–5.1)
Sodium: 139 mmol/L (ref 135–145)

## 2019-10-21 LAB — CBC
HCT: 27 % — ABNORMAL LOW (ref 36.0–46.0)
Hemoglobin: 8.3 g/dL — ABNORMAL LOW (ref 12.0–15.0)
MCH: 22.2 pg — ABNORMAL LOW (ref 26.0–34.0)
MCHC: 30.7 g/dL (ref 30.0–36.0)
MCV: 72.2 fL — ABNORMAL LOW (ref 80.0–100.0)
Platelets: UNDETERMINED 10*3/uL (ref 150–400)
RBC: 3.74 MIL/uL — ABNORMAL LOW (ref 3.87–5.11)
RDW: 18.2 % — ABNORMAL HIGH (ref 11.5–15.5)
WBC: 8.1 10*3/uL (ref 4.0–10.5)
nRBC: 0 % (ref 0.0–0.2)

## 2019-10-21 LAB — TYPE AND SCREEN
ABO/RH(D): O POS
Antibody Screen: NEGATIVE
Unit division: 0
Unit division: 0

## 2019-10-21 LAB — PREPARE RBC (CROSSMATCH)

## 2019-10-21 SURGERY — COLONOSCOPY WITH PROPOFOL
Anesthesia: General

## 2019-10-21 MED ORDER — LIDOCAINE HCL (CARDIAC) PF 100 MG/5ML IV SOSY
PREFILLED_SYRINGE | INTRAVENOUS | Status: DC | PRN
  Administered 2019-10-21: 50 mg via INTRAVENOUS

## 2019-10-21 MED ORDER — PROPOFOL 500 MG/50ML IV EMUL
INTRAVENOUS | Status: DC | PRN
  Administered 2019-10-21: 150 ug/kg/min via INTRAVENOUS

## 2019-10-21 MED ORDER — ONDANSETRON HCL 4 MG/2ML IJ SOLN
INTRAMUSCULAR | Status: DC | PRN
  Administered 2019-10-21: 4 mg via INTRAVENOUS

## 2019-10-21 MED ORDER — LIDOCAINE HCL (PF) 2 % IJ SOLN
INTRAMUSCULAR | Status: AC
  Filled 2019-10-21: qty 5

## 2019-10-21 MED ORDER — PROPOFOL 500 MG/50ML IV EMUL
INTRAVENOUS | Status: AC
  Filled 2019-10-21: qty 50

## 2019-10-21 MED ORDER — PROPOFOL 10 MG/ML IV BOLUS
INTRAVENOUS | Status: DC | PRN
Start: 2019-10-21 — End: 2019-10-21
  Administered 2019-10-21: 40 mg via INTRAVENOUS
  Administered 2019-10-21: 20 mg via INTRAVENOUS
  Administered 2019-10-21: 10 mg via INTRAVENOUS

## 2019-10-21 MED ORDER — SODIUM CHLORIDE 0.9 % IV SOLN
INTRAVENOUS | Status: DC | PRN
Start: 2019-10-21 — End: 2019-10-21

## 2019-10-21 NOTE — Anesthesia Preprocedure Evaluation (Signed)
Anesthesia Evaluation  Patient identified by MRN, date of birth, ID band Patient awake    Reviewed: Allergy & Precautions, NPO status , Patient's Chart, lab work & pertinent test results  History of Anesthesia Complications Negative for: history of anesthetic complications  Airway Mallampati: III  TM Distance: >3 FB Neck ROM: limited    Dental  (+) Upper Dentures, Lower Dentures   Pulmonary neg sleep apnea, COPD, Patient abstained from smoking.Not current smoker, former smoker,    Pulmonary exam normal        Cardiovascular Exercise Tolerance: Good METShypertension, + CAD, + Past MI and +CHF  Normal cardiovascular exam+ dysrhythmias Atrial Fibrillation  - Systolic murmurs Per cardiologist note: Echo at [facility] in 04/2018 showed normal lv function with ef of 55%. Had mild aortic sclerosis with no stenosis. She was evaluated at [facility] er with negative cta of the aorta and ruled out for an mi. Per cardiology, no further preop cardiac workup necessary.   Neuro/Psych PSYCHIATRIC DISORDERS Depression negative neurological ROS     GI/Hepatic GERD  ,(+)     (-) substance abuse  ,   Endo/Other  neg diabetes  Renal/GU Renal InsufficiencyRenal disease     Musculoskeletal  (+) Arthritis ,   Abdominal   Peds  Hematology  (+) Blood dyscrasia, anemia ,   Anesthesia Other Findings Past Medical History: No date: A-fib ([place]) No date: Allergy No date: Anemia No date: CHF (congestive heart failure) (HCC) No date: COPD (chronic obstructive pulmonary disease) (HCC) No date: Depression No date: Feeling grief No date: GERD (gastroesophageal reflux disease) No date: Hyperlipidemia No date: Hypertension 09/06/2016: Hypokalemia     Comment:  Last Assessment & Plan:  Formatting of this note is               different from the original. Will continue to monitor;               ordered labs as below. Lab Results  Component Value Date                K 3.2 (L) 04/12/2016   K 3.5 04/10/2016   K 4.2               04/09/2016   K 4.4 11/21/2013   K 4.4 01/03/2013 No date: Myocardial infarction [facility]) No date: Osteoarthritis 11/10/2015: Post-nasal drip     Comment:  Formatting of this note might be different from the               original. Overview:   (Working Diagnosis)  Last               Assessment & Plan:  Formatting of this note might be               different from the original. Continue OTC flonase 2               sprays daily Likely contributing to cough.  Will see if               flonase helps with cough and PND 06/20/2015: Right wrist pain     Comment:  Last Assessment & Plan:  Formatting of this note might               be different from the original. Significant edema, and               some grip and extension weakness, and persistent severe  pain. She was not able to tolerate splint Reviewed xr of               her recent ED visit No fractures identified (except old               fracture on radial head)  +++ snuff box tenderness               persists [facility]               Information provide No date: Vitamin D deficiency  Reproductive/Obstetrics                             Anesthesia Physical  Anesthesia Plan  ASA: III  Anesthesia Plan: General   Post-op Pain Management:    Induction: Intravenous  PONV Risk Score and Plan: 3 and Ondansetron, Propofol infusion and TIVA  Airway Management Planned: Nasal Cannula and Natural Airway  Additional Equipment: None  Intra-op Plan:   Post-operative Plan:   Informed Consent: I have reviewed the patients History and Physical, chart, labs and discussed the procedure including the risks, benefits and alternatives for the proposed anesthesia with the patient or authorized representative who has indicated his/her understanding and acceptance.   Patient has DNR.  Discussed DNR with patient and  Suspend DNR.   Dental advisory given  Plan Discussed with: CRNA and Surgeon  Anesthesia Plan Comments: (Discussed DNR in detail with patient; patient's wishes are to suspend DNR perioperatively and treat her as needed, and revert back to DNR afterwards.  Patient consented for risks of anesthesia including but not limited to:  - adverse reactions to medications - risk of intubation if required - damage to eyes, teeth, lips or other oral mucosa - nerve damage due to positioning  - sore throat or hoarseness - Damage to heart, brain, nerves, lungs, other parts of body or loss of life  Patient voiced understanding.)        Anesthesia Quick Evaluation

## 2019-10-21 NOTE — Progress Notes (Signed)
Appears to have done ok with the GI procedure.  WIll sign off. Please let me know if I can be of further help with this patient.

## 2019-10-21 NOTE — Anesthesia Postprocedure Evaluation (Signed)
Anesthesia Post Note  Patient: Jessica Howell  Procedure(s) Performed: COLONOSCOPY WITH PROPOFOL (N/A ) GIVENS CAPSULE STUDY (N/A )  Patient location during evaluation: PACU Anesthesia Type: General Level of consciousness: awake and alert Pain management: pain level controlled Vital Signs Assessment: post-procedure vital signs reviewed and stable Respiratory status: spontaneous breathing, nonlabored ventilation, respiratory function stable and patient connected to nasal cannula oxygen Cardiovascular status: blood pressure returned to baseline and stable Postop Assessment: no apparent nausea or vomiting Anesthetic complications: no   No complications documented.   Last Vitals:  Vitals:   10/21/19 1024 10/21/19 1045  BP: (!) 177/49 (!) 176/62  Pulse: 82 86  Resp: 14 20  Temp:  36.6 C  SpO2: 100% 91%    Last Pain:  Vitals:   10/21/19 1045  TempSrc: Oral  PainSc:                  Precious Haws Deajah Erkkila

## 2019-10-21 NOTE — Transfer of Care (Signed)
Immediate Anesthesia Transfer of Care Note  Patient: Jessica Howell  Procedure(s) Performed: COLONOSCOPY WITH PROPOFOL (N/A ) GIVENS CAPSULE STUDY (N/A )  Patient Location: PACU  Anesthesia Type:General  Level of Consciousness: awake and alert   Airway & Oxygen Therapy: Patient Spontanous Breathing and Patient connected to nasal cannula oxygen  Post-op Assessment: Report given to RN and Post -op Vital signs reviewed and stable  Post vital signs: Reviewed and stable  Last Vitals:  Vitals Value Taken Time  BP 147/52 10/21/19 1005  Temp    Pulse 88 10/21/19 1005  Resp 18 10/21/19 1005  SpO2 100 % 10/21/19 1005  Vitals shown include unvalidated device data.  Last Pain:  Vitals:   10/21/19 0845  TempSrc: Oral  PainSc:          Complications: No complications documented.

## 2019-10-21 NOTE — Op Note (Signed)
[facility] Gastroenterology Patient Name: Jessica Howell Procedure Date: 10/21/2019 8:58 AM MRN: [id number] Account #: [id number] Date of Birth: 21-Feb-1935 Admit Type: Inpatient Age: 85 Room: [facility] ENDO ROOM 4 Gender: Female Note Status: Finalized Procedure:             Colonoscopy Indications:           Iron deficiency anemia Providers:             Jonathon Bellows MD, MD Medicines:             Monitored Anesthesia Care Complications:         No immediate complications. Procedure:             Pre-Anesthesia Assessment:                        - Prior to the procedure, a History and Physical was                         performed, and patient medications, allergies and                         sensitivities were reviewed. The patient's tolerance                         of previous anesthesia was reviewed.                        - The risks and benefits of the procedure and the                         sedation options and risks were discussed with the                         patient. All questions were answered and informed                         consent was obtained.                        - ASA Grade Assessment: III - A patient with severe                         systemic disease.                        After obtaining informed consent, the colonoscope was                         passed under direct vision. Throughout the procedure,                         the patient's blood pressure, pulse, and oxygen                         saturations were monitored continuously. The                         Colonoscope was introduced through the anus and  advanced to the the cecum, identified by the                         appendiceal orifice. The colonoscopy was extremely                         difficult due to significant looping and a tortuous                         colon. Successful completion of the procedure was                         aided by  withdrawing the scope and replacing with the                         pediatric colonoscope and applying abdominal pressure.                         The patient tolerated the procedure well. The quality                         of the bowel preparation was adequate to identify                         polyps. Findings:      The perianal and digital rectal examinations were normal.      Two sessile polyps were found in the ascending colon and cecum. The       polyps were 5 to 8 mm in size. These polyps were removed with a cold       snare. Resection and retrieval were complete.      The exam was otherwise without abnormality. Impression:            - Two 5 to 8 mm polyps in the ascending colon and in                         the cecum, removed with a cold snare. Resected and                         retrieved.                        - The examination was otherwise normal. Recommendation:        - Return patient to hospital ward for ongoing care.                        - NPO for 6 hours.                        - Continue present medications.                        - To visualize the small bowel, perform video capsule                         endoscopy today. Procedure Code(s):     --- Professional ---  45385, Colonoscopy, flexible; with removal of                         tumor(s), polyp(s), or other lesion(s) by snare                         technique Diagnosis Code(s):     --- Professional ---                        K63.5, Polyp of colon                        D50.9, Iron deficiency anemia, unspecified CPT copyright 2019 American Medical Association. All rights reserved. The codes documented in this report are preliminary and upon coder review may  be revised to meet current compliance requirements. Jonathon Bellows, MD Jonathon Bellows MD, MD 10/21/2019 9:58:22 AM This report has been signed electronically. Number of Addenda: 0 Note Initiated On: 10/21/2019 8:58 AM Scope  Withdrawal Time: 0 hours 16 minutes 20 seconds  Total Procedure Duration: 0 hours 33 minutes 55 seconds  Estimated Blood Loss:  Estimated blood loss: none.      [facility]

## 2019-10-21 NOTE — Anesthesia Procedure Notes (Signed)
Date/Time: 10/21/2019 9:17 AM Performed by: Johnna Acosta, CRNA Pre-anesthesia Checklist: Emergency Drugs available, Suction available, Patient identified, Patient being monitored and Timeout performed Patient Re-evaluated:Patient Re-evaluated prior to induction Oxygen Delivery Method: Nasal cannula Preoxygenation: Pre-oxygenation with 100% oxygen Induction Type: IV induction

## 2019-10-21 NOTE — H&P (Signed)
Jessica Bellows, MD [address] [place], [place], [place], [address] [address], [place], [place], [place], [address] Phone: [phone]  Fax: [phone]  Primary Care Physician:  Glean Hess, MD   Pre-Procedure History & Physical: HPI:  Jessica Howell is a 85 y.o. female is here for an colonoscopy.   Past Medical History:  Diagnosis Date  . A-fib ([facility])   . Allergy   . Anemia   . CHF (congestive heart failure) ([facility])   . COPD (chronic obstructive pulmonary disease) ([place])   . Depression   . Feeling grief   . GERD (gastroesophageal reflux disease)   . Hyperlipidemia   . Hypertension   . Hypokalemia 09/06/2016   Last Assessment & Plan:  Formatting of this note is different from the original. Will continue to monitor; ordered labs as below. Lab Results  Component Value Date   K 3.2 (L) 04/12/2016   K 3.5 04/10/2016   K 4.2 04/09/2016   K 4.4 11/21/2013   K 4.4 01/03/2013  . Myocardial infarction ([facility])   . Osteoarthritis   . Post-nasal drip 11/10/2015   Formatting of this note might be different from the original. Overview:   (Working Diagnosis)  Last Assessment & Plan:  Formatting of this note might be different from the original. Continue OTC flonase 2 sprays daily Likely contributing to cough.  Will see if flonase helps with cough and PND  . Right wrist pain 06/20/2015   Last Assessment & Plan:  Formatting of this note might be different from the original. Significant edema, and some grip and extension weakness, and persistent severe pain. She was not able to tolerate splint Reviewed xr of her recent ED visit No fractures identified (except old fracture on radial head)  +++ snuff box tenderness persists [facility]erred to [facility] Information provide  . Vitamin D deficiency     Past Surgical History:  Procedure Laterality Date  . ABDOMINAL HYSTERECTOMY    . BREAST SURGERY    . FOREARM SURGERY      Prior to Admission medications   Medication Sig  Start Date End Date Taking? Authorizing Provider  albuterol (VENTOLIN HFA) 108 (90 Base) MCG/ACT inhaler every 6 (six) hours as needed.    Yes [provider]  ALPRAZolam Duanne Moron) 0.5 MG tablet Take 0.5 mg by mouth 3 (three) times daily as needed.    Yes [provider]  Cholecalciferol (VITAMIN D) 125 MCG (5000 UT) CAPS Take 1 capsule by mouth daily.    Yes [provider]  cloNIDine (CATAPRES) 0.2 MG tablet Take 0.2 mg by mouth at bedtime.  09/27/19  Yes [provider]  Fluticasone-Salmeterol (ADVAIR DISKUS) 250-50 MCG/DOSE AEPB Inhale 1 puff into the lungs 2 (two) times daily.  04/19/19 10/18/19 Yes [provider]  furosemide (LASIX) 40 MG tablet Take 1 tablet by mouth in the morning and at bedtime. 04/19/19  Yes [provider]  Melatonin 5 MG CHEW Chew by mouth.    Yes [provider]  Metoprolol Succinate 200 MG CS24 Take 200 mg by mouth daily.   Yes [provider]  montelukast (SINGULAIR) 10 MG tablet Take by mouth at bedtime.  08/07/15  Yes [provider]  nitroGLYCERIN (NITROSTAT) 0.4 MG SL tablet Place 1 tablet (0.4 mg total) under the tongue every 5 (five) minutes as needed for chest pain. 10/09/19  Yes Glean Hess, MD  omeprazole (PRILOSEC) 20 MG capsule TAKE 1 CAPSULE (  20 MG TOTAL) BY MOUTH 2 (TWO) TIMES DAILY BEFORE A MEAL. 08/30/19  Yes Glean Hess, MD  potassium chloride (KLOR-CON) 10 MEQ tablet Take 10 mEq by mouth 2 (two) times daily.  04/27/19  Yes [provider]  ramipril (ALTACE) 10 MG capsule Take 10 mg by mouth 2 (two) times daily.  09/21/13  Yes [provider]  temazepam (RESTORIL) 30 MG capsule Take 30 mg by mouth at bedtime as needed.    Yes [provider]  traMADol (ULTRAM) 50 MG tablet Take by mouth every 6 (six) hours as needed.   Yes [provider]  vitamin E 180 MG (400 UNITS) capsule Take 400 Units by mouth daily.    Yes [provider]     Allergies as of 10/17/2019 - Review Complete 10/17/2019  Allergen Reaction Noted  . Penicillins Anaphylaxis, Rash, and Other (See Comments) 12/18/2012  . Iron Other (See Comments) 10/16/2019  . Azithromycin Other (See Comments) 12/18/2012  . Gemfibrozil Nausea And Vomiting and Rash 06/04/2014  . Solifenacin  06/16/2018  . Statins Nausea And Vomiting and Rash 09/05/2013    Family History  Problem Relation Age of Onset  . Diabetes Mother   . Hypertension Mother   . Cancer Father        esoph.   Marland Kitchen COPD Sister   . Heart disease Brother   . Early death Son     Social History   Socioeconomic History  . Marital status: Widowed    Spouse name: Not on file  . Number of children: 1  . Years of education: Not on file  . Highest education level: Not on file  Occupational History  . Occupation: retired  Tobacco Use  . Smoking status: Former Smoker    Packs/day: 0.50    Years: 45.00    Pack years: 22.50    Types: Cigarettes    Quit date: 06/10/2004    Years since quitting: 15.3  . Smokeless tobacco: Never Used  Vaping Use  . Vaping Use: Never used  Substance and Sexual Activity  . Alcohol use: Never  . Drug use: Not Currently  . Sexual activity: Not Currently  Other Topics Concern  . Not on file  Social History Narrative   Living alone. Husband passed away in 12/14/18. Daughter checking in on her and great grandson checks in also.    Social Determinants of Health   Financial Resource Strain: Low Risk   . Difficulty of Paying Living Expenses: Not very hard  Food Insecurity: No Food Insecurity  . Worried About Charity fundraiser in the Last Year: Never true  . Ran Out of Food in the Last Year: Never true  Transportation Needs: No Transportation Needs  . Lack of Transportation (Medical): No  . Lack of Transportation (Non-Medical): No  Physical Activity: Inactive  . Days of Exercise per Week: 0 days  . Minutes of Exercise per Session: 0 min  Stress: Stress Concern  Present  . Feeling of Stress : Very much  Social Connections: Unknown  . Frequency of Communication with Friends and Family: Patient refused  . Frequency of Social Gatherings with Friends and Family: Patient refused  . Attends Religious Services: Patient refused  . Active Member of Clubs or Organizations: Patient refused  . Attends Archivist Meetings: Patient refused  . Marital Status: Widowed  Intimate Partner Violence: Not At Risk  . Fear of Current or Ex-Partner: No  . Emotionally Abused: No  .  Physically Abused: No  . Sexually Abused: No    Review of Systems: See HPI, otherwise negative ROS  Physical Exam: BP (!) 185/71 (BP Location: Left Arm)   Pulse 81   Temp 98.6 F (37 C) (Oral)   Resp 17   Ht 5' (1.524 m)   Wt 65.1 kg   SpO2 94%   BMI 28.04 kg/m  General:   Alert,  pleasant and cooperative in NAD Head:  Normocephalic and atraumatic. Neck:  Supple; no masses or thyromegaly. Lungs:  Clear throughout to auscultation, normal respiratory effort.    Heart:  +S1, +S2, Regular rate and rhythm, No edema. Abdomen:  Soft, nontender and nondistended. Normal bowel sounds, without guarding, and without rebound.   Neurologic:  Alert and  oriented x4;  grossly normal neurologically.  Impression/Plan: Grey Schlauch is here for an colonoscopy to be performed for evaluation of iron deficiency anemia.   Risks, benefits, limitations, and alternatives regarding  colonoscopy have been reviewed with the patient.  Questions have been answered.  All parties agreeable.   Jessica Bellows, MD  10/21/2019, 9:06 AM

## 2019-10-21 NOTE — Progress Notes (Signed)
PROGRESS NOTE    Jessica Howell  GGE:[id number] DOB: 13-Aug-1934 DOA: 10/18/2019 PCP: Jessica Hess, MD    Assessment & Plan:   Principal Problem:   Symptomatic anemia Active Problems:   Paroxysmal atrial fibrillation (HCC)   Essential (primary) hypertension   Esophageal dysphagia   Coronary atherosclerosis of native coronary artery   Chronic obstructive pulmonary disease, unspecified (HCC)   Elevated troponin   Chronic anticoagulation, recent, Xarelto    Chronic diastolic CHF (congestive heart failure) (HCC)   Hypertensive urgency   Hyponatremia   Pulmonary nodule 1 cm or greater in diameter   Thyroid nodule greater than or equal to 1.5 cm in diameter incidentally noted on imaging study   CKD (chronic kidney disease) stage 3, GFR 30-59 ml/min   History of GI bleed   Upper GI bleed    Jessica Howell is a 85 y.o. Caucasian female with medical history significant for CAD, COPD, diastolic CHF, HTN, depression, paroxysmal atrial fibrillation, recently on Xarelto, discontinued on 10/13/2019 by Dr. Nehemiah Howell secondary to anemia, and with history of need for IV iron in the past for anemia from suspected chronic GI losses, who presents to the emergency room with a several month history of episodic retrosternal chest pain, dyspnea on exertion for which she was recently undergoing extensive evaluation.   Dyspnea and chest pain 2/2 Symptomatic anemia History of chronic GI blood loss -Patient presented with chest pain and shortness of breath with finding of hemoglobin of 6.1, down from 14.6 about 4 months prior.  Pt had increased RR. --Pt was recently on Xarelto, discontinued on 10/13/19 due to dropping Hgb.   -Had pain episode after IV iron on 10/16/2019, though has received IV iron in the past for suspected chronic GI blood loss, per review of [facility] -Anemia work-up revealing iron of 11, ferritin 10 and increased TIBC --1u pRBC on 6/24 with pain episode afterwards.  IV venofer  given on 6/26, no issues. --EGD normal finding. PLAN: --colonoscopy today, just polys. -Capsule study today, per GI  Acute on chronic diastolic CHF exacerbation --Pt presented with dyspnea, elevated respiratory rate, elevated BNP to 868, hypernatremia and AKI, likely due to volume overload and congestion.  Respiratory rate, Na, Cr all improved after IV lasix yesterday, further supporting that pt was fluid overloaded. --Last complete Echo in Dec 2019 showed grade II diastolic dysfunction. PLAN: --Hold diuretic today since just had colon prep and NPO today --Strict I/O  Chest pain, ACS ruled out Elevated troponin 2/2 demand ischemia Coronary atherosclerosis of native coronary artery -Patient has troponin elevation 38>>47>>63>>67>>60>>48, in association with reports of chest pain -Chest pain suspect epigastric pain from probable gastritis PLAN: -Continue beta-blockers, statins  AKI, improved --BP elevated, labs did not suggest dehydration.  BNP elevated, so likely due to congestion, especially since Cr improved after IV lasix. PLAN: --Hold diuretic today since just had colon prep and NPO today --Strict I/O --Hold home ramipril     Hypertensive urgency  -BP 208/133 on presentation, and remained elevated.   PLAN: --continue home metop --Hold home ramipril 2/2 AKI --continue PO hydralazine 50 mg q8 (new) -IV Hydralazine as needed systolic blood pressure over 180    Hyponatremia due to hypervolemia -Sodium 127, suspect hypervolemic from heart failure exacerbation.  Improved to 137 with IV lasix.    Paroxysmal atrial fibrillation (HCC) -Currently in sinus rhythm -Xarelto was discontinued on 10/13/2019 due to symptomatic anemia -Continue home metop    Esophageal dysphagia -No acute concerns.  Chronic obstructive pulmonary disease, unspecified (HCC) -Not acutely exacerbated -continue home daily bronchodilators    Pulmonary nodule 1 cm or greater in  diameter -Incidental finding on CT from 10/16/2019 -Recommendations per radiology for outpatient follow-up    Thyroid nodule greater than or equal to 1.5 cm in diameter incidentally noted on imaging study -Thyroid ultrasound can be arranged as outpatient  Anxiety and depression --followed by outpatient psych.  Pt's husband recently passed away.  Pt lives alone and feels anxious a lot. PLAN: --continue home Xanax PRN and Restoril PRN  Chronic pain   Likely neuropathy --Pt takes Tramadol PRN for whole-body aches.  Also complained of pain in her feet, which could be neuropathy. PLAN: --continue home tramadol PRN --continue gabapentin 300 mg BID (new)   DVT prophylaxis: SCD/Compression stockings Code Status: DNR  Family Communication:  Status is: changed to inpatient  Dispo:   The patient is from: home Anticipated d/c is to: home Anticipated d/c date is: in 1-2 days Patient currently is not medically stable to d/c due to: still being worked up for likely GI bleed   Subjective and Interval History:  No more chest pain, no dyspnea.  No fever, abdominal pain, N/V.  Just felt hungry.  Colonoscopy just found polyps.  GI rec capsule study today.    Objective: Vitals:   10/21/19 1006 10/21/19 1014 10/21/19 1024 10/21/19 1045  BP: (!) 147/52 (!) 170/48 (!) 177/49 (!) 176/62  Pulse: 85 84 82 86  Resp: (!) 27 (!) 22 14 20   Temp:    97.9 F (36.6 C)  TempSrc:    Oral  SpO2: 100% 100% 100% 91%  Weight:      Height:        Intake/Output Summary (Last 24 hours) at 10/21/2019 1342 Last data filed at 10/21/2019 0954 Gross per 24 hour  Intake 525.23 ml  Output 0 ml  Net 525.23 ml   Filed Weights   10/17/19 1825 10/17/19 2029 10/18/19 0619  Weight: 65 kg 64.9 kg 65.1 kg    Examination:   Constitutional: NAD, oriented HEENT: conjunctivae and lids normal, EOMI CV: RRR no M,R,G. Distal pulses +2.  No cyanosis.   RESP: CTA B/L, on RA GI: +BS, NTND Extremities: No  effusions, edema in BLE SKIN: warm, dry and intact Neuro: II - XII grossly intact.  Sensation intact   Data Reviewed: I have personally reviewed following labs and imaging studies  CBC: Recent Labs  Lab 10/16/19 1437 10/16/19 1437 10/17/19 1830 10/17/19 1830 10/18/19 0419 10/18/19 1305 10/19/19 0647 10/20/19 1136 10/21/19 0718  WBC 8.3   < > 13.9*  --  11.3*  --  6.7 6.0 8.1  NEUTROABS 5.2  --   --   --   --   --   --   --   --   HGB 6.7*   < > 6.1*   < > 6.0* 7.4* 7.9* 9.0* 8.3*  HCT 22.6*   < > 21.3*   < > 20.0* 24.0* 24.4* 29.6* 27.0*  MCV 71.3*   < > 72.0*  --  70.4*  --  70.9* 73.3* 72.2*  PLT Not Measured   < > PLATELET CLUMPS NOTED ON SMEAR, UNABLE TO ESTIMATE  --  183  --  PLATELET CLUMPS NOTED ON SMEAR, UNABLE TO ESTIMATE 148* PLATELET CLUMPS NOTED ON SMEAR, UNABLE TO ESITMATE   < > = values in this interval not displayed.   Basic Metabolic Panel: Recent Labs  Lab 10/16/19 1437 10/17/19  1830 10/19/19 0647 10/20/19 1136 10/21/19 0540  NA 137 127* 137 133* 139  K 3.7 4.4 3.8 3.3* 3.7  CL 100 95* 104 100 106  CO2 26 23 27 27 27   GLUCOSE 118* 111* 97 134* 109*  BUN 13 22 15 10 12   CREATININE 1.10* 1.51* 1.20* 1.27* 1.17*  CALCIUM 9.3 8.7* 8.9 9.2 9.1  MG  --   --  2.4 2.3 2.4   GFR: Estimated Creatinine Clearance: 31.2 mL/min (A) (by C-G formula based on SCr of 1.17 mg/dL (H)). Liver Function Tests: Recent Labs  Lab 10/16/19 1437 10/17/19 1830  AST 20 27  ALT 18 20  ALKPHOS 66 53  BILITOT 0.5 0.6  PROT 7.7 6.8  ALBUMIN 4.0 3.7   No results for input(s): LIPASE, AMYLASE in the last 168 hours. No results for input(s): AMMONIA in the last 168 hours. Coagulation Profile: No results for input(s): INR, PROTIME in the last 168 hours. Cardiac Enzymes: No results for input(s): CKTOTAL, CKMB, CKMBINDEX, TROPONINI in the last 168 hours. BNP (last 3 results) No results for input(s): PROBNP in the last 8760 hours. HbA1C: No results for input(s): HGBA1C in  the last 72 hours. CBG: No results for input(s): GLUCAP in the last 168 hours. Lipid Profile: No results for input(s): CHOL, HDL, LDLCALC, TRIG, CHOLHDL, LDLDIRECT in the last 72 hours. Thyroid Function Tests: No results for input(s): TSH, T4TOTAL, FREET4, T3FREE, THYROIDAB in the last 72 hours. Anemia Panel: No results for input(s): VITAMINB12, FOLATE, FERRITIN, TIBC, IRON, RETICCTPCT in the last 72 hours. Sepsis Labs: No results for input(s): PROCALCITON, LATICACIDVEN in the last 168 hours.  Recent Results (from the past 240 hour(s))  SARS Coronavirus 2 by RT PCR (hospital order, performed in [facility] hospital lab) Nasopharyngeal Nasopharyngeal Swab     Status: None   Collection Time: 10/18/19  4:00 AM   Specimen: Nasopharyngeal Swab  Result Value Ref Range Status   SARS Coronavirus 2 NEGATIVE NEGATIVE Final    Comment: (NOTE) SARS-CoV-2 target nucleic acids are NOT DETECTED.  The SARS-CoV-2 RNA is generally detectable in upper and lower respiratory specimens during the acute phase of infection. The lowest concentration of SARS-CoV-2 viral copies this assay can detect is 250 copies / mL. A negative result does not preclude SARS-CoV-2 infection and should not be used as the sole basis for treatment or other patient management decisions.  A negative result may occur with improper specimen collection / handling, submission of specimen other than nasopharyngeal swab, presence of viral mutation(s) within the areas targeted by this assay, and inadequate number of viral copies (<250 copies / mL). A negative result must be combined with clinical observations, patient history, and epidemiological information.  Fact Sheet for Patients:   StrictlyIdeas.no  Fact Sheet for Healthcare Providers: BankingDealers.co.za  This test is not yet approved or  cleared by the [place] FDA and has been authorized for detection and/or diagnosis of  SARS-CoV-2 by FDA under an Emergency Use Authorization (EUA).  This EUA will remain in effect (meaning this test can be used) for the duration of the COVID-19 declaration under Section 564(b)(1) of the Act, 21 U.S.C. section 360bbb-3(b)(1), unless the authorization is terminated or revoked sooner.  Performed at [facility], [address] [place], [place] [address]       Radiology Studies: No results found.   Scheduled Meds:  fluticasone furoate-vilanterol  1 puff Inhalation Daily   gabapentin  300 mg Oral BID   hydrALAZINE  50  mg Oral Q8H   metoprolol succinate  200 mg Oral Daily   Continuous Infusions:  sodium chloride Stopped (10/20/19 1615)     LOS: 2 days     Enzo Bi, MD [facility] If 7PM-7AM, please contact night-coverage 10/21/2019, 1:42 PM

## 2019-10-22 ENCOUNTER — Encounter: Payer: Self-pay | Admitting: Gastroenterology

## 2019-10-22 LAB — BASIC METABOLIC PANEL
Anion gap: 4 — ABNORMAL LOW (ref 5–15)
BUN: 6 mg/dL — ABNORMAL LOW (ref 8–23)
CO2: 27 mmol/L (ref 22–32)
Calcium: 9.2 mg/dL (ref 8.9–10.3)
Chloride: 106 mmol/L (ref 98–111)
Creatinine, Ser: 1.02 mg/dL — ABNORMAL HIGH (ref 0.44–1.00)
GFR calc Af Amer: 59 mL/min — ABNORMAL LOW (ref >60)
GFR calc non Af Amer: 51 mL/min — ABNORMAL LOW (ref >60)
Glucose, Bld: 101 mg/dL — ABNORMAL HIGH (ref 70–99)
Potassium: 3.5 mmol/L (ref 3.5–5.1)
Sodium: 137 mmol/L (ref 135–145)

## 2019-10-22 LAB — CBC
HCT: 27.5 % — ABNORMAL LOW (ref 36.0–46.0)
Hemoglobin: 8.2 g/dL — ABNORMAL LOW (ref 12.0–15.0)
MCH: 22 pg — ABNORMAL LOW (ref 26.0–34.0)
MCHC: 29.8 g/dL — ABNORMAL LOW (ref 30.0–36.0)
MCV: 73.7 fL — ABNORMAL LOW (ref 80.0–100.0)
Platelets: 186 10*3/uL (ref 150–400)
RBC: 3.73 MIL/uL — ABNORMAL LOW (ref 3.87–5.11)
RDW: 18.6 % — ABNORMAL HIGH (ref 11.5–15.5)
WBC: 8.9 10*3/uL (ref 4.0–10.5)
nRBC: 0 % (ref 0.0–0.2)

## 2019-10-22 LAB — MAGNESIUM: Magnesium: 2.3 mg/dL (ref 1.7–2.4)

## 2019-10-22 MED ORDER — FUROSEMIDE 40 MG PO TABS
40.0000 mg | ORAL_TABLET | Freq: Two times a day (BID) | ORAL | Status: DC
  Administered 2019-10-22 – 2019-10-23 (×2): 40 mg via ORAL
  Filled 2019-10-22 (×2): qty 1

## 2019-10-22 MED ORDER — SODIUM CHLORIDE 0.9 % IV SOLN
300.0000 mg | Freq: Once | INTRAVENOUS | Status: AC
  Administered 2019-10-22: 300 mg via INTRAVENOUS
  Filled 2019-10-22: qty 15

## 2019-10-22 NOTE — Care Management Important Message (Signed)
Important Message  Patient Details  Name: Jessica Howell MRN: [id number] Date of Birth: 01/28/1935   Medicare Important Message Given:  Yes     Jessica Howell 10/22/2019, 11:07 AM

## 2019-10-22 NOTE — Progress Notes (Signed)
Vonda Antigua, MD [address], [place], [place], [place], [address] [address], [place], [place], [place], [address] Phone: [phone]  Fax: [phone]   Subjective: Patient denies any active bleeding.  No abdominal pain.   Objective: Exam: Vital signs in last 24 hours: Vitals:   10/22/19 0235 10/22/19 0515 10/22/19 0739 10/22/19 1159  BP:  (!) 137/53 (!) 134/51 (!) 109/43  Pulse:  78 86 80  Resp: 18 16 16 18   Temp:  99.7 F (37.6 C) 98.7 F (37.1 C) 99.3 F (37.4 C)  TempSrc:  Oral Oral Oral  SpO2:  95% 94% 96%  Weight:      Height:       Weight change:   Intake/Output Summary (Last 24 hours) at 10/22/2019 1519 Last data filed at 10/22/2019 1026 Gross per 24 hour  Intake 240 ml  Output --  Net 240 ml    General: No acute distress, AAO x3 Abd: Soft, NT/ND, No HSM Skin: Warm, no rashes Neck: Supple, Trachea midline   Lab Results: Lab Results  Component Value Date   WBC 8.9 10/22/2019   HGB 8.2 (L) 10/22/2019   HCT 27.5 (L) 10/22/2019   MCV 73.7 (L) 10/22/2019   PLT 186 10/22/2019   Micro Results: Recent Results (from the past 240 hour(s))  SARS Coronavirus 2 by RT PCR (hospital order, performed in [facility] hospital lab) Nasopharyngeal Nasopharyngeal Swab     Status: None   Collection Time: 10/18/19  4:00 AM   Specimen: Nasopharyngeal Swab  Result Value Ref Range Status   SARS Coronavirus 2 NEGATIVE NEGATIVE Final    Comment: (NOTE) SARS-CoV-2 target nucleic acids are NOT DETECTED.  The SARS-CoV-2 RNA is generally detectable in upper and lower respiratory specimens during the acute phase of infection. The lowest concentration of SARS-CoV-2 viral copies this assay can detect is 250 copies / mL. A negative result does not preclude SARS-CoV-2 infection and should not be used as the sole basis for treatment or other patient management decisions.  A negative result may occur with improper specimen collection / handling, submission of  specimen other than nasopharyngeal swab, presence of viral mutation(s) within the areas targeted by this assay, and inadequate number of viral copies (<250 copies / mL). A negative result must be combined with clinical observations, patient history, and epidemiological information.  Fact Sheet for Patients:   StrictlyIdeas.no  Fact Sheet for Healthcare Providers: BankingDealers.co.za  This test is not yet approved or  cleared by the [place] FDA and has been authorized for detection and/or diagnosis of SARS-CoV-2 by FDA under an Emergency Use Authorization (EUA).  This EUA will remain in effect (meaning this test can be used) for the duration of the COVID-19 declaration under Section 564(b)(1) of the Act, 21 U.S.C. section 360bbb-3(b)(1), unless the authorization is terminated or revoked sooner.  Performed at [facility], [address] [place], [place] [address]    Studies/Results: No results found. Medications:  Scheduled Meds: . fluticasone furoate-vilanterol  1 puff Inhalation Daily  . gabapentin  300 mg Oral BID  . hydrALAZINE  50 mg Oral Q8H  . metoprolol succinate  200 mg Oral Daily   Continuous Infusions: . sodium chloride Stopped (10/20/19 1615)   PRN Meds:.acetaminophen **OR** acetaminophen, ALPRAZolam, cyclobenzaprine, diphenhydrAMINE, fluticasone, melatonin, ondansetron **OR** ondansetron (ZOFRAN) IV, temazepam, traMADol   Assessment: Principal Problem:   Symptomatic anemia Active Problems:   Paroxysmal atrial fibrillation (HCC)   Essential (primary) hypertension   Esophageal dysphagia  Coronary atherosclerosis of native coronary artery   Chronic obstructive pulmonary disease, unspecified (HCC)   Elevated troponin   Chronic anticoagulation, recent, Xarelto    Chronic diastolic CHF (congestive heart failure) (HCC)   Hypertensive urgency   Hyponatremia   Pulmonary nodule 1 cm or greater in  diameter   Thyroid nodule greater than or equal to 1.5 cm in diameter incidentally noted on imaging study   CKD (chronic kidney disease) stage 3, GFR 30-59 ml/min   History of GI bleed   Upper GI bleed    Plan: Small bowel capsule study read and sent for scanning  No actively bleeding lesions, or any etiology of iron deficiency anemia seen on the study  See official report once it is scanned in  Monitor CBC  Replace iron as per hematology   LOS: 3 days   Vonda Antigua, MD 10/22/2019, 3:19 PM

## 2019-10-22 NOTE — Progress Notes (Signed)
PROGRESS NOTE    Jessica Howell  VQQ:[id number] DOB: March 08, 1935 DOA: 10/18/2019 PCP: Glean Hess, MD    Assessment & Plan:   Principal Problem:   Symptomatic anemia Active Problems:   Paroxysmal atrial fibrillation (HCC)   Essential (primary) hypertension   Esophageal dysphagia   Coronary atherosclerosis of native coronary artery   Chronic obstructive pulmonary disease, unspecified (HCC)   Elevated troponin   Chronic anticoagulation, recent, Xarelto    Chronic diastolic CHF (congestive heart failure) (HCC)   Hypertensive urgency   Hyponatremia   Pulmonary nodule 1 cm or greater in diameter   Thyroid nodule greater than or equal to 1.5 cm in diameter incidentally noted on imaging study   CKD (chronic kidney disease) stage 3, GFR 30-59 ml/min   History of GI bleed   Upper GI bleed    Derrica Sieg is a 85 y.o. Caucasian female with medical history significant for CAD, COPD, diastolic CHF, HTN, depression, paroxysmal atrial fibrillation, recently on Xarelto, discontinued on 10/13/2019 by Dr. Nehemiah Massed secondary to anemia, and with history of need for IV iron in the past for anemia from suspected chronic GI losses, who presents to the emergency room with a several month history of episodic retrosternal chest pain, dyspnea on exertion for which she was recently undergoing extensive evaluation.   Dyspnea and chest pain 2/2 Symptomatic anemia History of chronic GI blood loss -Patient presented with chest pain and shortness of breath with finding of hemoglobin of 6.1, down from 14.6 about 4 months prior.  Pt had increased RR. --Pt was recently on Xarelto, discontinued on 10/13/19 due to dropping Hgb.   -Had pain episode after IV iron on 10/16/2019, though has received IV iron in the past for suspected chronic GI blood loss, per review of records from [facility] -Anemia work-up revealing iron of 11, ferritin 10 and increased TIBC --1u pRBC on 6/24 with pain episode afterwards.  IV venofer  given on 6/26, no issues. --EGD normal finding.  colonoscopy, just polys. PLAN: -Capsule study pending --repeat IV venofer  Acute on chronic diastolic CHF exacerbation --Pt presented with dyspnea, elevated respiratory rate, elevated BNP to 868, hypernatremia and AKI, likely due to volume overload and congestion.  Respiratory rate, Na, Cr all improved after IV lasix yesterday, further supporting that pt was fluid overloaded. --Last complete Echo in Dec 2019 showed grade II diastolic dysfunction. PLAN: --resume home Lasix 40 mg BID --Strict I/O  Chest pain, ACS ruled out Elevated troponin 2/2 demand ischemia Coronary atherosclerosis of native coronary artery -Patient has troponin elevation 38>>47>>63>>67>>60>>48, in association with reports of chest pain -Chest pain suspect epigastric pain from probable gastritis PLAN: -Continue beta-blockers, statins  AKI, improved --Cr 1.51 on presentation.  BP elevated, labs did not suggest dehydration.  BNP elevated, so likely due to congestion, especially since Cr improved after IV lasix. PLAN: --resume home Lasix 40 mg BID --Strict I/O --Hold home ramipril     Hypertensive urgency  -BP 208/133 on presentation, and remained elevated, until today when it went down to 100's PLAN: --continue home metop --Hold home ramipril 2/2 AKI --d/c PO hydralazine 50 mg q8 (new) -IV Hydralazine as needed systolic blood pressure over 180    Hyponatremia due to hypervolemia -Sodium 127, suspect hypervolemic from heart failure exacerbation.  Improved to 137 with IV lasix.    Paroxysmal atrial fibrillation (HCC) -Currently in sinus rhythm -Xarelto was discontinued on 10/13/2019 due to symptomatic anemia -Continue home metop    Esophageal dysphagia -No acute concerns.  Chronic obstructive pulmonary disease, unspecified (HCC) -Not acutely exacerbated -continue home daily bronchodilators    Pulmonary nodule 1 cm or greater in  diameter -Incidental finding on CT from 10/16/2019 -Recommendations per radiology for outpatient follow-up    Thyroid nodule greater than or equal to 1.5 cm in diameter incidentally noted on imaging study -Thyroid ultrasound can be arranged as outpatient  Anxiety and depression --followed by outpatient psych.  Pt's husband recently passed away.  Pt lives alone and feels anxious a lot. PLAN: --continue home Xanax PRN and Restoril PRN  Chronic pain   Likely neuropathy --Pt takes Tramadol PRN for whole-body aches.  Also complained of pain in her feet, which could be neuropathy. PLAN: --continue home tramadol PRN --continue gabapentin 300 mg BID (new)   DVT prophylaxis: SCD/Compression stockings Code Status: DNR  Family Communication:  Status is: changed to inpatient  Dispo:   The patient is from: home Anticipated d/c is to: home Anticipated d/c date is: tomorrow Patient currently is not medically stable to d/c due to: still being worked up for likely GI bleed   Subjective and Interval History:  No more chest pain, no dyspnea.  No fever, abdominal pain, N/V.  Just felt hungry.  Colonoscopy just found polyps.  GI rec capsule study today.    Objective: Vitals:   10/22/19 0235 10/22/19 0515 10/22/19 0739 10/22/19 1159  BP:  (!) 137/53 (!) 134/51 (!) 109/43  Pulse:  78 86 80  Resp: 18 16 16 18   Temp:  99.7 F (37.6 C) 98.7 F (37.1 C) 99.3 F (37.4 C)  TempSrc:  Oral Oral Oral  SpO2:  95% 94% 96%  Weight:      Height:        Intake/Output Summary (Last 24 hours) at 10/22/2019 1657 Last data filed at 10/22/2019 1026 Gross per 24 hour  Intake 240 ml  Output --  Net 240 ml   Filed Weights   10/17/19 1825 10/17/19 2029 10/18/19 0619  Weight: 65 kg 64.9 kg 65.1 kg    Examination:   Constitutional: NAD, oriented HEENT: conjunctivae and lids normal, EOMI CV: RRR no M,R,G. Distal pulses +2.  No cyanosis.   RESP: CTA B/L, on RA GI: +BS, NTND Extremities: No  effusions, edema in BLE SKIN: warm, dry and intact Neuro: II - XII grossly intact.  Sensation intact   Data Reviewed: I have personally reviewed following labs and imaging studies  CBC: Recent Labs  Lab 10/16/19 1437 10/17/19 1830 10/18/19 0419 10/18/19 0419 10/18/19 1305 10/19/19 0647 10/20/19 1136 10/21/19 0718 10/22/19 0356  WBC 8.3   < > 11.3*  --   --  6.7 6.0 8.1 8.9  NEUTROABS 5.2  --   --   --   --   --   --   --   --   HGB 6.7*   < > 6.0*   < > 7.4* 7.9* 9.0* 8.3* 8.2*  HCT 22.6*   < > 20.0*   < > 24.0* 24.4* 29.6* 27.0* 27.5*  MCV 71.3*   < > 70.4*  --   --  70.9* 73.3* 72.2* 73.7*  PLT Not Measured   < > 183  --   --  PLATELET CLUMPS NOTED ON SMEAR, UNABLE TO ESTIMATE 148* PLATELET CLUMPS NOTED ON SMEAR, UNABLE TO ESITMATE 186   < > = values in this interval not displayed.   Basic Metabolic Panel: Recent Labs  Lab 10/17/19 1830 10/19/19 8786 10/20/19 1136 10/21/19 0540 10/22/19 0356  NA 127* 137 133* 139 137  K 4.4 3.8 3.3* 3.7 3.5  CL 95* 104 100 106 106  CO2 23 27 27 27 27   GLUCOSE 111* 97 134* 109* 101*  BUN 22 15 10 12  6*  CREATININE 1.51* 1.20* 1.27* 1.17* 1.02*  CALCIUM 8.7* 8.9 9.2 9.1 9.2  MG  --  2.4 2.3 2.4 2.3   GFR: Estimated Creatinine Clearance: 35.8 mL/min (A) (by C-G formula based on SCr of 1.02 mg/dL (H)). Liver Function Tests: Recent Labs  Lab 10/16/19 1437 10/17/19 1830  AST 20 27  ALT 18 20  ALKPHOS 66 53  BILITOT 0.5 0.6  PROT 7.7 6.8  ALBUMIN 4.0 3.7   No results for input(s): LIPASE, AMYLASE in the last 168 hours. No results for input(s): AMMONIA in the last 168 hours. Coagulation Profile: No results for input(s): INR, PROTIME in the last 168 hours. Cardiac Enzymes: No results for input(s): CKTOTAL, CKMB, CKMBINDEX, TROPONINI in the last 168 hours. BNP (last 3 results) No results for input(s): PROBNP in the last 8760 hours. HbA1C: No results for input(s): HGBA1C in the last 72 hours. CBG: No results for input(s):  GLUCAP in the last 168 hours. Lipid Profile: No results for input(s): CHOL, HDL, LDLCALC, TRIG, CHOLHDL, LDLDIRECT in the last 72 hours. Thyroid Function Tests: No results for input(s): TSH, T4TOTAL, FREET4, T3FREE, THYROIDAB in the last 72 hours. Anemia Panel: No results for input(s): VITAMINB12, FOLATE, FERRITIN, TIBC, IRON, RETICCTPCT in the last 72 hours. Sepsis Labs: No results for input(s): PROCALCITON, LATICACIDVEN in the last 168 hours.  Recent Results (from the past 240 hour(s))  SARS Coronavirus 2 by RT PCR (hospital order, performed in [facility] hospital lab) Nasopharyngeal Nasopharyngeal Swab     Status: None   Collection Time: 10/18/19  4:00 AM   Specimen: Nasopharyngeal Swab  Result Value Ref Range Status   SARS Coronavirus 2 NEGATIVE NEGATIVE Final    Comment: (NOTE) SARS-CoV-2 target nucleic acids are NOT DETECTED.  The SARS-CoV-2 RNA is generally detectable in upper and lower respiratory specimens during the acute phase of infection. The lowest concentration of SARS-CoV-2 viral copies this assay can detect is 250 copies / mL. A negative result does not preclude SARS-CoV-2 infection and should not be used as the sole basis for treatment or other patient management decisions.  A negative result may occur with improper specimen collection / handling, submission of specimen other than nasopharyngeal swab, presence of viral mutation(s) within the areas targeted by this assay, and inadequate number of viral copies (<250 copies / mL). A negative result must be combined with clinical observations, patient history, and epidemiological information.  Fact Sheet for Patients:   StrictlyIdeas.no  Fact Sheet for Healthcare Providers: BankingDealers.co.za  This test is not yet approved or  cleared by the [place] FDA and has been authorized for detection and/or diagnosis of SARS-CoV-2 by FDA under an Emergency Use  Authorization (EUA).  This EUA will remain in effect (meaning this test can be used) for the duration of the COVID-19 declaration under Section 564(b)(1) of the Act, 21 U.S.C. section 360bbb-3(b)(1), unless the authorization is terminated or revoked sooner.  Performed at [facility], [address], [place], [place] [address]       Radiology Studies: No results found.   Scheduled Meds:  fluticasone furoate-vilanterol  1 puff Inhalation Daily   furosemide  40 mg Oral BID   gabapentin  300 mg Oral BID   hydrALAZINE  50 mg Oral  Q8H   metoprolol succinate  200 mg Oral Daily   Continuous Infusions:  sodium chloride Stopped (10/20/19 1615)   iron sucrose       LOS: 3 days     Enzo Bi, MD [facility] If 7PM-7AM, please contact night-coverage 10/22/2019, 4:57 PM

## 2019-10-22 NOTE — TOC Initial Note (Signed)
Transition of Care [facility]) - Initial/Assessment Note    Patient Details  Name: Jessica Howell MRN: [id number] Date of Birth: 1935-04-05  [facility] CM/SW Contact:    Shelbie Hutching, RN Phone Number: 10/22/2019, 10:42 AM  Clinical Narrative:                 Patient admitted for symptomatic anemia, colonoscopy completed yesterday.  Patient reports that she is from home and lives alone.  Patient reports that she is independent in ADL's and requires no assistive devices.  Patient declines home health services, saying she doesn't think she needs that.  Patient has a daughter that lives in the area and daughter will come and pick her up at discharge.  Expected Discharge Plan: Home/Self Care Barriers to Discharge: Continued Medical Work up   Patient Goals and CMS Choice Patient states their goals for this hospitalization and ongoing recovery are:: Patient wants to go back home      Expected Discharge Plan and Services Expected Discharge Plan: Home/Self Care   Discharge Planning Services: CM Consult   Living arrangements for the past 2 months: Single Family Home                                      Prior Living Arrangements/Services Living arrangements for the past 2 months: Single Family Home Lives with:: Self Patient language and need for interpreter reviewed:: Yes Do you feel safe going back to the place where you live?: Yes      Need for Family Participation in Patient Care: Yes (Comment) Care giver support system in place?: Yes (comment) (daughter)   Criminal Activity/Legal Involvement Pertinent to Current Situation/Hospitalization: No - Comment as needed  Activities of Daily Living Home Assistive Devices/Equipment: None ADL Screening (condition at time of admission) Patient's cognitive ability adequate to safely complete daily activities?: Yes Is the patient deaf or have difficulty hearing?: No Does the patient have difficulty seeing, even when wearing  glasses/contacts?: No Does the patient have difficulty concentrating, remembering, or making decisions?: No Patient able to express need for assistance with ADLs?: Yes Does the patient have difficulty dressing or bathing?: No Independently performs ADLs?: Yes (appropriate for developmental age) Does the patient have difficulty walking or climbing stairs?: No Weakness of Legs: None Weakness of Arms/Hands: None  Permission Sought/Granted                  Emotional Assessment Appearance:: Appears stated age Attitude/Demeanor/Rapport: Engaged Affect (typically observed): Accepting Orientation: : Oriented to Self, Oriented to Place, Oriented to  Time, Oriented to Situation Alcohol / Substance Use: Not Applicable Psych Involvement: No (comment)  Admission diagnosis:  Symptomatic anemia [D64.9] Upper GI bleed [K92.2] Patient Active Problem List   Diagnosis Date Noted  . Upper GI bleed 10/19/2019  . Symptomatic anemia 10/18/2019  . Elevated troponin 10/18/2019  . Chronic anticoagulation, recent, Xarelto  10/18/2019  . Chronic diastolic CHF (congestive heart failure) ([place]) 10/18/2019  . Hypertensive urgency 10/18/2019  . Hyponatremia 10/18/2019  . Pulmonary nodule 1 cm or greater in diameter 10/18/2019  . Thyroid nodule greater than or equal to 1.5 cm in diameter incidentally noted on imaging study 10/18/2019  . CKD (chronic kidney disease) stage 3, GFR 30-59 ml/min 10/18/2019  . History of GI bleed 10/18/2019  . Microcytic anemia 10/16/2019  . Acquired thrombophilia ([place]) 10/09/2019  . Senile osteoporosis 06/11/2019  . Current severe  episode of major depressive disorder without psychotic features ([place]) 06/11/2019  . Myalgia due to statin 06/11/2019  . Esophageal dysphagia 05/03/2018  . Risk for falls 03/21/2018  . Iron deficiency anemia 03/18/2018  . Weakness of both legs 10/28/2017  . Other age-related cataract 05/31/2016  . Paroxysmal atrial fibrillation ([place]) 04/15/2016   . Skin lesion of breast 11/21/2013  . CHF (congestive heart failure) ([place]) 10/16/2013  . Other seborrheic keratosis 01/03/2013  . Coronary atherosclerosis of native coronary artery 01/03/2013  . Vitamin D deficiency 12/19/2012  . Mixed hyperlipidemia 12/19/2012  . Essential (primary) hypertension 12/19/2012  . Chronic obstructive pulmonary disease, unspecified ([place]) 12/19/2012  . Generalized osteoarthrosis, involving multiple sites 12/19/2012  . Urinary incontinence 12/19/2012   PCP:  Glean Hess, MD Pharmacy:   [facility], [address] [place] [address] Phone: [phone] Fax: [phone]  [facility] - [place], [place] [address] Phone: [phone] Fax: [phone]     Social Determinants of Health (SDOH) Interventions    Readmission Risk Interventions No flowsheet data found.

## 2019-10-23 ENCOUNTER — Encounter: Payer: Self-pay | Admitting: Gastroenterology

## 2019-10-23 ENCOUNTER — Inpatient Hospital Stay: Payer: Medicare HMO

## 2019-10-23 LAB — CBC
HCT: 26.2 % — ABNORMAL LOW (ref 36.0–46.0)
Hemoglobin: 8.2 g/dL — ABNORMAL LOW (ref 12.0–15.0)
MCH: 22.7 pg — ABNORMAL LOW (ref 26.0–34.0)
MCHC: 31.3 g/dL (ref 30.0–36.0)
MCV: 72.4 fL — ABNORMAL LOW (ref 80.0–100.0)
Platelets: 72 10*3/uL — ABNORMAL LOW (ref 150–400)
RBC: 3.62 MIL/uL — ABNORMAL LOW (ref 3.87–5.11)
RDW: 18.6 % — ABNORMAL HIGH (ref 11.5–15.5)
WBC: 7.9 10*3/uL (ref 4.0–10.5)
nRBC: 0 % (ref 0.0–0.2)

## 2019-10-23 LAB — MAGNESIUM: Magnesium: 2.3 mg/dL (ref 1.7–2.4)

## 2019-10-23 LAB — BASIC METABOLIC PANEL
Anion gap: 6 (ref 5–15)
BUN: 13 mg/dL (ref 8–23)
CO2: 26 mmol/L (ref 22–32)
Calcium: 8.8 mg/dL — ABNORMAL LOW (ref 8.9–10.3)
Chloride: 104 mmol/L (ref 98–111)
Creatinine, Ser: 1.26 mg/dL — ABNORMAL HIGH (ref 0.44–1.00)
GFR calc Af Amer: 46 mL/min — ABNORMAL LOW (ref >60)
GFR calc non Af Amer: 40 mL/min — ABNORMAL LOW (ref >60)
Glucose, Bld: 107 mg/dL — ABNORMAL HIGH (ref 70–99)
Potassium: 3.4 mmol/L — ABNORMAL LOW (ref 3.5–5.1)
Sodium: 136 mmol/L (ref 135–145)

## 2019-10-23 LAB — SURGICAL PATHOLOGY

## 2019-10-23 MED ORDER — GABAPENTIN 300 MG PO CAPS: 300.0000 mg | ORAL_CAPSULE | Freq: Three times a day (TID) | ORAL | 2 refills | Status: DC

## 2019-10-23 MED ORDER — FUROSEMIDE 40 MG PO TABS: 40.0000 mg | ORAL_TABLET | Freq: Two times a day (BID) | ORAL | Status: DC

## 2019-10-23 MED ORDER — FERROUS GLUCONATE 240 (27 FE) MG PO TABS: 240.0000 mg | ORAL_TABLET | Freq: Every day | ORAL | 2 refills | Status: DC

## 2019-10-23 MED ORDER — RAMIPRIL 10 MG PO CAPS
10.0000 mg | ORAL_CAPSULE | Freq: Two times a day (BID) | ORAL | Status: DC
  Administered 2019-10-23: 10 mg via ORAL
  Filled 2019-10-23 (×2): qty 1

## 2019-10-23 MED ORDER — POTASSIUM CHLORIDE CRYS ER 20 MEQ PO TBCR
40.0000 meq | EXTENDED_RELEASE_TABLET | Freq: Once | ORAL | Status: AC
  Administered 2019-10-23: 40 meq via ORAL
  Filled 2019-10-23: qty 2

## 2019-10-23 NOTE — Discharge Summary (Signed)
Physician Discharge Summary   Jessica Howell  female DOB: 12-09-1934  XHB:[id number]  PCP: Jessica Hess, MD  Admit date: 10/18/2019 Discharge date:  10/23/2019  Admitted From: home Disposition:  Home.  Daughter updated on the phone about discharge plans prior to discharge.  Home Health: No  Pt declined citing no need.  CODE STATUS: DNR  Discharge Instructions    Discharge instructions   Complete by: As directed    GI completed workup, and did not identify any bleeding source.  You have received IV iron twice for your iron deficiency.  Please continue to take oral iron supplements.  Please continue to follow up with hematology and primary care doctor (PCP) for your anemia.  Please follow up with PCP 1 week after discharge to check your kidney function and electrolytes.    Your blood pressure varied widely while inpatient.  Please monitor blood pressure at home and follow up with PCP for further management.   Dr. Enzo Bi [facility] [place] Course:  For full details, please see H&P, progress notes, consult notes and ancillary notes.  Briefly,  Jessica Mangumis a 85 y.o.Caucasian femalewith medical history significant forCAD, COPD, diastolic CHF, HTN, depression, paroxysmal atrial fibrillation, recently on Xarelto, discontinued on 10/13/2019 by Dr. Nehemiah Howell secondary to anemia, and with history of need for IV iron in the past, who presented to the emergency room with several months history of episodic retrosternal chest pain, dyspnea on exertion for which she recently undergone extensive evaluation.   Dyspnea and chest pain 2/2 Symptomatic anemia Patient presented with chest pain and shortness of breath with finding of hemoglobin of 6.1, down from 14.6 about 4 months prior.  Pt was recently on Xarelto, but was  discontinued on 10/13/19 due to dropping Hgb.  Pt said she had not had GI procedures for anemia workup due to her cardiac hx and associated risk.  Pt had pain  episode after IV iron on 10/16/2019 in outpatient clinic, though has received IV iron in the past for suspected chronic GI blood loss, per review of records from [facility].    Anemia work-up during this hospitalization revealed iron of 11, ferritin 10 and increased TIBC.  Pt received 1u pRBC on 6/24 with chest pain episode afterwards, cardiac workup neg.  IV venofer given on 6/26 and 6/28, no issues.  GI was consulted who performed full workup with EGD, colonoscopy and capsule study, all unremarkable.  Pt was supplemented with IV iron and discharged on oral iron.  If Hgb continues to drop, pt was advised to follow up with hematology.  Acute on chronic diastolic CHF exacerbation Pt presented with dyspnea, elevated respiratory rate, elevated BNP to 868, hypernatremia and AKI, likely due to volume overload and congestion.  Respiratory rate, Na, Cr all improved after IV lasix, further supporting that pt was fluid overloaded.  Last complete Echo in Dec 2019 showed grade II diastolic dysfunction.  Jessica dyspnea resolved after inpatient IV diuresis and was discharged home on home Lasix 40 mg BID.  Chest pain, ACS ruled out Elevated troponin 2/2 demand ischemia Coronary atherosclerosis of native coronary artery Patient had troponin elevation38>>47>>63>>67>>60>>48, in association with reports of chest pain.  Trop elevation likely due to demand ischemia from anemia.  Etiology of chest pain was unclear, however, pt had no more chest pain after blood transfusion on 6/24.  Pt is already scheduled for outpatient cardiac stress test.  Continued beta-blockers, statins  AKI, improved Cr 1.51 on  presentation.  BP elevated, labs did not suggest dehydration.  BNP elevated, so likely due to congestion, especially since Cr improved after IV lasix.  On discharge, pt was resumed on home Lasix 40 mg BID.  Home ramipril held until outpatient followup.  Cr 1.26 on the day of discharge.  Hypertensive urgency BP 208/133 on  presentation, and remained elevated, until 6/28 when it went down to 100's.  I had attempted to add hydralazine to Jessica BP regimen, however, since Jessica BP varied so widely and dropped to 100's, new hydralazine was not continued.  Home metop continued.  Home ramipirl held 2/2 AKI.  Nightly clonidine was d/c'ed due to risk of rebound hypertension with just once a day dosing.  Pt was advised to check BP at home and followup with outpatient provider for further BP management.  Hyponatremia due to hypervolemia Sodium 127, suspect hypervolemic from heart failure exacerbation.  Improved to 137 with IV lasix.  Paroxysmal atrial fibrillation (HCC) Currently in sinus rhythm.  Xarelto was discontinued on 10/13/2019 due to symptomatic anemia.  Continued home metop  Esophageal dysphagia No acute concerns.    Chronic obstructive pulmonary disease, unspecified ([place]) Not acutely exacerbated.  Continued home daily bronchodilators  Pulmonary nodule 1 cm or greater in diameter Incidental finding on CT from 10/16/2019.  Recommendations per radiology for outpatient follow-up  Thyroid nodule greater than or equal to 1.5 cm in diameter incidentally noted on imaging study Thyroid ultrasound can be arranged as outpatient  Anxiety and depression Followed by outpatient psych.  Jessica Howell recently passed away.  Pt lives alone and feels anxious a lot.  Continued home Xanax PRN and Restoril PRN.  Chronic pain   Likely neuropathy Pt takes Tramadol PRN for whole-body aches.  Also complained of pain in her feet, which could be neuropathy.  Continued home tramadol PRN.  Started pt on gabapentin 300 mg BID (new), to be titrated up by outpatient provider as needed.   Discharge Diagnoses:  Principal Problem:   Symptomatic anemia Active Problems:   Paroxysmal atrial fibrillation (HCC)   Essential (primary) hypertension   Esophageal dysphagia   Coronary atherosclerosis of native coronary artery    Chronic obstructive pulmonary disease, unspecified (HCC)   Elevated troponin   Chronic anticoagulation, recent, Xarelto    Chronic diastolic CHF (congestive heart failure) (HCC)   Hypertensive urgency   Hyponatremia   Pulmonary nodule 1 cm or greater in diameter   Thyroid nodule greater than or equal to 1.5 cm in diameter incidentally noted on imaging study   CKD (chronic kidney disease) stage 3, GFR 30-59 ml/min   History of GI bleed   Upper GI bleed    Discharge Instructions:  Allergies as of 10/23/2019      Reactions   Penicillins Anaphylaxis, Rash, Other (See Comments)   Azithromycin Other (See Comments)   abd pain   Gemfibrozil Nausea And Vomiting, Rash      Solifenacin    Mild urinary retention   Statins Nausea And Vomiting, Rash   Stomach pain      Medication List    STOP taking these medications   cloNIDine 0.2 MG tablet Commonly known as: CATAPRES     TAKE these medications   Advair Diskus 250-50 MCG/DOSE Aepb Generic drug: Fluticasone-Salmeterol Inhale 1 puff into the lungs 2 (two) times daily.   ALPRAZolam 0.5 MG tablet Commonly known as: XANAX Take 0.5 mg by mouth 3 (three) times daily as needed.   ferrous gluconate 240 (27 FE) MG  tablet Commonly known as: Iron 27 Take 1 tablet (240 mg total) by mouth daily.   furosemide 40 MG tablet Commonly known as: LASIX Take 1 tablet (40 mg total) by mouth 2 (two) times daily. Early morning and early afternoon. What changed:   when to take this  additional instructions   gabapentin 300 MG capsule Commonly known as: NEURONTIN Take 1 capsule (300 mg total) by mouth 3 (three) times daily. For neuropathy pain.   Melatonin 5 MG Chew Chew by mouth.   Metoprolol Succinate 200 MG Cs24 Take 200 mg by mouth daily.   montelukast 10 MG tablet Commonly known as: SINGULAIR Take by mouth at bedtime.   nitroGLYCERIN 0.4 MG SL tablet Commonly known as: Nitrostat Place 1 tablet (0.4 mg total) under the tongue  every 5 (five) minutes as needed for chest pain.   omeprazole 20 MG capsule Commonly known as: PRILOSEC TAKE 1 CAPSULE (20 MG TOTAL) BY MOUTH 2 (TWO) TIMES DAILY BEFORE A MEAL.   potassium chloride 10 MEQ tablet Commonly known as: KLOR-CON Take 10 mEq by mouth 2 (two) times daily.   ramipril 10 MG capsule Commonly known as: ALTACE Take 10 mg by mouth 2 (two) times daily.   temazepam 30 MG capsule Commonly known as: RESTORIL Take 30 mg by mouth at bedtime as needed.   traMADol 50 MG tablet Commonly known as: ULTRAM Take by mouth every 6 (six) hours as needed.   Ventolin HFA 108 (90 Base) MCG/ACT inhaler Generic drug: albuterol every 6 (six) hours as needed.   Vitamin D 125 MCG (5000 UT) Caps Take 1 capsule by mouth daily.   vitamin E 180 MG (400 UNITS) capsule Take 400 Units by mouth daily.        Follow-up Information    Jessica Hess, MD. Go on 11/05/2019.   Specialty: Internal Medicine Why: @ 3pm Contact information: [place] [address] [phone]               Allergies  Allergen Reactions  . Penicillins Anaphylaxis, Rash and Other (See Comments)  . Azithromycin Other (See Comments)    abd pain   . Gemfibrozil Nausea And Vomiting and Rash       . Solifenacin     Mild urinary retention  . Statins Nausea And Vomiting and Rash    Stomach pain       The results of significant diagnostics from this hospitalization (including imaging, microbiology, ancillary and laboratory) are listed below for reference.   Consultations:   Procedures/Studies: DG Chest 2 View  Result Date: 10/17/2019 CLINICAL DATA:  Shortness of breath EXAM: CHEST - 2 VIEW COMPARISON:  CT chest 10/16/2019 FINDINGS: There is mild bilateral interstitial thickening. There is no focal consolidation. There is no pleural effusion or pneumothorax. There is cardiomegaly. There is thoracic aortic atherosclerosis. There is a healed right humeral neck  fracture. There is a healed left humeral neck fracture transfixed with a metallic sideplate and interlocking screws. IMPRESSION: No active cardiopulmonary disease. Electronically Signed   By: Kathreen Devoid   On: 10/17/2019 19:09   CT Angio Chest/Abd/Pel for Dissection W and/or Wo Contrast  Result Date: 10/16/2019 CLINICAL DATA:  85 year old female with chest pain. Concern for acute aortic syndrome. EXAM: CT ANGIOGRAPHY CHEST, ABDOMEN AND PELVIS TECHNIQUE: Non-contrast CT of the chest was initially obtained. Multidetector CT imaging through the chest, abdomen and pelvis was performed using the standard protocol during bolus administration of intravenous contrast. Multiplanar reconstructed  images and MIPs were obtained and reviewed to evaluate the vascular anatomy. CONTRAST:  62mL OMNIPAQUE IOHEXOL 350 MG/ML SOLN COMPARISON:  None. FINDINGS: CTA CHEST FINDINGS Cardiovascular: There is mild cardiomegaly. No pericardial effusion. Advanced 3 vessel coronary vascular calcification. There is advanced atherosclerotic calcification of the thoracic aorta. No aneurysmal dilatation or dissection. The origins of the great vessels of the aortic arch appear patent as visualized. There is mild dilatation of main pulmonary trunk suggestive of pulmonary hypertension. Clinical correlation is recommended. Evaluation of the pulmonary arteries is limited due to suboptimal opacification and timing of the contrast. The central pulmonary arteries appear patent as visualized. Mediastinum/Nodes: Mildly enlarged right hilar lymph node measures 16 cm in short axis. No mediastinal adenopathy. The esophagus is grossly unremarkable. There is a 2 cm heterogeneous right thyroid nodule. Recommend thyroid US (ref: J Am Coll Radiol. 2015 Feb;12(2): 143-50).No mediastinal fluid collection. Lungs/Pleura: There is a 1 cm nodule along the superior aspect of the right major fissure. No focal consolidation, pleural effusion, pneumothorax. The central  airways are patent. Musculoskeletal: Mild degenerative changes of the spine as well as arthritic changes of the shoulders. No acute osseous pathology. Review of the MIP images confirms the above findings. CTA ABDOMEN AND PELVIS FINDINGS VASCULAR Aorta: Advanced atherosclerotic calcification. No dissection or aneurysm. Celiac: There is atherosclerotic calcification of the origin of the celiac axis. The celiac artery and its main branches are patent. SMA: Atherosclerotic calcification of the proximal SMA. The SMA is patent. Renals: Atherosclerotic calcification of the renal arteries. The renal arteries remain patent. And accessory left renal artery noted. IMA: The IMA is patent. Inflow: Advanced atherosclerotic calcification of the iliac arteries. The iliac arteries remain patent. Veins: No obvious venous abnormality within the limitations of this arterial phase study. Review of the MIP images confirms the above findings. NON-VASCULAR No intra-abdominal free air or free fluid. Hepatobiliary: No focal liver abnormality is seen. No gallstones, gallbladder wall thickening, or biliary dilatation. Pancreas: Unremarkable. No pancreatic ductal dilatation or surrounding inflammatory changes. Spleen: Normal in size without focal abnormality. Adrenals/Urinary Tract: The adrenal glands unremarkable. There is no hydronephrosis on either side. There is thank more atrophy and cortical scarring of the inferior pole of the right kidney. Several left renal cysts measure up to 2.5 cm. Subcentimeter right renal hypodense lesion is too small to characterize. This can be better evaluated with ultrasound on a nonemergent/outpatient basis. The visualized ureters and urinary bladder appear unremarkable. Stomach/Bowel: There is no bowel obstruction or active inflammation. The appendix is normal. Lymphatic: No adenopathy. Reproductive: Hysterectomy. Other: A 3.7 x 2.1 cm chronic appearing complex collection in the subcutaneous soft tissues of  the left gluteal region. Additional fluid collection also noted more inferiorly. There is subcutaneous dystrophic calcification. A partially visualized larger fluid collection measuring at least 6.5 cm in the subcutaneous soft tissues of the right buttock. Musculoskeletal: Osteopenia. No acute osseous pathology. Review of the MIP images confirms the above findings. IMPRESSION: 1. No acute intrathoracic, abdominal, or pelvic pathology. No CT evidence of aortic dissection or aneurysm. 2. A 1 cm right upper lobe nodule along the major fissure. Consider one of the following in 3 months for both low-risk and high-risk individuals: (a) repeat chest CT, (b) follow-up PET-CT, or (c) tissue sampling. This recommendation follows the consensus statement: Guidelines for Management of Incidental Pulmonary Nodules Detected on CT Images: From the Fleischner Society 2017; Radiology 2017; 284:228-243. 3. Mild cardiomegaly with advanced 3 vessel coronary vascular calcification. 4. Mildly enlarged right hilar  lymph node, nonspecific. Clinical correlation is recommended. 5. A 2 cm heterogeneous right thyroid nodule. Recommend thyroid US (ref: J Am Coll Radiol. 6. Aortic Atherosclerosis (ICD10-I70.0). Electronically Signed   By: Anner Crete M.D.   On: 10/16/2019 17:09      Labs: BNP (last 3 results) Recent Labs    10/18/19 0216  BNP 809.9*   Basic Metabolic Panel: Recent Labs  Lab 10/19/19 0647 10/20/19 1136 10/21/19 0540 10/22/19 0356 10/23/19 0406  NA 137 133* 139 137 136  K 3.8 3.3* 3.7 3.5 3.4*  CL 104 100 106 106 104  CO2 27 27 27 27 26   GLUCOSE 97 134* 109* 101* 107*  BUN 15 10 12  6* 13  CREATININE 1.20* 1.27* 1.17* 1.02* 1.26*  CALCIUM 8.9 9.2 9.1 9.2 8.8*  MG 2.4 2.3 2.4 2.3 2.3   Liver Function Tests: Recent Labs  Lab 10/16/19 1437 10/17/19 1830  AST 20 27  ALT 18 20  ALKPHOS 66 53  BILITOT 0.5 0.6  PROT 7.7 6.8  ALBUMIN 4.0 3.7   No results for input(s): LIPASE, AMYLASE in the  last 168 hours. No results for input(s): AMMONIA in the last 168 hours. CBC: Recent Labs  Lab 10/16/19 1437 10/17/19 1830 10/19/19 0647 10/20/19 1136 10/21/19 0718 10/22/19 0356 10/23/19 0406  WBC 8.3   < > 6.7 6.0 8.1 8.9 7.9  NEUTROABS 5.2  --   --   --   --   --   --   HGB 6.7*   < > 7.9* 9.0* 8.3* 8.2* 8.2*  HCT 22.6*   < > 24.4* 29.6* 27.0* 27.5* 26.2*  MCV 71.3*   < > 70.9* 73.3* 72.2* 73.7* 72.4*  PLT Not Measured   < > PLATELET CLUMPS NOTED ON SMEAR, UNABLE TO ESTIMATE 148* PLATELET CLUMPS NOTED ON SMEAR, UNABLE TO ESITMATE 186 72*   < > = values in this interval not displayed.   Cardiac Enzymes: No results for input(s): CKTOTAL, CKMB, CKMBINDEX, TROPONINI in the last 168 hours. BNP: Invalid input(s): POCBNP CBG: No results for input(s): GLUCAP in the last 168 hours. D-Dimer No results for input(s): DDIMER in the last 72 hours. Hgb A1c No results for input(s): HGBA1C in the last 72 hours. Lipid Profile No results for input(s): CHOL, HDL, LDLCALC, TRIG, CHOLHDL, LDLDIRECT in the last 72 hours. Thyroid function studies No results for input(s): TSH, T4TOTAL, T3FREE, THYROIDAB in the last 72 hours.  Invalid input(s): FREET3 Anemia work up No results for input(s): VITAMINB12, FOLATE, FERRITIN, TIBC, IRON, RETICCTPCT in the last 72 hours. Urinalysis    Component Value Date/Time   BILIRUBINUR neg 10/09/2019 1114   PROTEINUR Negative 10/09/2019 1114   UROBILINOGEN 0.2 10/09/2019 1114   NITRITE neg 10/09/2019 1114   LEUKOCYTESUR Small (1+) (A) 10/09/2019 1114   Sepsis Labs Invalid input(s): PROCALCITONIN,  WBC,  LACTICIDVEN Microbiology Recent Results (from the past 240 hour(s))  SARS Coronavirus 2 by RT PCR (hospital order, performed in [facility] hospital lab) Nasopharyngeal Nasopharyngeal Swab     Status: None   Collection Time: 10/18/19  4:00 AM   Specimen: Nasopharyngeal Swab  Result Value Ref Range Status   SARS Coronavirus 2 NEGATIVE NEGATIVE Final     Comment: (NOTE) SARS-CoV-2 target nucleic acids are NOT DETECTED.  The SARS-CoV-2 RNA is generally detectable in upper and lower respiratory specimens during the acute phase of infection. The lowest concentration of SARS-CoV-2 viral copies this assay can detect is 250 copies / mL. A negative result  does not preclude SARS-CoV-2 infection and should not be used as the sole basis for treatment or other patient management decisions.  A negative result may occur with improper specimen collection / handling, submission of specimen other than nasopharyngeal swab, presence of viral mutation(s) within the areas targeted by this assay, and inadequate number of viral copies (<250 copies / mL). A negative result must be combined with clinical observations, patient history, and epidemiological information.  Fact Sheet for Patients:   StrictlyIdeas.no  Fact Sheet for Healthcare Providers: BankingDealers.co.za  This test is not yet approved or  cleared by the [place] FDA and has been authorized for detection and/or diagnosis of SARS-CoV-2 by FDA under an Emergency Use Authorization (EUA).  This EUA will remain in effect (meaning this test can be used) for the duration of the COVID-19 declaration under Section 564(b)(1) of the Act, 21 U.S.C. section 360bbb-3(b)(1), unless the authorization is terminated or revoked sooner.  Performed at [facility], [address] [place], [place] [address]      Total time spend on discharging this patient, including the last patient exam, discussing the hospital stay, instructions for ongoing care as it relates to all pertinent caregivers, as well as preparing the medical discharge records, prescriptions, and/or referrals as applicable, is 50 minutes.    Jessica Bi, MD  [facility] 10/23/2019, 10:41 AM  If 7PM-7AM, please contact night-coverage

## 2019-10-24 ENCOUNTER — Telehealth: Payer: Self-pay

## 2019-10-24 NOTE — Telephone Encounter (Signed)
Attempted to reach patient for TCM call and confirm hospital follow up appt. Left msg for patient to reach me at [phone] or contact the office at [phone].

## 2019-10-25 NOTE — Telephone Encounter (Signed)
Second attempt to reach patient for TCM call and confirm hospital follow up appt for 11/05/19. No answer and unable to leave message.

## 2019-10-30 ENCOUNTER — Encounter: Payer: Self-pay | Admitting: Gastroenterology

## 2019-11-01 LAB — FERRITIN: Ferritin: 17 ng/mL (ref 15–150)

## 2019-11-01 LAB — IRON AND TIBC
Iron Saturation: 2 % — CL (ref 15–55)
Iron: 11 ug/dL — ABNORMAL LOW (ref 27–139)
Total Iron Binding Capacity: 456 ug/dL — ABNORMAL HIGH (ref 250–450)
UIBC: 445 ug/dL — ABNORMAL HIGH (ref 118–369)

## 2019-11-01 LAB — SPECIMEN STATUS REPORT

## 2019-11-02 ENCOUNTER — Other Ambulatory Visit: Payer: Self-pay

## 2019-11-02 DIAGNOSIS — D509 Iron deficiency anemia, unspecified: Secondary | ICD-10-CM

## 2019-11-05 ENCOUNTER — Other Ambulatory Visit: Payer: Self-pay

## 2019-11-05 ENCOUNTER — Encounter: Payer: Self-pay | Admitting: Internal Medicine

## 2019-11-05 ENCOUNTER — Ambulatory Visit (INDEPENDENT_AMBULATORY_CARE_PROVIDER_SITE_OTHER): Payer: Medicare HMO | Admitting: Internal Medicine

## 2019-11-05 VITALS — BP 122/70 | HR 76 | Temp 98.5°F | Ht 60.0 in | Wt 139.0 lb

## 2019-11-05 DIAGNOSIS — R911 Solitary pulmonary nodule: Secondary | ICD-10-CM

## 2019-11-05 DIAGNOSIS — E041 Nontoxic single thyroid nodule: Secondary | ICD-10-CM | POA: Diagnosis not present

## 2019-11-05 DIAGNOSIS — I10 Essential (primary) hypertension: Secondary | ICD-10-CM | POA: Diagnosis not present

## 2019-11-05 DIAGNOSIS — F322 Major depressive disorder, single episode, severe without psychotic features: Secondary | ICD-10-CM

## 2019-11-05 DIAGNOSIS — N183 Chronic kidney disease, stage 3 unspecified: Secondary | ICD-10-CM

## 2019-11-05 DIAGNOSIS — I700 Atherosclerosis of aorta: Secondary | ICD-10-CM

## 2019-11-05 DIAGNOSIS — G629 Polyneuropathy, unspecified: Secondary | ICD-10-CM | POA: Insufficient documentation

## 2019-11-05 MED ORDER — FUROSEMIDE 40 MG PO TABS: 40.0000 mg | ORAL_TABLET | Freq: Two times a day (BID) | ORAL | 1 refills | Status: DC

## 2019-11-05 MED ORDER — GABAPENTIN 100 MG PO CAPS: 100.0000 mg | ORAL_CAPSULE | Freq: Two times a day (BID) | ORAL | 1 refills | Status: DC

## 2019-11-05 NOTE — Progress Notes (Signed)
Date:  11/05/2019   Name:  Jessica Howell   DOB:  [birth year]   MRN:  [id number]   Chief Complaint: Hospitalization Follow-up (anemia ) Hospital follow up from [facility] 10/18/19 to 10/23/19.  TOC call attemped x 2 on 10/23/29 and 10/25/19.  Dyspnea and chest pain 2/2 Symptomatic anemia Patient presented with chest pain and shortness of breath with finding of hemoglobin of 6.1, down from 14.6 about 4 months prior.  Pt was recently on Xarelto, but was  discontinued on 10/13/19 due to dropping Hgb. Pt said she had not had GI procedures for anemia workup due to her cardiac hx and associated risk.  Pt had pain episode after IV iron on 10/16/2019 in outpatient clinic, though has received IV iron in the past for suspected chronic GI blood loss, per review of records from [facility].    Anemia work-up during this hospitalization revealed iron of 11, ferritin 10 and increased TIBC.  Pt received 1u pRBC on 6/24 with chest pain episode afterwards, cardiac workup neg. IV venofer given on 6/26 and 6/28, no issues.  GI was consulted who performed full workup with EGD, colonoscopy and capsule study, all unremarkable.  Pt was supplemented with IV iron and discharged on oral iron.  If Hgb continues to drop, pt was advised to follow up with hematology.  Acute on chronic diastolic CHF exacerbation Pt presented with dyspnea, elevated respiratory rate, elevated BNP to 868, hypernatremia and AKI, likely due to volume overload and congestion. Respiratory rate, Na, Cr all improved after IV lasix, further supporting that pt was fluid overloaded.  Last complete Echo in Dec 2019 showed grade II diastolic dysfunction.  Pt's dyspnea resolved after inpatient IV diuresis and was discharged home on home Lasix 40 mg BID.  Chest pain, ACS ruled out Elevated troponin 2/2 demand ischemia Coronary atherosclerosis of native coronary artery Patient had troponin elevation38>>47>>63>>67>>60>>48, in association with reports of chest pain.  Trop  elevation likely due to demand ischemia from anemia.  Etiology of chest pain was unclear, however, pt had no more chest pain after blood transfusion on 6/24.  Pt is already scheduled for outpatient cardiac stress test.  Continued beta-blockers, statins  AKI, improved Cr 1.51 on presentation.BP elevated, labs did not suggest dehydration. BNP elevated, so likely due to congestion, especially since Cr improved after IV lasix.  On discharge, pt was resumed on home Lasix 40 mg BID.  Home ramipril held until outpatient followup.  Cr 1.26 on the day of discharge.  Hypertensive urgency BP 208/133 on presentation, and remained elevated, until 6/28 when it went down to 100's.  I had attempted to add hydralazine to pt's BP regimen, however, since pt's BP varied so widely and dropped to 100's, new hydralazine was not continued.  Home metop continued.  Home ramipirl held 2/2 AKI.  Nightly clonidine was d/c'ed due to risk of rebound hypertension with just once a day dosing.  Pt was advised to check BP at home and followup with outpatient provider for further BP management.  Hyponatremia due to hypervolemia Sodium 127, suspect hypervolemic from heart failure exacerbation. Improved to 137 with IV lasix.  Paroxysmal atrial fibrillation (HCC) Currently in sinus rhythm.  Xarelto was discontinued on 10/13/2019 due to symptomatic anemia.  Continued home metop  Esophageal dysphagia No acute concerns.    Chronic obstructive pulmonary disease, unspecified ([place]) Not acutely exacerbated.  Continued home daily bronchodilators  Pulmonary nodule 1 cm or greater in diameter Incidental finding on CT from 10/16/2019.  Recommendations  per radiology for outpatient follow-up  Thyroid nodule greater than or equal to 1.5 cm in diameter incidentally noted on imaging study Thyroid ultrasound can be arranged as outpatient  Anxiety and depression Followed by outpatient psych. Pt's husband recently passed  away. Pt lives alone and feels anxious a lot.  Continued home Xanax PRN and Restoril PRN.  Chronic pain  Likely neuropathy Pt takes Tramadol PRN for whole-body aches. Also complained of pain in her feet, which could be neuropathy.  Continued home tramadol PRN.  Started pt on gabapentin 300 mg BID (new), to be titrated up by outpatient provider as needed.  Hypertension This is a chronic problem. The problem is controlled. Pertinent negatives include no chest pain, headaches, palpitations or shortness of breath. Past treatments include diuretics and ACE inhibitors. The current treatment provides significant improvement. There are no compliance problems.  Identifiable causes of hypertension include a thyroid problem.  Depression        This is a chronic problem.The problem is unchanged.  Associated symptoms include no fatigue, no appetite change and no headaches.     The symptoms are aggravated by family issues.  Treatments tried: currently on temazepam, xanax and tramadol - all prescribed by Psych.  Compliance with treatment is good.  Previous treatment provided moderate (but still has depressive sx - not currently on SSRI and pt can not recall taking other medications) relief.  Past medical history includes thyroid problem.   Anemia Presents for follow-up visit. There has been no abdominal pain, fever or palpitations. (Seeing Hematology - getting iron infusions this week, CBC tomorrow)  Thyroid Problem Presents for initial visit. Onset time: thyroid nodule noted on CT. Symptoms include anxiety and depressed mood. Patient reports no fatigue, hoarse voice, palpitations or tremors.  Pulmonary nodule - seen on CT scan.  Pt has remote hx of smoking.  Per guidelines, recommend repeat CT in 3 months.   Lab Results  Component Value Date   CREATININE 1.26 (H) 10/23/2019   BUN 13 10/23/2019   NA 136 10/23/2019   K 3.4 (L) 10/23/2019   CL 104 10/23/2019   CO2 26 10/23/2019   Lab Results    Component Value Date   CHOL 227 (H) 10/09/2019   HDL 48 10/09/2019   LDLCALC 156 (H) 10/09/2019   TRIG 129 10/09/2019   CHOLHDL 4.7 (H) 10/09/2019   Lab Results  Component Value Date   TSH 2.840 10/16/2019   Lab Results  Component Value Date   HGBA1C 5.5 02/03/2018   Lab Results  Component Value Date   WBC 7.9 10/23/2019   HGB 8.2 (L) 10/23/2019   HCT 26.2 (L) 10/23/2019   MCV 72.4 (L) 10/23/2019   PLT 72 (L) 10/23/2019   Lab Results  Component Value Date   ALT 20 10/17/2019   AST 27 10/17/2019   ALKPHOS 53 10/17/2019   BILITOT 0.6 10/17/2019     Review of Systems  Constitutional: Negative for appetite change, fatigue, fever and unexpected weight change.  HENT: Negative for hoarse voice, tinnitus and trouble swallowing.   Eyes: Negative for visual disturbance.  Respiratory: Negative for cough, chest tightness and shortness of breath.   Cardiovascular: Negative for chest pain, palpitations and leg swelling.  Gastrointestinal: Negative for abdominal pain.  Endocrine: Negative for polydipsia and polyuria.  Genitourinary: Negative for dysuria and hematuria.  Musculoskeletal: Negative for arthralgias.  Allergic/Immunologic: Negative for environmental allergies.  Neurological: Positive for numbness (neuropathy of both feet). Negative for tremors and headaches.  Psychiatric/Behavioral:  Positive for depression and sleep disturbance. Negative for dysphoric mood. The patient is nervous/anxious.     Patient Active Problem List   Diagnosis Date Noted  . Upper GI bleed 10/19/2019  . Symptomatic anemia 10/18/2019  . Elevated troponin 10/18/2019  . Chronic anticoagulation, recent, Xarelto  10/18/2019  . Chronic diastolic CHF (congestive heart failure) ([place]) 10/18/2019  . Hyponatremia 10/18/2019  . Pulmonary nodule 1 cm or greater in diameter 10/18/2019  . Thyroid nodule greater than or equal to 1.5 cm in diameter incidentally noted on imaging study 10/18/2019  . CKD  (chronic kidney disease) stage 3, GFR 30-59 ml/min 10/18/2019  . History of GI bleed 10/18/2019  . Microcytic anemia 10/16/2019  . Acquired thrombophilia ([place]) 10/09/2019  . Senile osteoporosis 06/11/2019  . Current severe episode of major depressive disorder without psychotic features ([place]) 06/11/2019  . Myalgia due to statin 06/11/2019  . Esophageal dysphagia 05/03/2018  . Risk for falls 03/21/2018  . Iron deficiency anemia 03/18/2018  . Weakness of both legs 10/28/2017  . Other age-related cataract 05/31/2016  . Paroxysmal atrial fibrillation ([place]) 04/15/2016  . Skin lesion of breast 11/21/2013  . CHF (congestive heart failure) ([place]) 10/16/2013  . Other seborrheic keratosis 01/03/2013  . Coronary atherosclerosis of native coronary artery 01/03/2013  . Vitamin D deficiency 12/19/2012  . Mixed hyperlipidemia 12/19/2012  . Essential (primary) hypertension 12/19/2012  . Chronic obstructive pulmonary disease, unspecified ([place]) 12/19/2012  . Generalized osteoarthrosis, involving multiple sites 12/19/2012  . Urinary incontinence 12/19/2012    Allergies  Allergen Reactions  . Penicillins Anaphylaxis, Rash and Other (See Comments)  . Azithromycin Other (See Comments)    abd pain   . Gemfibrozil Nausea And Vomiting and Rash       . Solifenacin     Mild urinary retention  . Statins Nausea And Vomiting and Rash    Stomach pain      Past Surgical History:  Procedure Laterality Date  . ABDOMINAL HYSTERECTOMY    . BREAST SURGERY    . COLONOSCOPY WITH PROPOFOL N/A 10/21/2019   Procedure: COLONOSCOPY WITH PROPOFOL;  Surgeon: Jonathon Bellows, MD;  Location[facility] ENDOSCOPY;  Service: Gastroenterology;  Laterality: N/A;  . ESOPHAGOGASTRODUODENOSCOPY (EGD) WITH PROPOFOL N/A 10/20/2019   Procedure: ESOPHAGOGASTRODUODENOSCOPY (EGD) WITH PROPOFOL;  Surgeon: Lin Landsman, MD;  Location: [facility] ENDOSCOPY;  Service: Gastroenterology;  Laterality: N/A;  . FOREARM SURGERY    . GIVENS CAPSULE  STUDY N/A 10/21/2019   Procedure: GIVENS CAPSULE STUDY;  Surgeon: Jonathon Bellows, MD;  Location: [facility] ENDOSCOPY;  Service: Gastroenterology;  Laterality: N/A;    Social History   Tobacco Use  . Smoking status: Former Smoker    Packs/day: 0.50    Years: 45.00    Pack years: 22.50    Types: Cigarettes    Quit date: 06/10/2004    Years since quitting: 15.4  . Smokeless tobacco: Never Used  Vaping Use  . Vaping Use: Never used  Substance Use Topics  . Alcohol use: Never  . Drug use: Not Currently    Medication list has been reviewed and updated.  Current Meds  Medication Sig  . albuterol (VENTOLIN HFA) 108 (90 Base) MCG/ACT inhaler every 6 (six) hours as needed.   . ALPRAZolam (XANAX) 0.5 MG tablet Take 0.5 mg by mouth 3 (three) times daily as needed.   . Cholecalciferol (VITAMIN D) 125 MCG (5000 UT) CAPS Take 1 capsule by mouth daily.   . ferrous gluconate (IRON 27) 240 (27 FE) MG tablet Take  1 tablet (240 mg total) by mouth daily.  . Fluticasone-Salmeterol (ADVAIR DISKUS) 250-50 MCG/DOSE AEPB Inhale 1 puff into the lungs 2 (two) times daily.   . furosemide (LASIX) 40 MG tablet Take 1 tablet (40 mg total) by mouth 2 (two) times daily. Early morning and early afternoon.  . gabapentin (NEURONTIN) 300 MG capsule Take 1 capsule (300 mg total) by mouth 3 (three) times daily. For neuropathy pain.  . Melatonin 5 MG CHEW Chew by mouth.   . metoprolol (TOPROL-XL) 200 MG 24 hr tablet Take 200 mg by mouth daily.  . montelukast (SINGULAIR) 10 MG tablet Take by mouth at bedtime.   . nitroGLYCERIN (NITROSTAT) 0.4 MG SL tablet Place 1 tablet (0.4 mg total) under the tongue every 5 (five) minutes as needed for chest pain.  Marland Kitchen omeprazole (PRILOSEC) 20 MG capsule TAKE 1 CAPSULE (20 MG TOTAL) BY MOUTH 2 (TWO) TIMES DAILY BEFORE A MEAL.  Marland Kitchen potassium chloride (KLOR-CON) 10 MEQ tablet Take 10 mEq by mouth 2 (two) times daily.   . ramipril (ALTACE) 10 MG capsule Take 10 mg by mouth 2 (two) times daily.   .  temazepam (RESTORIL) 30 MG capsule Take 30 mg by mouth at bedtime as needed.   . traMADol (ULTRAM) 50 MG tablet Take by mouth every 6 (six) hours as needed.  Marland Kitchen VITAMIN D PO Take 400 Units by mouth daily.  . vitamin E 180 MG (400 UNITS) capsule Take 400 Units by mouth daily.     PHQ 2/9 Scores 11/05/2019 10/09/2019 09/26/2019 06/11/2019  PHQ - 2 Score 3 2 6 6   PHQ- 9 Score 7 9 18 23     GAD 7 : Generalized Anxiety Score 11/05/2019 10/09/2019  Nervous, Anxious, on Edge 1 2  Control/stop worrying 2 2  Worry too much - different things 2 2  Trouble relaxing 2 2  Restless 0 1  Easily annoyed or irritable 0 1  Afraid - awful might happen 0 2  Total GAD 7 Score 7 12  Anxiety Difficulty Not difficult at all Not difficult at all    BP Readings from Last 3 Encounters:  11/05/19 122/70  10/23/19 (!) 186/86  10/17/19 (!) 150/67    Physical Exam Vitals and nursing note reviewed.  Constitutional:      General: She is not in acute distress.    Appearance: Normal appearance. She is well-developed.  HENT:     Head: Normocephalic and atraumatic.  Neck:     Vascular: No carotid bruit.  Cardiovascular:     Rate and Rhythm: Normal rate and regular rhythm.     Pulses: Normal pulses.     Heart sounds: No murmur heard.   Pulmonary:     Effort: Pulmonary effort is normal. No respiratory distress.  Musculoskeletal:     Cervical back: Normal range of motion.     Right lower leg: No edema.     Left lower leg: No edema.  Lymphadenopathy:     Cervical: No cervical adenopathy.  Skin:    General: Skin is warm and dry.     Capillary Refill: Capillary refill takes less than 2 seconds.     Findings: No rash.  Neurological:     General: No focal deficit present.     Mental Status: She is alert and oriented to person, place, and time.  Psychiatric:        Behavior: Behavior normal.        Thought Content: Thought content normal.  Wt Readings from Last 3 Encounters:  11/05/19 139 lb (63 kg)    10/18/19 143 lb 9.6 oz (65.1 kg)  10/17/19 143 lb 4.8 oz (65 kg)    BP 122/70   Pulse 76   Temp 98.5 F (36.9 C) (Oral)   Ht 5' (1.524 m)   Wt 139 lb (63 kg)   SpO2 93%   BMI 27.15 kg/m   Assessment and Plan: 1. Essential (primary) hypertension Clinically stable exam with well controlled BP on lasix and ramipril. Tolerating medications without side effects at this time. Pt to continue current regimen and low sodium diet; benefits of regular exercise as able discussed. - furosemide (LASIX) 40 MG tablet; Take 1 tablet (40 mg total) by mouth 2 (two) times daily. Early morning and early afternoon.  Dispense: 180 tablet; Refill: 1  2. Stage 3 chronic kidney disease, unspecified whether stage 3a or 3b CKD Recheck labs - Basic metabolic panel  3. Pulmonary nodule 1 cm or greater in diameter Recommend repeat CT in September - will order at next visit  4. Thyroid nodule greater than or equal to 1.5 cm in diameter incidentally noted on imaging study Recommend [place] to further evaluate - US THYROID; Future  5. Aortic atherosclerosis ([place]) On appropriate statin therapy  6. Neuropathy Will reduce dose from 300 mg tid to 100 mg bid due to oversedatioin - gabapentin (NEURONTIN) 100 MG capsule; Take 1 capsule (100 mg total) by mouth 2 (two) times daily. For neuropathy pain.  Dispense: 180 capsule; Refill: 1  7. Current severe episode of major depressive disorder without psychotic features, unspecified whether recurrent [facility]) Long discussion with patient and daughter Neoma Laming.  Depression started years ago after her son's death.  She has been seeing a psych for many years but has not apparently been on an SSRI or SSNI.  She has been prescribed Xanax bid, temazepam at HS and tramadol as needed for nerve pain. Her daughter asked if I would take over her medication but I am not comfortable with that - she is on too many sedating drugs. We can re-visit her situation next visit - this will give the  patient and her daughter a chance to discuss other treatment with the psychiatrist.   Partially dictated using Editor, commissioning. Any errors are unintentional.  Halina Maidens, MD [facility]  11/05/2019

## 2019-11-06 ENCOUNTER — Inpatient Hospital Stay: Payer: Medicare HMO | Admitting: Oncology

## 2019-11-06 ENCOUNTER — Encounter: Payer: Self-pay | Admitting: Oncology

## 2019-11-06 ENCOUNTER — Inpatient Hospital Stay: Payer: Medicare HMO | Attending: Oncology

## 2019-11-06 ENCOUNTER — Inpatient Hospital Stay: Payer: Medicare HMO

## 2019-11-06 VITALS — BP 198/89 | HR 70 | Temp 96.7°F | Resp 16 | Wt 139.3 lb

## 2019-11-06 DIAGNOSIS — J449 Chronic obstructive pulmonary disease, unspecified: Secondary | ICD-10-CM | POA: Diagnosis not present

## 2019-11-06 DIAGNOSIS — D509 Iron deficiency anemia, unspecified: Secondary | ICD-10-CM | POA: Insufficient documentation

## 2019-11-06 DIAGNOSIS — E538 Deficiency of other specified B group vitamins: Secondary | ICD-10-CM | POA: Insufficient documentation

## 2019-11-06 DIAGNOSIS — Z87891 Personal history of nicotine dependence: Secondary | ICD-10-CM | POA: Insufficient documentation

## 2019-11-06 LAB — BASIC METABOLIC PANEL
BUN/Creatinine Ratio: 9 — ABNORMAL LOW (ref 12–28)
BUN: 11 mg/dL (ref 8–27)
CO2: 27 mmol/L (ref 20–29)
Calcium: 10.2 mg/dL (ref 8.7–10.3)
Chloride: 99 mmol/L (ref 96–106)
Creatinine, Ser: 1.16 mg/dL — ABNORMAL HIGH (ref 0.57–1.00)
GFR calc Af Amer: 51 mL/min/{1.73_m2} — ABNORMAL LOW (ref >59)
GFR calc non Af Amer: 44 mL/min/{1.73_m2} — ABNORMAL LOW (ref >59)
Glucose: 96 mg/dL (ref 65–99)
Potassium: 4 mmol/L (ref 3.5–5.2)
Sodium: 138 mmol/L (ref 134–144)

## 2019-11-06 LAB — CBC WITH DIFFERENTIAL/PLATELET
Abs Immature Granulocytes: 0.01 10*3/uL (ref 0.00–0.07)
Basophils Absolute: 0.1 10*3/uL (ref 0.0–0.1)
Basophils Relative: 2 %
Eosinophils Absolute: 0 10*3/uL (ref 0.0–0.5)
Eosinophils Relative: 1 %
HCT: 36.4 % (ref 36.0–46.0)
Hemoglobin: 11.2 g/dL — ABNORMAL LOW (ref 12.0–15.0)
Immature Granulocytes: 0 %
Lymphocytes Relative: 27 %
Lymphs Abs: 1.7 10*3/uL (ref 0.7–4.0)
MCH: 23.5 pg — ABNORMAL LOW (ref 26.0–34.0)
MCHC: 30.8 g/dL (ref 30.0–36.0)
MCV: 76.5 fL — ABNORMAL LOW (ref 80.0–100.0)
Monocytes Absolute: 0.7 10*3/uL (ref 0.1–1.0)
Monocytes Relative: 12 %
Neutro Abs: 3.7 10*3/uL (ref 1.7–7.7)
Neutrophils Relative %: 58 %
Platelets: 399 10*3/uL (ref 150–400)
RBC: 4.76 MIL/uL (ref 3.87–5.11)
RDW: 23.7 % — ABNORMAL HIGH (ref 11.5–15.5)
Smear Review: ADEQUATE
WBC: 6.3 10*3/uL (ref 4.0–10.5)
nRBC: 0 % (ref 0.0–0.2)

## 2019-11-06 LAB — IRON AND TIBC
Iron: 32 ug/dL (ref 28–170)
Saturation Ratios: 8 % — ABNORMAL LOW (ref 10.4–31.8)
TIBC: 392 ug/dL (ref 250–450)
UIBC: 360 ug/dL

## 2019-11-06 NOTE — Progress Notes (Signed)
Hematology/Oncology Consult note [facility]  [phone] [phone]  Patient Care Team: Glean Hess, MD as PCP - General (Internal Medicine) Dr. Pasty Arch (Psychiatry)   Name of the patient: Jessica Howell  [id number]  10-Jan-1935   Date of visit: 11/06/19  Diagnosis-iron deficiency anemia of unclear etiology  Chief complaint/ Reason for visit-post hospital discharge follow-up  Heme/Onc history: Patient is a 85 year old female with a past medical history significant for coronary artery disease, hypertension, COPD, depression among other medical problems.  She has been referred to [place] for iron deficiency anemia.  Most recent CBC from 10/09/2019 showed white count of 6.4, H&H of 7.2/23.6 with an MCV of 73 and a platelet count which was inaccurate due to aggregation of platelets.  Her prior hemoglobin from February was normal at 14.6.   Plan was to give her 2 doses of Feraheme for iron deficiency.  However patient had possible anxiety/panic attack after few mL of Feraheme.  Her systolic blood pressure went up to the 240s and patient complained of intense back pain and was sent to the ER.  She was then admitted to the hospital and underwent inpatient work-up including EGD colonoscopy and capsule study which did not show any evidence of active bleeding.  She received 2 doses of Venofer 300 mg and 1 unit of PRBC in the hospital.  Xarelto is currently on hold and okay per cardiology to be discontinued  Interval history-patient is here with her daughter today.  Reports doing better since her hospital stay.  Denies any blood loss in her stool or urine.  Denies any dark melanotic stools  ECOG PS- 2 Pain scale- 0   Review of systems- Review of Systems  Constitutional: Positive for malaise/fatigue. Negative for chills, fever and weight loss.  HENT: Negative for congestion, ear discharge and nosebleeds.   Eyes: Negative for blurred vision.    Respiratory: Negative for cough, hemoptysis, sputum production, shortness of breath and wheezing.   Cardiovascular: Negative for chest pain, palpitations, orthopnea and claudication.  Gastrointestinal: Negative for abdominal pain, blood in stool, constipation, diarrhea, heartburn, melena, nausea and vomiting.  Genitourinary: Negative for dysuria, flank pain, frequency, hematuria and urgency.  Musculoskeletal: Negative for back pain, joint pain and myalgias.  Skin: Negative for rash.  Neurological: Negative for dizziness, tingling, focal weakness, seizures, weakness and headaches.  Endo/Heme/Allergies: Does not bruise/bleed easily.  Psychiatric/Behavioral: Negative for depression and suicidal ideas. The patient does not have insomnia.      Allergies  Allergen Reactions  . Penicillins Anaphylaxis, Rash and Other (See Comments)  . Azithromycin Other (See Comments)    abd pain   . Gemfibrozil Nausea And Vomiting and Rash       . Solifenacin     Mild urinary retention  . Statins Nausea And Vomiting and Rash    Stomach pain       Past Medical History:  Diagnosis Date  . A-fib ([place])   . Allergy   . Anemia   . CHF (congestive heart failure) ([place])   . Chronic anticoagulation, recent, Xarelto  10/18/2019  . COPD (chronic obstructive pulmonary disease) ([place])   . Depression   . Feeling grief   . GERD (gastroesophageal reflux disease)   . Hyperlipidemia   . Hypertension   . Hypertensive urgency 10/18/2019  . Hypokalemia 09/06/2016   Last Assessment & Plan:  Formatting of this note is different from the original. Will continue to monitor; ordered labs as below. Lab  Results  Component Value Date   K 3.2 (L) 04/12/2016   K 3.5 04/10/2016   K 4.2 04/09/2016   K 4.4 11/21/2013   K 4.4 01/03/2013  . Myocardial infarction ([place])   . Osteoarthritis   . Post-nasal drip 11/10/2015   Formatting of this note might be different from the original. Overview:   (Working Diagnosis)  Last Assessment &  Plan:  Formatting of this note might be different from the original. Continue OTC flonase 2 sprays daily Likely contributing to cough.  Will see if flonase helps with cough and PND  . Right wrist pain 06/20/2015   Last Assessment & Plan:  Formatting of this note might be different from the original. Significant edema, and some grip and extension weakness, and persistent severe pain. She was not able to tolerate splint Reviewed xr of her recent ED visit No fractures identified (except old fracture on radial head)  +++ snuff box tenderness persists [facility]erred to [facility] Information provide  . Vitamin D deficiency      Past Surgical History:  Procedure Laterality Date  . ABDOMINAL HYSTERECTOMY    . BREAST SURGERY    . COLONOSCOPY WITH PROPOFOL N/A 10/21/2019   Procedure: COLONOSCOPY WITH PROPOFOL;  Surgeon: Jonathon Bellows, MD;  Location: [facility] ENDOSCOPY;  Service: Gastroenterology;  Laterality: N/A;  . ESOPHAGOGASTRODUODENOSCOPY (EGD) WITH PROPOFOL N/A 10/20/2019   Procedure: ESOPHAGOGASTRODUODENOSCOPY (EGD) WITH PROPOFOL;  Surgeon: Lin Landsman, MD;  Location: [facility] ENDOSCOPY;  Service: Gastroenterology;  Laterality: N/A;  . FOREARM SURGERY    . GIVENS CAPSULE STUDY N/A 10/21/2019   Procedure: GIVENS CAPSULE STUDY;  Surgeon: Jonathon Bellows, MD;  Location: [facility] ENDOSCOPY;  Service: Gastroenterology;  Laterality: N/A;    Social History   Socioeconomic History  . Marital status: Widowed    Spouse name: Not on file  . Number of children: 1  . Years of education: Not on file  . Highest education level: Not on file  Occupational History  . Occupation: retired  Tobacco Use  . Smoking status: Former Smoker    Packs/day: 0.50    Years: 45.00    Pack years: 22.50    Types: Cigarettes    Quit date: 06/10/2004    Years since quitting: 15.4  . Smokeless tobacco: Never Used  Vaping Use  . Vaping Use: Never used  Substance and Sexual Activity  . Alcohol use: Never  . Drug use:  Not Currently  . Sexual activity: Not Currently  Other Topics Concern  . Not on file  Social History Narrative   Living alone. Husband passed away in 12/08/2018. Daughter checking in on her and great grandson checks in also.    Social Determinants of Health   Financial Resource Strain: Low Risk   . Difficulty of Paying Living Expenses: Not very hard  Food Insecurity: No Food Insecurity  . Worried About Charity fundraiser in the Last Year: Never true  . Ran Out of Food in the Last Year: Never true  Transportation Needs: No Transportation Needs  . Lack of Transportation (Medical): No  . Lack of Transportation (Non-Medical): No  Physical Activity: Inactive  . Days of Exercise per Week: 0 days  . Minutes of Exercise per Session: 0 min  Stress: Stress Concern Present  . Feeling of Stress : Very much  Social Connections: Unknown  . Frequency of Communication with Friends and Family: Patient refused  . Frequency of Social Gatherings with Friends and Family: Patient refused  .  Attends Religious Services: Patient refused  . Active Member of Clubs or Organizations: Patient refused  . Attends Archivist Meetings: Patient refused  . Marital Status: Widowed  Intimate Partner Violence: Not At Risk  . Fear of Current or Ex-Partner: No  . Emotionally Abused: No  . Physically Abused: No  . Sexually Abused: No    Family History  Problem Relation Age of Onset  . Diabetes Mother   . Hypertension Mother   . Cancer Father        esoph.   Marland Kitchen COPD Sister   . Heart disease Brother   . Early death Son      Current Outpatient Medications:  .  albuterol (VENTOLIN HFA) 108 (90 Base) MCG/ACT inhaler, every 6 (six) hours as needed. , Disp: , Rfl:  .  ALPRAZolam (XANAX) 0.5 MG tablet, Take 0.5 mg by mouth 3 (three) times daily as needed. , Disp: , Rfl:  .  Cholecalciferol (VITAMIN D) 125 MCG (5000 UT) CAPS, Take 1 capsule by mouth daily. , Disp: , Rfl:  .  ferrous gluconate (IRON 27) 240  (27 FE) MG tablet, Take 1 tablet (240 mg total) by mouth daily., Disp: 30 tablet, Rfl: 2 .  furosemide (LASIX) 40 MG tablet, Take 1 tablet (40 mg total) by mouth 2 (two) times daily. Early morning and early afternoon., Disp: 180 tablet, Rfl: 1 .  gabapentin (NEURONTIN) 100 MG capsule, Take 1 capsule (100 mg total) by mouth 2 (two) times daily. For neuropathy pain., Disp: 180 capsule, Rfl: 1 .  Melatonin 5 MG CHEW, Chew by mouth. , Disp: , Rfl:  .  metoprolol (TOPROL-XL) 200 MG 24 hr tablet, Take 200 mg by mouth daily., Disp: , Rfl:  .  montelukast (SINGULAIR) 10 MG tablet, Take by mouth at bedtime. , Disp: , Rfl:  .  nitroGLYCERIN (NITROSTAT) 0.4 MG SL tablet, Place 1 tablet (0.4 mg total) under the tongue every 5 (five) minutes as needed for chest pain., Disp: 30 tablet, Rfl: 1 .  omeprazole (PRILOSEC) 20 MG capsule, TAKE 1 CAPSULE (20 MG TOTAL) BY MOUTH 2 (TWO) TIMES DAILY BEFORE A MEAL., Disp: 180 capsule, Rfl: 1 .  potassium chloride (KLOR-CON) 10 MEQ tablet, Take 10 mEq by mouth 2 (two) times daily. , Disp: , Rfl:  .  ramipril (ALTACE) 10 MG capsule, Take 10 mg by mouth 2 (two) times daily. , Disp: , Rfl:  .  temazepam (RESTORIL) 30 MG capsule, Take 30 mg by mouth at bedtime as needed. , Disp: , Rfl:  .  traMADol (ULTRAM) 50 MG tablet, Take by mouth every 6 (six) hours as needed., Disp: , Rfl:  .  VITAMIN D PO, Take 400 Units by mouth daily., Disp: , Rfl:  .  vitamin E 180 MG (400 UNITS) capsule, Take 400 Units by mouth daily. , Disp: , Rfl:  .  Fluticasone-Salmeterol (ADVAIR DISKUS) 250-50 MCG/DOSE AEPB, Inhale 1 puff into the lungs 2 (two) times daily. , Disp: , Rfl:   Physical exam:  Vitals:   11/06/19 1132  BP: (!) 198/89  Pulse: 70  Resp: 16  Temp: (!) 96.7 F (35.9 C)  TempSrc: Tympanic  SpO2: 98%  Weight: 139 lb 4.8 oz (63.2 kg)   Physical Exam Constitutional:      General: She is not in acute distress. Pulmonary:     Effort: Pulmonary effort is normal.  Skin:     General: Skin is warm and dry.  Neurological:  Mental Status: She is alert and oriented to person, place, and time.      CMP Latest Ref Rng & Units 11/05/2019  Glucose 65 - 99 mg/dL 96  BUN 8 - 27 mg/dL 11  Creatinine 0.57 - 1.00 mg/dL 1.16(H)  Sodium 134 - 144 mmol/L 138  Potassium 3.5 - 5.2 mmol/L 4.0  Chloride 96 - 106 mmol/L 99  CO2 20 - 29 mmol/L 27  Calcium 8.7 - 10.3 mg/dL 10.2  Total Protein 6.5 - 8.1 g/dL -  Total Bilirubin 0.3 - 1.2 mg/dL -  Alkaline Phos 38 - 126 U/L -  AST 15 - 41 U/L -  ALT 0 - 44 U/L -   CBC Latest Ref Rng & Units 11/06/2019  WBC 4.0 - 10.5 K/uL 6.3  Hemoglobin 12.0 - 15.0 g/dL 11.2(L)  Hematocrit 36 - 46 % 36.4  Platelets 150 - 400 K/uL 399    No images are attached to the encounter.  DG Chest 2 View  Result Date: 10/17/2019 CLINICAL DATA:  Shortness of breath EXAM: CHEST - 2 VIEW COMPARISON:  CT chest 10/16/2019 FINDINGS: There is mild bilateral interstitial thickening. There is no focal consolidation. There is no pleural effusion or pneumothorax. There is cardiomegaly. There is thoracic aortic atherosclerosis. There is a healed right humeral neck fracture. There is a healed left humeral neck fracture transfixed with a metallic sideplate and interlocking screws. IMPRESSION: No active cardiopulmonary disease. Electronically Signed   By: Kathreen Devoid   On: 10/17/2019 19:09   CT Angio Chest/Abd/Pel for Dissection W and/or Wo Contrast  Result Date: 10/16/2019 CLINICAL DATA:  85 year old female with chest pain. Concern for acute aortic syndrome. EXAM: CT ANGIOGRAPHY CHEST, ABDOMEN AND PELVIS TECHNIQUE: Non-contrast CT of the chest was initially obtained. Multidetector CT imaging through the chest, abdomen and pelvis was performed using the standard protocol during bolus administration of intravenous contrast. Multiplanar reconstructed images and MIPs were obtained and reviewed to evaluate the vascular anatomy. CONTRAST:  39mL OMNIPAQUE IOHEXOL 350  MG/ML SOLN COMPARISON:  None. FINDINGS: CTA CHEST FINDINGS Cardiovascular: There is mild cardiomegaly. No pericardial effusion. Advanced 3 vessel coronary vascular calcification. There is advanced atherosclerotic calcification of the thoracic aorta. No aneurysmal dilatation or dissection. The origins of the great vessels of the aortic arch appear patent as visualized. There is mild dilatation of main pulmonary trunk suggestive of pulmonary hypertension. Clinical correlation is recommended. Evaluation of the pulmonary arteries is limited due to suboptimal opacification and timing of the contrast. The central pulmonary arteries appear patent as visualized. Mediastinum/Nodes: Mildly enlarged right hilar lymph node measures 16 cm in short axis. No mediastinal adenopathy. The esophagus is grossly unremarkable. There is a 2 cm heterogeneous right thyroid nodule. Recommend thyroid US (ref: J Am Coll Radiol. 2015 Feb;12(2): 143-50).No mediastinal fluid collection. Lungs/Pleura: There is a 1 cm nodule along the superior aspect of the right major fissure. No focal consolidation, pleural effusion, pneumothorax. The central airways are patent. Musculoskeletal: Mild degenerative changes of the spine as well as arthritic changes of the shoulders. No acute osseous pathology. Review of the MIP images confirms the above findings. CTA ABDOMEN AND PELVIS FINDINGS VASCULAR Aorta: Advanced atherosclerotic calcification. No dissection or aneurysm. Celiac: There is atherosclerotic calcification of the origin of the celiac axis. The celiac artery and its main branches are patent. SMA: Atherosclerotic calcification of the proximal SMA. The SMA is patent. Renals: Atherosclerotic calcification of the renal arteries. The renal arteries remain patent. And accessory left renal artery noted. IMA:  The IMA is patent. Inflow: Advanced atherosclerotic calcification of the iliac arteries. The iliac arteries remain patent. Veins: No obvious venous  abnormality within the limitations of this arterial phase study. Review of the MIP images confirms the above findings. NON-VASCULAR No intra-abdominal free air or free fluid. Hepatobiliary: No focal liver abnormality is seen. No gallstones, gallbladder wall thickening, or biliary dilatation. Pancreas: Unremarkable. No pancreatic ductal dilatation or surrounding inflammatory changes. Spleen: Normal in size without focal abnormality. Adrenals/Urinary Tract: The adrenal glands unremarkable. There is no hydronephrosis on either side. There is thank more atrophy and cortical scarring of the inferior pole of the right kidney. Several left renal cysts measure up to 2.5 cm. Subcentimeter right renal hypodense lesion is too small to characterize. This can be better evaluated with ultrasound on a nonemergent/outpatient basis. The visualized ureters and urinary bladder appear unremarkable. Stomach/Bowel: There is no bowel obstruction or active inflammation. The appendix is normal. Lymphatic: No adenopathy. Reproductive: Hysterectomy. Other: A 3.7 x 2.1 cm chronic appearing complex collection in the subcutaneous soft tissues of the left gluteal region. Additional fluid collection also noted more inferiorly. There is subcutaneous dystrophic calcification. A partially visualized larger fluid collection measuring at least 6.5 cm in the subcutaneous soft tissues of the right buttock. Musculoskeletal: Osteopenia. No acute osseous pathology. Review of the MIP images confirms the above findings. IMPRESSION: 1. No acute intrathoracic, abdominal, or pelvic pathology. No CT evidence of aortic dissection or aneurysm. 2. A 1 cm right upper lobe nodule along the major fissure. Consider one of the following in 3 months for both low-risk and high-risk individuals: (a) repeat chest CT, (b) follow-up PET-CT, or (c) tissue sampling. This recommendation follows the consensus statement: Guidelines for Management of Incidental Pulmonary Nodules  Detected on CT Images: From the Fleischner Society 2017; Radiology 2017; 284:228-243. 3. Mild cardiomegaly with advanced 3 vessel coronary vascular calcification. 4. Mildly enlarged right hilar lymph node, nonspecific. Clinical correlation is recommended. 5. A 2 cm heterogeneous right thyroid nodule. Recommend thyroid US (ref: J Am Coll Radiol. 6. Aortic Atherosclerosis (ICD10-I70.0). Electronically Signed   By: Anner Crete M.D.   On: 10/16/2019 17:09     Assessment and plan- Patient is a 85 y.o. female with iron deficiency anemia here for post hospital discharge follow-up  Iron deficiency anemia: Patient's hemoglobin has improved significantly to 11.2 today from 8.2 after 1 unit of PRBC transfusion and 2 doses of Venofer as an inpatient.  Ideally she needs 1 more dose of Venofer to give close to 1 g of IV iron.  However her systolic blood pressure is elevated at 198.  She had a infusion-like reaction last time when her blood pressure went up to over 240 and patient complained of intense back pain after her iron infusion and had to be hospitalized.  I will therefore hold off on giving her IV iron at this time.  She will continue to take oral iron  Xarelto will be continued to be on hold  Patient will also take oral B12 1000 mcg daily since her B12 levels were mildly low at 281.  Patient's daughter states that she will be getting blood work with Dr. Army Melia in 3 months and have asked her to make sure that CBC ferritin and iron studies are checked in 3 months.  I will see her back in 6 months with CBC ferritin and iron studies and B12   Visit Diagnosis 1. Iron deficiency anemia, unspecified iron deficiency anemia type   2. B12 deficiency  Dr. Randa Evens, MD, MPH [facility] at [facility] [phone] 11/06/2019 1:37 PM

## 2019-11-08 ENCOUNTER — Inpatient Hospital Stay: Payer: Medicare HMO

## 2019-11-09 ENCOUNTER — Ambulatory Visit: Payer: Medicare HMO

## 2019-11-12 ENCOUNTER — Other Ambulatory Visit: Payer: Self-pay

## 2019-11-12 ENCOUNTER — Ambulatory Visit
Admission: RE | Admit: 2019-11-12 | Discharge: 2019-11-12 | Disposition: A | Discharge status: Home / Self Care | Payer: Medicare HMO | Source: Ambulatory Visit | Attending: Internal Medicine | Admitting: Internal Medicine

## 2019-11-12 DIAGNOSIS — E041 Nontoxic single thyroid nodule: Secondary | ICD-10-CM | POA: Insufficient documentation

## 2019-11-13 ENCOUNTER — Other Ambulatory Visit: Payer: Self-pay

## 2019-11-13 DIAGNOSIS — E041 Nontoxic single thyroid nodule: Secondary | ICD-10-CM

## 2019-12-07 DIAGNOSIS — H903 Sensorineural hearing loss, bilateral: Secondary | ICD-10-CM | POA: Diagnosis not present

## 2019-12-07 DIAGNOSIS — E041 Nontoxic single thyroid nodule: Secondary | ICD-10-CM | POA: Diagnosis not present

## 2019-12-07 DIAGNOSIS — K219 Gastro-esophageal reflux disease without esophagitis: Secondary | ICD-10-CM | POA: Diagnosis not present

## 2019-12-11 ENCOUNTER — Other Ambulatory Visit: Payer: Self-pay | Admitting: Otolaryngology

## 2019-12-11 DIAGNOSIS — E041 Nontoxic single thyroid nodule: Secondary | ICD-10-CM

## 2019-12-17 ENCOUNTER — Ambulatory Visit
Admission: RE | Admit: 2019-12-17 | Discharge: 2019-12-17 | Disposition: A | Discharge status: Home / Self Care | Payer: Medicare HMO | Source: Ambulatory Visit | Attending: Otolaryngology | Admitting: Otolaryngology

## 2019-12-17 ENCOUNTER — Other Ambulatory Visit: Payer: Self-pay

## 2019-12-17 DIAGNOSIS — E041 Nontoxic single thyroid nodule: Secondary | ICD-10-CM | POA: Insufficient documentation

## 2019-12-17 NOTE — Procedures (Signed)
Successful US guided FNA x 6 (R)inf thyroid nodule No complications.  Ascencion Dike PA-C Interventional Radiology 12/17/2019 1:41 PM

## 2019-12-18 ENCOUNTER — Ambulatory Visit: Payer: Medicare HMO | Admitting: Oncology

## 2019-12-18 ENCOUNTER — Other Ambulatory Visit: Payer: Medicare HMO

## 2019-12-18 LAB — CYTOLOGY - NON PAP

## 2019-12-25 DIAGNOSIS — I10 Essential (primary) hypertension: Secondary | ICD-10-CM | POA: Diagnosis not present

## 2019-12-25 DIAGNOSIS — I25118 Atherosclerotic heart disease of native coronary artery with other forms of angina pectoris: Secondary | ICD-10-CM | POA: Diagnosis not present

## 2019-12-25 DIAGNOSIS — E782 Mixed hyperlipidemia: Secondary | ICD-10-CM | POA: Diagnosis not present

## 2019-12-25 DIAGNOSIS — I480 Paroxysmal atrial fibrillation: Secondary | ICD-10-CM | POA: Diagnosis not present

## 2020-01-09 ENCOUNTER — Other Ambulatory Visit: Payer: Self-pay

## 2020-01-09 ENCOUNTER — Encounter: Payer: Self-pay | Admitting: Internal Medicine

## 2020-01-09 MED ORDER — FLUTICASONE-SALMETEROL 250-50 MCG/DOSE IN AEPB: 1.0000 | INHALATION_SPRAY | Freq: Two times a day (BID) | RESPIRATORY_TRACT | 6 refills | Status: DC

## 2020-01-22 DIAGNOSIS — I480 Paroxysmal atrial fibrillation: Secondary | ICD-10-CM | POA: Diagnosis not present

## 2020-01-22 DIAGNOSIS — Z23 Encounter for immunization: Secondary | ICD-10-CM | POA: Diagnosis not present

## 2020-01-22 DIAGNOSIS — I10 Essential (primary) hypertension: Secondary | ICD-10-CM | POA: Diagnosis not present

## 2020-01-22 DIAGNOSIS — I25118 Atherosclerotic heart disease of native coronary artery with other forms of angina pectoris: Secondary | ICD-10-CM | POA: Diagnosis not present

## 2020-01-25 HISTORY — PX: OTHER SURGICAL HISTORY: SHX169

## 2020-02-08 ENCOUNTER — Ambulatory Visit: Payer: Medicare HMO | Admitting: Internal Medicine

## 2020-02-11 ENCOUNTER — Ambulatory Visit: Payer: Medicare HMO | Admitting: Internal Medicine

## 2020-02-13 ENCOUNTER — Encounter: Payer: Self-pay | Admitting: Internal Medicine

## 2020-02-13 ENCOUNTER — Ambulatory Visit (INDEPENDENT_AMBULATORY_CARE_PROVIDER_SITE_OTHER): Payer: Medicare HMO | Admitting: Internal Medicine

## 2020-02-13 ENCOUNTER — Other Ambulatory Visit: Payer: Self-pay

## 2020-02-13 VITALS — BP 136/84 | HR 76 | Temp 98.5°F | Ht 60.0 in | Wt 129.0 lb

## 2020-02-13 DIAGNOSIS — M4856XA Collapsed vertebra, not elsewhere classified, lumbar region, initial encounter for fracture: Secondary | ICD-10-CM | POA: Diagnosis not present

## 2020-02-13 DIAGNOSIS — S79912A Unspecified injury of left hip, initial encounter: Secondary | ICD-10-CM | POA: Diagnosis not present

## 2020-02-13 DIAGNOSIS — Z23 Encounter for immunization: Secondary | ICD-10-CM

## 2020-02-13 DIAGNOSIS — W19XXXA Unspecified fall, initial encounter: Secondary | ICD-10-CM | POA: Diagnosis not present

## 2020-02-13 DIAGNOSIS — J449 Chronic obstructive pulmonary disease, unspecified: Secondary | ICD-10-CM | POA: Diagnosis not present

## 2020-02-13 DIAGNOSIS — R918 Other nonspecific abnormal finding of lung field: Secondary | ICD-10-CM | POA: Diagnosis not present

## 2020-02-13 DIAGNOSIS — R634 Abnormal weight loss: Secondary | ICD-10-CM

## 2020-02-13 DIAGNOSIS — S299XXA Unspecified injury of thorax, initial encounter: Secondary | ICD-10-CM | POA: Diagnosis not present

## 2020-02-13 DIAGNOSIS — M85852 Other specified disorders of bone density and structure, left thigh: Secondary | ICD-10-CM | POA: Diagnosis not present

## 2020-02-13 DIAGNOSIS — F322 Major depressive disorder, single episode, severe without psychotic features: Secondary | ICD-10-CM

## 2020-02-13 DIAGNOSIS — S72102A Unspecified trochanteric fracture of left femur, initial encounter for closed fracture: Secondary | ICD-10-CM | POA: Diagnosis not present

## 2020-02-13 DIAGNOSIS — Z20822 Contact with and (suspected) exposure to covid-19: Secondary | ICD-10-CM | POA: Diagnosis not present

## 2020-02-13 DIAGNOSIS — I10 Essential (primary) hypertension: Secondary | ICD-10-CM

## 2020-02-13 DIAGNOSIS — M25552 Pain in left hip: Secondary | ICD-10-CM | POA: Diagnosis not present

## 2020-02-13 DIAGNOSIS — R29890 Loss of height: Secondary | ICD-10-CM | POA: Diagnosis not present

## 2020-02-13 DIAGNOSIS — S52122A Displaced fracture of head of left radius, initial encounter for closed fracture: Secondary | ICD-10-CM | POA: Diagnosis not present

## 2020-02-13 DIAGNOSIS — M79652 Pain in left thigh: Secondary | ICD-10-CM | POA: Diagnosis not present

## 2020-02-13 DIAGNOSIS — M5134 Other intervertebral disc degeneration, thoracic region: Secondary | ICD-10-CM | POA: Diagnosis not present

## 2020-02-13 MED ORDER — ALBUTEROL SULFATE HFA 108 (90 BASE) MCG/ACT IN AERS: 2.0000 | INHALATION_SPRAY | Freq: Four times a day (QID) | RESPIRATORY_TRACT | 1 refills | Status: DC | PRN

## 2020-02-13 NOTE — Progress Notes (Signed)
Date:  02/13/2020   Name:  Jessica Howell   DOB:  [birth year]   MRN:  [id number]   Chief Complaint: No chief complaint on file.  Hypertension This is a chronic problem. The problem is controlled. Associated symptoms include shortness of breath. Pertinent negatives include no chest pain, headaches or palpitations. Past treatments include ACE inhibitors, beta blockers and diuretics. The current treatment provides significant improvement.  Gastroesophageal Reflux She complains of coughing (phelgm) and heartburn. She reports no abdominal pain, no chest pain, no dysphagia or no wheezing. The problem occurs occasionally. Pertinent negatives include no fatigue. She has tried a PPI for the symptoms.  Depression        This is a chronic problem.The problem is unchanged.  Associated symptoms include decreased interest (and weight loss).  Associated symptoms include no fatigue, no helplessness, no hopelessness, no headaches and no suicidal ideas.     The symptoms are aggravated by social issues (since husband passed away in 11-Dec-2022 - lives alone).  Past treatments include SSRIs - Selective serotonin reuptake inhibitors.  Compliance with treatment is good.  Previous treatment provided moderate relief.   Lab Results  Component Value Date   CREATININE 1.16 (H) 11/05/2019   BUN 11 11/05/2019   NA 138 11/05/2019   K 4.0 11/05/2019   CL 99 11/05/2019   CO2 27 11/05/2019   Lab Results  Component Value Date   CHOL 227 (H) 10/09/2019   HDL 48 10/09/2019   LDLCALC 156 (H) 10/09/2019   TRIG 129 10/09/2019   CHOLHDL 4.7 (H) 10/09/2019   Lab Results  Component Value Date   TSH 2.840 10/16/2019   Lab Results  Component Value Date   HGBA1C 5.5 02/03/2018   Lab Results  Component Value Date   WBC 6.3 11/06/2019   HGB 11.2 (L) 11/06/2019   HCT 36.4 11/06/2019   MCV 76.5 (L) 11/06/2019   PLT 399 11/06/2019   Lab Results  Component Value Date   ALT 20 10/17/2019   AST 27 10/17/2019   ALKPHOS 53  10/17/2019   BILITOT 0.6 10/17/2019     Review of Systems  Constitutional: Positive for unexpected weight change (10 lbs). Negative for chills, fatigue and fever.  Respiratory: Positive for cough (phelgm) and shortness of breath. Negative for chest tightness and wheezing.   Cardiovascular: Negative for chest pain, palpitations and leg swelling.  Gastrointestinal: Positive for heartburn. Negative for abdominal pain, constipation, diarrhea and dysphagia.  Neurological: Negative for dizziness, light-headedness and headaches.  Psychiatric/Behavioral: Positive for depression. Negative for suicidal ideas.    Patient Active Problem List   Diagnosis Date Noted  . Neuropathy 11/05/2019  . Aortic atherosclerosis ([place]) 11/05/2019  . Chronic diastolic CHF (congestive heart failure) ([place]) 10/18/2019  . Hyponatremia 10/18/2019  . Pulmonary nodule 1 cm or greater in diameter 10/18/2019  . Thyroid nodule greater than or equal to 1.5 cm in diameter incidentally noted on imaging study 10/18/2019  . CKD (chronic kidney disease) stage 3, GFR 30-59 ml/min (HCC) 10/18/2019  . Microcytic anemia 10/16/2019  . Acquired thrombophilia ([place]) 10/09/2019  . Senile osteoporosis 06/11/2019  . Current severe episode of major depressive disorder without psychotic features ([place]) 06/11/2019  . Myalgia due to statin 06/11/2019  . Esophageal dysphagia 05/03/2018  . Iron deficiency anemia 03/18/2018  . Other age-related cataract 05/31/2016  . Paroxysmal atrial fibrillation ([place]) 04/15/2016  . Other seborrheic keratosis 01/03/2013  . Coronary atherosclerosis of native coronary artery 01/03/2013  . Vitamin D deficiency 12/19/2012  .  Mixed hyperlipidemia 12/19/2012  . Essential (primary) hypertension 12/19/2012  . Chronic obstructive pulmonary disease, unspecified ([place]) 12/19/2012    Allergies  Allergen Reactions  . Penicillins Anaphylaxis, Rash and Other (See Comments)  . Azithromycin Other (See Comments)    abd  pain   . Gemfibrozil Nausea And Vomiting and Rash       . Solifenacin     Mild urinary retention  . Statins Nausea And Vomiting and Rash    Stomach pain      Past Surgical History:  Procedure Laterality Date  . ABDOMINAL HYSTERECTOMY    . BREAST SURGERY    . COLONOSCOPY WITH PROPOFOL N/A 10/21/2019   Procedure: COLONOSCOPY WITH PROPOFOL;  Surgeon: Jonathon Bellows, MD;  Location[facility] ENDOSCOPY;  Service: Gastroenterology;  Laterality: N/A;  . ESOPHAGOGASTRODUODENOSCOPY (EGD) WITH PROPOFOL N/A 10/20/2019   Procedure: ESOPHAGOGASTRODUODENOSCOPY (EGD) WITH PROPOFOL;  Surgeon: Lin Landsman, MD;  Location: [facility] ENDOSCOPY;  Service: Gastroenterology;  Laterality: N/A;  . FOREARM SURGERY    . GIVENS CAPSULE STUDY N/A 10/21/2019   Procedure: GIVENS CAPSULE STUDY;  Surgeon: Jonathon Bellows, MD;  Location: [facility] ENDOSCOPY;  Service: Gastroenterology;  Laterality: N/A;    Social History   Tobacco Use  . Smoking status: Former Smoker    Packs/day: 0.50    Years: 45.00    Pack years: 22.50    Types: Cigarettes    Quit date: 06/10/2004    Years since quitting: 15.6  . Smokeless tobacco: Never Used  Vaping Use  . Vaping Use: Never used  Substance Use Topics  . Alcohol use: Never  . Drug use: Not Currently     Medication list has been reviewed and updated.  Current Meds  Medication Sig  . albuterol (VENTOLIN HFA) 108 (90 Base) MCG/ACT inhaler every 6 (six) hours as needed.   . Cholecalciferol (VITAMIN D) 125 MCG (5000 UT) CAPS Take 1 capsule by mouth daily.   . cloNIDine (CATAPRES) 0.2 MG tablet Take 0.1 mg by mouth in the morning and at bedtime.  Marland Kitchen escitalopram (LEXAPRO) 20 MG tablet Take 20 mg by mouth daily.  . Fluticasone-Salmeterol (ADVAIR DISKUS) 250-50 MCG/DOSE AEPB Inhale 1 puff into the lungs 2 (two) times daily.  . furosemide (LASIX) 40 MG tablet Take 1 tablet (40 mg total) by mouth 2 (two) times daily. Early morning and early afternoon.  . gabapentin (NEURONTIN) 100 MG  capsule Take 1 capsule (100 mg total) by mouth 2 (two) times daily. For neuropathy pain.  Marland Kitchen lisinopril (ZESTRIL) 20 MG tablet Take 20 mg by mouth daily.  . Melatonin 5 MG CHEW Chew by mouth.   . metoprolol (TOPROL-XL) 200 MG 24 hr tablet Take 200 mg by mouth daily.  . montelukast (SINGULAIR) 10 MG tablet Take by mouth at bedtime.   . nitroGLYCERIN (NITROSTAT) 0.4 MG SL tablet Place 1 tablet (0.4 mg total) under the tongue every 5 (five) minutes as needed for chest pain.  Marland Kitchen omeprazole (PRILOSEC) 20 MG capsule TAKE 1 CAPSULE (20 MG TOTAL) BY MOUTH 2 (TWO) TIMES DAILY BEFORE A MEAL.  Marland Kitchen potassium chloride (KLOR-CON) 10 MEQ tablet Take 10 mEq by mouth 2 (two) times daily.   . ramipril (ALTACE) 10 MG capsule Take 10 mg by mouth 2 (two) times daily.   . temazepam (RESTORIL) 30 MG capsule Take 30 mg by mouth at bedtime as needed.   . traMADol (ULTRAM) 50 MG tablet Take by mouth every 6 (six) hours as needed.  Marland Kitchen VITAMIN D PO Take 400  Units by mouth daily.  . vitamin E 180 MG (400 UNITS) capsule Take 400 Units by mouth daily.     PHQ 2/9 Scores 02/13/2020 11/05/2019 10/09/2019 09/26/2019  PHQ - 2 Score 4 3 2 6   PHQ- 9 Score 5 7 9 18     GAD 7 : Generalized Anxiety Score 02/13/2020 11/05/2019 10/09/2019  Nervous, Anxious, on Edge 1 1 2   Control/stop worrying 1 2 2   Worry too much - different things 0 2 2  Trouble relaxing 0 2 2  Restless 0 0 1  Easily annoyed or irritable 0 0 1  Afraid - awful might happen 0 0 2  Total GAD 7 Score 2 7 12   Anxiety Difficulty Not difficult at all Not difficult at all Not difficult at all    BP Readings from Last 3 Encounters:  02/13/20 136/84  11/06/19 (!) 198/89  11/05/19 122/70    Physical Exam Vitals and nursing note reviewed.  Constitutional:      General: She is not in acute distress.    Appearance: Normal appearance. She is well-developed.  HENT:     Head: Normocephalic and atraumatic.  Cardiovascular:     Rate and Rhythm: Normal rate and regular  rhythm.     Pulses: Normal pulses.     Heart sounds: No murmur heard.   Pulmonary:     Effort: Pulmonary effort is normal. No respiratory distress.     Breath sounds: No wheezing or rhonchi.  Musculoskeletal:     Cervical back: Normal range of motion.     Right lower leg: No edema.     Left lower leg: No edema.  Lymphadenopathy:     Cervical: No cervical adenopathy.  Skin:    General: Skin is warm and dry.     Capillary Refill: Capillary refill takes less than 2 seconds.     Findings: No rash.  Neurological:     General: No focal deficit present.     Mental Status: She is alert and oriented to person, place, and time.  Psychiatric:        Mood and Affect: Mood normal.     Wt Readings from Last 3 Encounters:  02/13/20 129 lb (58.5 kg)  11/06/19 139 lb 4.8 oz (63.2 kg)  11/05/19 139 lb (63 kg)    BP 136/84   Pulse 76   Temp 98.5 F (36.9 C) (Oral)   Ht 5' (1.524 m)   Wt 129 lb (58.5 kg)   SpO2 96%   BMI 25.19 kg/m   Assessment and Plan: 1. Essential (primary) hypertension Clinically stable exam with well controlled BP on multiple medications. Tolerating medications without side effects at this time. Followed by Cardiology Pt to continue current regimen and low sodium diet;  2. Chronic obstructive pulmonary disease, unspecified COPD type (HCC) Stable - on Advair and singulair - albuterol (VENTOLIN HFA) 108 (90 Base) MCG/ACT inhaler; Inhale 2 puffs into the lungs every 6 (six) hours as needed.  Dispense: 54 g; Refill: 1  3. Current severe episode of major depressive disorder without psychotic features without prior episode ([place]) Clinically stable on current regimen with good control of symptoms, No SI or HI. Will continue current therapy.  4. Weight loss, unintentional Discussed need for balanced diet with protein/grains/vegetables/fruit Follow up in several months   Partially dictated using Editor, commissioning. Any errors are unintentional.  Halina Maidens,  MD [facility]  02/13/2020

## 2020-02-14 DIAGNOSIS — W19XXXA Unspecified fall, initial encounter: Secondary | ICD-10-CM | POA: Diagnosis not present

## 2020-02-14 DIAGNOSIS — S0990XA Unspecified injury of head, initial encounter: Secondary | ICD-10-CM | POA: Diagnosis not present

## 2020-02-14 DIAGNOSIS — R4189 Other symptoms and signs involving cognitive functions and awareness: Secondary | ICD-10-CM | POA: Diagnosis not present

## 2020-02-14 DIAGNOSIS — N183 Chronic kidney disease, stage 3 unspecified: Secondary | ICD-10-CM | POA: Diagnosis not present

## 2020-02-14 DIAGNOSIS — F0280 Dementia in other diseases classified elsewhere without behavioral disturbance: Secondary | ICD-10-CM | POA: Diagnosis not present

## 2020-02-14 DIAGNOSIS — M80052A Age-related osteoporosis with current pathological fracture, left femur, initial encounter for fracture: Secondary | ICD-10-CM | POA: Diagnosis not present

## 2020-02-14 DIAGNOSIS — I252 Old myocardial infarction: Secondary | ICD-10-CM | POA: Diagnosis not present

## 2020-02-14 DIAGNOSIS — M1610 Unilateral primary osteoarthritis, unspecified hip: Secondary | ICD-10-CM | POA: Diagnosis not present

## 2020-02-14 DIAGNOSIS — I130 Hypertensive heart and chronic kidney disease with heart failure and stage 1 through stage 4 chronic kidney disease, or unspecified chronic kidney disease: Secondary | ICD-10-CM | POA: Diagnosis not present

## 2020-02-14 DIAGNOSIS — G309 Alzheimer's disease, unspecified: Secondary | ICD-10-CM | POA: Diagnosis not present

## 2020-02-14 DIAGNOSIS — M25552 Pain in left hip: Secondary | ICD-10-CM | POA: Diagnosis not present

## 2020-02-14 DIAGNOSIS — I480 Paroxysmal atrial fibrillation: Secondary | ICD-10-CM | POA: Diagnosis not present

## 2020-02-14 DIAGNOSIS — R279 Unspecified lack of coordination: Secondary | ICD-10-CM | POA: Diagnosis not present

## 2020-02-14 DIAGNOSIS — I4891 Unspecified atrial fibrillation: Secondary | ICD-10-CM | POA: Diagnosis not present

## 2020-02-14 DIAGNOSIS — R5381 Other malaise: Secondary | ICD-10-CM | POA: Diagnosis not present

## 2020-02-14 DIAGNOSIS — Z20822 Contact with and (suspected) exposure to covid-19: Secondary | ICD-10-CM | POA: Diagnosis not present

## 2020-02-14 DIAGNOSIS — F329 Major depressive disorder, single episode, unspecified: Secondary | ICD-10-CM | POA: Diagnosis not present

## 2020-02-14 DIAGNOSIS — M869 Osteomyelitis, unspecified: Secondary | ICD-10-CM | POA: Diagnosis not present

## 2020-02-14 DIAGNOSIS — F419 Anxiety disorder, unspecified: Secondary | ICD-10-CM | POA: Diagnosis not present

## 2020-02-14 DIAGNOSIS — R2241 Localized swelling, mass and lump, right lower limb: Secondary | ICD-10-CM | POA: Diagnosis not present

## 2020-02-14 DIAGNOSIS — I2510 Atherosclerotic heart disease of native coronary artery without angina pectoris: Secondary | ICD-10-CM | POA: Diagnosis not present

## 2020-02-14 DIAGNOSIS — J449 Chronic obstructive pulmonary disease, unspecified: Secondary | ICD-10-CM | POA: Diagnosis not present

## 2020-02-14 DIAGNOSIS — S72142A Displaced intertrochanteric fracture of left femur, initial encounter for closed fracture: Secondary | ICD-10-CM | POA: Diagnosis not present

## 2020-02-14 DIAGNOSIS — S72145A Nondisplaced intertrochanteric fracture of left femur, initial encounter for closed fracture: Secondary | ICD-10-CM | POA: Diagnosis not present

## 2020-02-14 DIAGNOSIS — I5032 Chronic diastolic (congestive) heart failure: Secondary | ICD-10-CM | POA: Diagnosis not present

## 2020-02-14 DIAGNOSIS — M79652 Pain in left thigh: Secondary | ICD-10-CM | POA: Diagnosis not present

## 2020-02-14 DIAGNOSIS — G3189 Other specified degenerative diseases of nervous system: Secondary | ICD-10-CM | POA: Diagnosis not present

## 2020-02-14 DIAGNOSIS — R778 Other specified abnormalities of plasma proteins: Secondary | ICD-10-CM | POA: Diagnosis not present

## 2020-02-14 DIAGNOSIS — I5020 Unspecified systolic (congestive) heart failure: Secondary | ICD-10-CM | POA: Diagnosis not present

## 2020-02-14 DIAGNOSIS — M6281 Muscle weakness (generalized): Secondary | ICD-10-CM | POA: Diagnosis not present

## 2020-02-14 DIAGNOSIS — I5022 Chronic systolic (congestive) heart failure: Secondary | ICD-10-CM | POA: Diagnosis not present

## 2020-02-14 DIAGNOSIS — S72102A Unspecified trochanteric fracture of left femur, initial encounter for closed fracture: Secondary | ICD-10-CM | POA: Diagnosis not present

## 2020-02-14 DIAGNOSIS — M25452 Effusion, left hip: Secondary | ICD-10-CM | POA: Diagnosis not present

## 2020-02-14 DIAGNOSIS — I110 Hypertensive heart disease with heart failure: Secondary | ICD-10-CM | POA: Diagnosis not present

## 2020-02-14 DIAGNOSIS — S72045A Nondisplaced fracture of base of neck of left femur, initial encounter for closed fracture: Secondary | ICD-10-CM | POA: Diagnosis not present

## 2020-02-14 DIAGNOSIS — W19XXXD Unspecified fall, subsequent encounter: Secondary | ICD-10-CM | POA: Diagnosis not present

## 2020-02-14 DIAGNOSIS — Z6821 Body mass index (BMI) 21.0-21.9, adult: Secondary | ICD-10-CM | POA: Diagnosis not present

## 2020-02-14 DIAGNOSIS — R5082 Postprocedural fever: Secondary | ICD-10-CM | POA: Diagnosis not present

## 2020-02-14 DIAGNOSIS — S72142D Displaced intertrochanteric fracture of left femur, subsequent encounter for closed fracture with routine healing: Secondary | ICD-10-CM | POA: Diagnosis not present

## 2020-02-14 DIAGNOSIS — S72002A Fracture of unspecified part of neck of left femur, initial encounter for closed fracture: Secondary | ICD-10-CM | POA: Diagnosis not present

## 2020-02-14 DIAGNOSIS — S72012A Unspecified intracapsular fracture of left femur, initial encounter for closed fracture: Secondary | ICD-10-CM | POA: Diagnosis not present

## 2020-02-14 DIAGNOSIS — Z043 Encounter for examination and observation following other accident: Secondary | ICD-10-CM | POA: Diagnosis not present

## 2020-02-15 DIAGNOSIS — I480 Paroxysmal atrial fibrillation: Secondary | ICD-10-CM | POA: Diagnosis not present

## 2020-02-15 DIAGNOSIS — M25552 Pain in left hip: Secondary | ICD-10-CM | POA: Diagnosis not present

## 2020-02-15 DIAGNOSIS — I110 Hypertensive heart disease with heart failure: Secondary | ICD-10-CM | POA: Diagnosis not present

## 2020-02-15 DIAGNOSIS — F419 Anxiety disorder, unspecified: Secondary | ICD-10-CM | POA: Diagnosis not present

## 2020-02-15 DIAGNOSIS — I5032 Chronic diastolic (congestive) heart failure: Secondary | ICD-10-CM | POA: Diagnosis not present

## 2020-02-15 DIAGNOSIS — S72002A Fracture of unspecified part of neck of left femur, initial encounter for closed fracture: Secondary | ICD-10-CM | POA: Diagnosis not present

## 2020-02-15 DIAGNOSIS — W19XXXA Unspecified fall, initial encounter: Secondary | ICD-10-CM | POA: Diagnosis not present

## 2020-02-15 DIAGNOSIS — F329 Major depressive disorder, single episode, unspecified: Secondary | ICD-10-CM | POA: Diagnosis not present

## 2020-02-15 DIAGNOSIS — I2510 Atherosclerotic heart disease of native coronary artery without angina pectoris: Secondary | ICD-10-CM | POA: Diagnosis not present

## 2020-02-15 DIAGNOSIS — S0990XA Unspecified injury of head, initial encounter: Secondary | ICD-10-CM | POA: Diagnosis not present

## 2020-02-15 DIAGNOSIS — J449 Chronic obstructive pulmonary disease, unspecified: Secondary | ICD-10-CM | POA: Diagnosis not present

## 2020-02-16 DIAGNOSIS — I2510 Atherosclerotic heart disease of native coronary artery without angina pectoris: Secondary | ICD-10-CM | POA: Diagnosis not present

## 2020-02-16 DIAGNOSIS — R4189 Other symptoms and signs involving cognitive functions and awareness: Secondary | ICD-10-CM | POA: Diagnosis not present

## 2020-02-16 DIAGNOSIS — I5032 Chronic diastolic (congestive) heart failure: Secondary | ICD-10-CM | POA: Diagnosis not present

## 2020-02-16 DIAGNOSIS — R5082 Postprocedural fever: Secondary | ICD-10-CM | POA: Diagnosis not present

## 2020-02-16 DIAGNOSIS — I110 Hypertensive heart disease with heart failure: Secondary | ICD-10-CM | POA: Diagnosis not present

## 2020-02-17 DIAGNOSIS — I5032 Chronic diastolic (congestive) heart failure: Secondary | ICD-10-CM | POA: Diagnosis not present

## 2020-02-17 DIAGNOSIS — J449 Chronic obstructive pulmonary disease, unspecified: Secondary | ICD-10-CM | POA: Diagnosis not present

## 2020-02-17 DIAGNOSIS — S72002A Fracture of unspecified part of neck of left femur, initial encounter for closed fracture: Secondary | ICD-10-CM | POA: Diagnosis not present

## 2020-02-17 DIAGNOSIS — I480 Paroxysmal atrial fibrillation: Secondary | ICD-10-CM | POA: Diagnosis not present

## 2020-02-17 DIAGNOSIS — I110 Hypertensive heart disease with heart failure: Secondary | ICD-10-CM | POA: Diagnosis not present

## 2020-02-17 DIAGNOSIS — I2510 Atherosclerotic heart disease of native coronary artery without angina pectoris: Secondary | ICD-10-CM | POA: Diagnosis not present

## 2020-02-17 DIAGNOSIS — R5082 Postprocedural fever: Secondary | ICD-10-CM | POA: Diagnosis not present

## 2020-02-18 DIAGNOSIS — M80052A Age-related osteoporosis with current pathological fracture, left femur, initial encounter for fracture: Secondary | ICD-10-CM | POA: Diagnosis not present

## 2020-02-18 DIAGNOSIS — Z6821 Body mass index (BMI) 21.0-21.9, adult: Secondary | ICD-10-CM | POA: Diagnosis not present

## 2020-02-18 DIAGNOSIS — S72002A Fracture of unspecified part of neck of left femur, initial encounter for closed fracture: Secondary | ICD-10-CM | POA: Diagnosis not present

## 2020-02-19 DIAGNOSIS — S72002A Fracture of unspecified part of neck of left femur, initial encounter for closed fracture: Secondary | ICD-10-CM | POA: Diagnosis not present

## 2020-02-19 DIAGNOSIS — Z6821 Body mass index (BMI) 21.0-21.9, adult: Secondary | ICD-10-CM | POA: Diagnosis not present

## 2020-02-20 DIAGNOSIS — M869 Osteomyelitis, unspecified: Secondary | ICD-10-CM | POA: Diagnosis not present

## 2020-02-20 DIAGNOSIS — S72002A Fracture of unspecified part of neck of left femur, initial encounter for closed fracture: Secondary | ICD-10-CM | POA: Diagnosis not present

## 2020-02-20 DIAGNOSIS — Z6821 Body mass index (BMI) 21.0-21.9, adult: Secondary | ICD-10-CM | POA: Diagnosis not present

## 2020-02-21 DIAGNOSIS — I5032 Chronic diastolic (congestive) heart failure: Secondary | ICD-10-CM | POA: Diagnosis not present

## 2020-02-21 DIAGNOSIS — F418 Other specified anxiety disorders: Secondary | ICD-10-CM | POA: Diagnosis not present

## 2020-02-21 DIAGNOSIS — I252 Old myocardial infarction: Secondary | ICD-10-CM | POA: Diagnosis not present

## 2020-02-21 DIAGNOSIS — R5082 Postprocedural fever: Secondary | ICD-10-CM | POA: Diagnosis not present

## 2020-02-21 DIAGNOSIS — I480 Paroxysmal atrial fibrillation: Secondary | ICD-10-CM | POA: Diagnosis not present

## 2020-02-21 DIAGNOSIS — I2510 Atherosclerotic heart disease of native coronary artery without angina pectoris: Secondary | ICD-10-CM | POA: Diagnosis not present

## 2020-02-21 DIAGNOSIS — M6281 Muscle weakness (generalized): Secondary | ICD-10-CM | POA: Diagnosis not present

## 2020-02-21 DIAGNOSIS — R778 Other specified abnormalities of plasma proteins: Secondary | ICD-10-CM | POA: Diagnosis not present

## 2020-02-21 DIAGNOSIS — E782 Mixed hyperlipidemia: Secondary | ICD-10-CM | POA: Diagnosis not present

## 2020-02-21 DIAGNOSIS — R279 Unspecified lack of coordination: Secondary | ICD-10-CM | POA: Diagnosis not present

## 2020-02-21 DIAGNOSIS — R5381 Other malaise: Secondary | ICD-10-CM | POA: Diagnosis not present

## 2020-02-21 DIAGNOSIS — M8000XD Age-related osteoporosis with current pathological fracture, unspecified site, subsequent encounter for fracture with routine healing: Secondary | ICD-10-CM | POA: Diagnosis not present

## 2020-02-21 DIAGNOSIS — I130 Hypertensive heart and chronic kidney disease with heart failure and stage 1 through stage 4 chronic kidney disease, or unspecified chronic kidney disease: Secondary | ICD-10-CM | POA: Diagnosis not present

## 2020-02-21 DIAGNOSIS — M159 Polyosteoarthritis, unspecified: Secondary | ICD-10-CM | POA: Diagnosis not present

## 2020-02-21 DIAGNOSIS — I5020 Unspecified systolic (congestive) heart failure: Secondary | ICD-10-CM | POA: Diagnosis not present

## 2020-02-21 DIAGNOSIS — R0989 Other specified symptoms and signs involving the circulatory and respiratory systems: Secondary | ICD-10-CM | POA: Diagnosis not present

## 2020-02-21 DIAGNOSIS — I5022 Chronic systolic (congestive) heart failure: Secondary | ICD-10-CM | POA: Diagnosis not present

## 2020-02-21 DIAGNOSIS — N183 Chronic kidney disease, stage 3 unspecified: Secondary | ICD-10-CM | POA: Diagnosis not present

## 2020-02-21 DIAGNOSIS — G309 Alzheimer's disease, unspecified: Secondary | ICD-10-CM | POA: Diagnosis not present

## 2020-02-21 DIAGNOSIS — J449 Chronic obstructive pulmonary disease, unspecified: Secondary | ICD-10-CM | POA: Diagnosis not present

## 2020-02-21 DIAGNOSIS — Z6821 Body mass index (BMI) 21.0-21.9, adult: Secondary | ICD-10-CM | POA: Diagnosis not present

## 2020-02-21 DIAGNOSIS — W19XXXD Unspecified fall, subsequent encounter: Secondary | ICD-10-CM | POA: Diagnosis not present

## 2020-02-21 DIAGNOSIS — S72142D Displaced intertrochanteric fracture of left femur, subsequent encounter for closed fracture with routine healing: Secondary | ICD-10-CM | POA: Diagnosis not present

## 2020-02-21 DIAGNOSIS — I25118 Atherosclerotic heart disease of native coronary artery with other forms of angina pectoris: Secondary | ICD-10-CM | POA: Diagnosis not present

## 2020-02-21 DIAGNOSIS — I110 Hypertensive heart disease with heart failure: Secondary | ICD-10-CM | POA: Diagnosis not present

## 2020-02-21 DIAGNOSIS — I4891 Unspecified atrial fibrillation: Secondary | ICD-10-CM | POA: Diagnosis not present

## 2020-02-21 DIAGNOSIS — F0280 Dementia in other diseases classified elsewhere without behavioral disturbance: Secondary | ICD-10-CM | POA: Diagnosis not present

## 2020-02-22 DIAGNOSIS — I25118 Atherosclerotic heart disease of native coronary artery with other forms of angina pectoris: Secondary | ICD-10-CM | POA: Diagnosis not present

## 2020-02-22 DIAGNOSIS — I5022 Chronic systolic (congestive) heart failure: Secondary | ICD-10-CM | POA: Diagnosis not present

## 2020-02-22 DIAGNOSIS — I480 Paroxysmal atrial fibrillation: Secondary | ICD-10-CM | POA: Diagnosis not present

## 2020-02-22 DIAGNOSIS — F0280 Dementia in other diseases classified elsewhere without behavioral disturbance: Secondary | ICD-10-CM | POA: Diagnosis not present

## 2020-02-22 DIAGNOSIS — M8000XD Age-related osteoporosis with current pathological fracture, unspecified site, subsequent encounter for fracture with routine healing: Secondary | ICD-10-CM | POA: Diagnosis not present

## 2020-02-22 DIAGNOSIS — I4891 Unspecified atrial fibrillation: Secondary | ICD-10-CM | POA: Diagnosis not present

## 2020-02-22 DIAGNOSIS — G309 Alzheimer's disease, unspecified: Secondary | ICD-10-CM | POA: Diagnosis not present

## 2020-02-22 DIAGNOSIS — J449 Chronic obstructive pulmonary disease, unspecified: Secondary | ICD-10-CM | POA: Diagnosis not present

## 2020-03-16 DIAGNOSIS — I2510 Atherosclerotic heart disease of native coronary artery without angina pectoris: Secondary | ICD-10-CM | POA: Diagnosis not present

## 2020-03-16 DIAGNOSIS — J449 Chronic obstructive pulmonary disease, unspecified: Secondary | ICD-10-CM | POA: Diagnosis not present

## 2020-03-16 DIAGNOSIS — I252 Old myocardial infarction: Secondary | ICD-10-CM | POA: Diagnosis not present

## 2020-03-16 DIAGNOSIS — I480 Paroxysmal atrial fibrillation: Secondary | ICD-10-CM | POA: Diagnosis not present

## 2020-03-18 ENCOUNTER — Other Ambulatory Visit: Payer: Self-pay

## 2020-03-18 NOTE — Patient Outreach (Signed)
[facility]) Care Management  03/18/2020  Ashwini Jago Jun 22, 1934 [id number]     Transition of Care Referral  Referral Date: 03/18/2020 Referral Source: [facility] Discharge Report Date of Discharge: 03/15/2020 Facility: [place]: [facility] Medicare    Referral received. Transition of care calls being completed via EMMI-automated calls. RN CM will outreach patient for any red flags received.     Plan: RN CM will close case at this time.    Enzo Montgomery, RN,BSN,CCM [facility] Management Telephonic Care Management Coordinator Direct Phone: [phone] Toll Free: [phone] Fax: [phone]

## 2020-04-01 ENCOUNTER — Encounter: Payer: Self-pay | Admitting: Internal Medicine

## 2020-04-02 ENCOUNTER — Ambulatory Visit (INDEPENDENT_AMBULATORY_CARE_PROVIDER_SITE_OTHER): Payer: Medicare HMO | Admitting: Internal Medicine

## 2020-04-02 ENCOUNTER — Encounter: Payer: Self-pay | Admitting: Internal Medicine

## 2020-04-02 ENCOUNTER — Other Ambulatory Visit: Payer: Self-pay

## 2020-04-02 VITALS — BP 136/64 | HR 58 | Temp 98.1°F | Ht 60.0 in | Wt 124.0 lb

## 2020-04-02 DIAGNOSIS — N183 Chronic kidney disease, stage 3 unspecified: Secondary | ICD-10-CM | POA: Diagnosis not present

## 2020-04-02 DIAGNOSIS — F322 Major depressive disorder, single episode, severe without psychotic features: Secondary | ICD-10-CM | POA: Diagnosis not present

## 2020-04-02 DIAGNOSIS — G629 Polyneuropathy, unspecified: Secondary | ICD-10-CM | POA: Diagnosis not present

## 2020-04-02 DIAGNOSIS — M8000XD Age-related osteoporosis with current pathological fracture, unspecified site, subsequent encounter for fracture with routine healing: Secondary | ICD-10-CM | POA: Diagnosis not present

## 2020-04-02 DIAGNOSIS — I10 Essential (primary) hypertension: Secondary | ICD-10-CM | POA: Diagnosis not present

## 2020-04-02 MED ORDER — LISINOPRIL 30 MG PO TABS: 30.0000 mg | ORAL_TABLET | Freq: Every day | ORAL | 1 refills | Status: DC

## 2020-04-02 MED ORDER — CLONIDINE HCL 0.2 MG PO TABS: 0.4000 mg | ORAL_TABLET | Freq: Two times a day (BID) | ORAL | 1 refills | Status: DC

## 2020-04-02 NOTE — Progress Notes (Signed)
Date:  04/02/2020   Name:  Jessica Howell   DOB:  01-27-1935   MRN:  [id number]   Chief Complaint: Follow-up (rehab f/u, pt is doing well )  Hypertension This is a chronic problem. The problem is controlled. Pertinent negatives include no chest pain, headaches, palpitations or shortness of breath. Past treatments include diuretics, ACE inhibitors, beta blockers and direct vasodilators. The current treatment provides significant improvement. Hypertensive end-organ damage includes CAD/MI and heart failure.  Depression        This is a chronic (had been seeing a Psych for 40+ years who prescribed the celexa, temazepam and tramadol) problem.The problem is unchanged.  Associated symptoms include appetite change (not eating much per her daughter).  Associated symptoms include no fatigue and no headaches.  Past treatments include SSRIs - Selective serotonin reuptake inhibitors.  Compliance with treatment is good. Hip fracture - s/p surgery and 3 weeks rehab stay.  Home for the past 2 weeks, using a walker.  House is one story.  Taking tramadol once a day for pain.  She is in consultation with Endo at [facility] and will likely be referred for Reclast or Boniva.  Lab Results  Component Value Date   CREATININE 1.16 (H) 11/05/2019   BUN 11 11/05/2019   NA 138 11/05/2019   K 4.0 11/05/2019   CL 99 11/05/2019   CO2 27 11/05/2019   Lab Results  Component Value Date   CHOL 227 (H) 10/09/2019   HDL 48 10/09/2019   LDLCALC 156 (H) 10/09/2019   TRIG 129 10/09/2019   CHOLHDL 4.7 (H) 10/09/2019   Lab Results  Component Value Date   TSH 2.840 10/16/2019   Lab Results  Component Value Date   HGBA1C 5.5 02/03/2018   Lab Results  Component Value Date   WBC 6.3 11/06/2019   HGB 11.2 (L) 11/06/2019   HCT 36.4 11/06/2019   MCV 76.5 (L) 11/06/2019   PLT 399 11/06/2019   Lab Results  Component Value Date   ALT 20 10/17/2019   AST 27 10/17/2019   ALKPHOS 53 10/17/2019   BILITOT 0.6 10/17/2019      Review of Systems  Constitutional: Positive for appetite change (not eating much per her daughter) and unexpected weight change (has lost 5lbs). Negative for chills, fatigue and fever.  Respiratory: Negative for cough, chest tightness and shortness of breath.   Cardiovascular: Negative for chest pain, palpitations and leg swelling.  Gastrointestinal: Negative for abdominal pain.  Musculoskeletal: Positive for arthralgias (left hip moves well with minimal pain) and gait problem (uses walker ).  Neurological: Negative for dizziness and headaches.  Psychiatric/Behavioral: Positive for confusion, depression and sleep disturbance (sleeps well with medication). Negative for agitation and behavioral problems.    Patient Active Problem List   Diagnosis Date Noted  . Alzheimer disease ([place]) 02/22/2020  . Closed fracture of left hip ([place]) 02/14/2020  . Neuropathy 11/05/2019  . Aortic atherosclerosis ([place]) 11/05/2019  . Chronic diastolic CHF (congestive heart failure) ([place]) 10/18/2019  . Hyponatremia 10/18/2019  . Pulmonary nodule 1 cm or greater in diameter 10/18/2019  . Thyroid nodule greater than or equal to 1.5 cm in diameter incidentally noted on imaging study 10/18/2019  . CKD (chronic kidney disease) stage 3, GFR 30-59 ml/min (HCC) 10/18/2019  . Microcytic anemia 10/16/2019  . Acquired thrombophilia ([place]) 10/09/2019  . Senile osteoporosis 06/11/2019  . Current severe episode of major depressive disorder without psychotic features ([place]) 06/11/2019  . Myalgia due to statin 06/11/2019  .  Esophageal dysphagia 05/03/2018  . Iron deficiency anemia 03/18/2018  . Other age-related cataract 05/31/2016  . Paroxysmal atrial fibrillation ([place]) 04/15/2016  . Other seborrheic keratosis 01/03/2013  . Coronary atherosclerosis of native coronary artery 01/03/2013  . Vitamin D deficiency 12/19/2012  . Mixed hyperlipidemia 12/19/2012  . Essential (primary) hypertension 12/19/2012  . Chronic  obstructive pulmonary disease, unspecified ([place]) 12/19/2012  . Age-related osteoporosis with current pathological fracture with routine healing 12/19/2012    Allergies  Allergen Reactions  . Penicillins Anaphylaxis, Rash and Other (See Comments)  . Azithromycin Other (See Comments)    abd pain   . Gemfibrozil Nausea And Vomiting and Rash       . Solifenacin     Mild urinary retention  . Statins Nausea And Vomiting and Rash    Stomach pain      Past Surgical History:  Procedure Laterality Date  . ABDOMINAL HYSTERECTOMY    . BREAST SURGERY    . COLONOSCOPY WITH PROPOFOL N/A 10/21/2019   Procedure: COLONOSCOPY WITH PROPOFOL;  Surgeon: Jonathon Bellows, MD;  Location: [facility] ENDOSCOPY;  Service: Gastroenterology;  Laterality: N/A;  . ESOPHAGOGASTRODUODENOSCOPY (EGD) WITH PROPOFOL N/A 10/20/2019   Procedure: ESOPHAGOGASTRODUODENOSCOPY (EGD) WITH PROPOFOL;  Surgeon: Lin Landsman, MD;  Location: [facility] ENDOSCOPY;  Service: Gastroenterology;  Laterality: N/A;  . FOREARM SURGERY    . GIVENS CAPSULE STUDY N/A 10/21/2019   Procedure: GIVENS CAPSULE STUDY;  Surgeon: Jonathon Bellows, MD;  Location: [facility] ENDOSCOPY;  Service: Gastroenterology;  Laterality: N/A;  . TREATMENT INTERTROCHANTERIC, PERITROCHANTERIC, OR SUBTROCHANTERIC FEMUR FX; W/INTRAMED IMPLANT W/WO SCREWS Left 01/2020    Social History   Tobacco Use  . Smoking status: Former Smoker    Packs/day: 0.50    Years: 45.00    Pack years: 22.50    Types: Cigarettes    Quit date: 06/10/2004    Years since quitting: 15.8  . Smokeless tobacco: Never Used  Vaping Use  . Vaping Use: Never used  Substance Use Topics  . Alcohol use: Never  . Drug use: Not Currently     Medication list has been reviewed and updated.  Current Meds  Medication Sig  . acetaminophen (TYLENOL) 325 MG tablet Take 325 mg by mouth every 4 (four) hours as needed.  Marland Kitchen albuterol (VENTOLIN HFA) 108 (90 Base) MCG/ACT inhaler Inhale 2 puffs into the lungs every 6  (six) hours as needed.  . calcium carbonate (TUMS EX) 750 MG chewable tablet Chew 1 tablet by mouth daily.  Marland Kitchen CALCIUM PO Take by mouth 2 (two) times daily.  . Cholecalciferol (VITAMIN D) 125 MCG (5000 UT) CAPS Take 1 capsule by mouth daily.   . cloNIDine (CATAPRES) 0.2 MG tablet Take 0.4 mg by mouth in the morning and at bedtime.   . Cyanocobalamin (VITAMIN B 12 PO) Take by mouth daily.  Marland Kitchen docusate sodium (COLACE) 100 MG capsule Take 100 mg by mouth daily.  Marland Kitchen enoxaparin (LOVENOX) 30 MG/0.3ML injection Inject 30 mg into the skin daily.  Marland Kitchen escitalopram (LEXAPRO) 20 MG tablet Take 20 mg by mouth daily.  . Fluticasone-Salmeterol (ADVAIR DISKUS) 250-50 MCG/DOSE AEPB Inhale 1 puff into the lungs 2 (two) times daily.  . furosemide (LASIX) 40 MG tablet Take 1 tablet (40 mg total) by mouth 2 (two) times daily. Early morning and early afternoon.  . gabapentin (NEURONTIN) 100 MG capsule Take 1 capsule (100 mg total) by mouth 2 (two) times daily. For neuropathy pain.  . hydrALAZINE (APRESOLINE) 50 MG tablet Take 50 mg by mouth  3 (three) times daily.  Marland Kitchen lisinopril (ZESTRIL) 20 MG tablet Take 30 mg by mouth daily.   . Melatonin 5 MG CHEW Chew by mouth.   . metoprolol (TOPROL-XL) 200 MG 24 hr tablet Take 200 mg by mouth daily.  . montelukast (SINGULAIR) 10 MG tablet Take by mouth at bedtime.   . nitroGLYCERIN (NITROSTAT) 0.4 MG SL tablet Place 1 tablet (0.4 mg total) under the tongue every 5 (five) minutes as needed for chest pain.  Marland Kitchen omeprazole (PRILOSEC) 20 MG capsule TAKE 1 CAPSULE (20 MG TOTAL) BY MOUTH 2 (TWO) TIMES DAILY BEFORE A MEAL.  Marland Kitchen polyethylene glycol powder (GLYCOLAX/MIRALAX) 17 GM/SCOOP powder Take 1 Container by mouth once.  . potassium chloride (KLOR-CON) 10 MEQ tablet Take 10 mEq by mouth 2 (two) times daily.   . ramipril (ALTACE) 10 MG capsule Take 10 mg by mouth 2 (two) times daily.   Marland Kitchen senna (SENOKOT) 8.6 MG TABS tablet Take 1 tablet by mouth daily.  . temazepam (RESTORIL) 30 MG capsule  Take 30 mg by mouth at bedtime as needed.   . traMADol (ULTRAM) 50 MG tablet Take by mouth every 6 (six) hours as needed.  Marland Kitchen VITAMIN D PO Take 400 Units by mouth daily.  . vitamin E 180 MG (400 UNITS) capsule Take 400 Units by mouth daily.     PHQ 2/9 Scores 04/02/2020 02/13/2020 11/05/2019 10/09/2019  PHQ - 2 Score 0 4 3 2   PHQ- 9 Score 7 5 7 9     GAD 7 : Generalized Anxiety Score 02/13/2020 11/05/2019 10/09/2019  Nervous, Anxious, on Edge 1 1 2   Control/stop worrying 1 2 2   Worry too much - different things 0 2 2  Trouble relaxing 0 2 2  Restless 0 0 1  Easily annoyed or irritable 0 0 1  Afraid - awful might happen 0 0 2  Total GAD 7 Score 2 7 12   Anxiety Difficulty Not difficult at all Not difficult at all Not difficult at all    BP Readings from Last 3 Encounters:  04/02/20 136/64  02/13/20 136/84  11/06/19 (!) 198/89    Physical Exam Vitals and nursing note reviewed.  Constitutional:      General: She is not in acute distress.    Appearance: Normal appearance. She is well-developed.  HENT:     Head: Normocephalic and atraumatic.  Cardiovascular:     Rate and Rhythm: Normal rate. Rhythm irregular.     Pulses: Normal pulses.  Pulmonary:     Effort: Pulmonary effort is normal. No respiratory distress.     Breath sounds: No wheezing or rhonchi.  Abdominal:     General: Abdomen is flat.     Palpations: Abdomen is soft.  Musculoskeletal:     Cervical back: Normal range of motion.     Right hip: No bony tenderness. Normal range of motion.     Left hip: No bony tenderness. Normal range of motion.     Right lower leg: No edema.     Left lower leg: No edema.  Lymphadenopathy:     Cervical: No cervical adenopathy.  Skin:    General: Skin is warm and dry.     Findings: No rash.  Neurological:     Mental Status: She is alert and oriented to person, place, and time.  Psychiatric:        Attention and Perception: Attention normal.        Mood and Affect: Mood normal.  Speech: Speech normal.        Cognition and Memory: Memory is impaired.     Wt Readings from Last 3 Encounters:  04/02/20 124 lb (56.2 kg)  02/13/20 129 lb (58.5 kg)  11/06/19 139 lb 4.8 oz (63.2 kg)    BP 136/64   Pulse (!) 58   Temp 98.1 F (36.7 C) (Oral)   Ht 5' (1.524 m)   Wt 124 lb (56.2 kg)   SpO2 94%   BMI 24.22 kg/m   Assessment and Plan: 1. Essential (primary) hypertension Clinically stable exam with well controlled BP on clonidine, lasix, lisinopril, metoprolol. She has hydralazine to take as needed for systolic > 721.  I advise discontinuing. Tolerating medications without side effects at this time. Pt to continue current regimen and low sodium diet; benefits of regular exercise as able discussed. - cloNIDine (CATAPRES) 0.2 MG tablet; Take 2 tablets (0.4 mg total) by mouth in the morning and at bedtime.  Dispense: 360 tablet; Refill: 1 - lisinopril (ZESTRIL) 30 MG tablet; Take 1 tablet (30 mg total) by mouth daily.  Dispense: 90 tablet; Refill: 1  2. Age-related osteoporosis with current pathological fracture with routine healing Doing well from hip fracture and repair; completed PTx and now getting around well with a walker at home. Continue Tramadol daily PRN severe pain. Await consult with [facility] regarding OP treatment  3. Stage 3 chronic kidney disease, unspecified whether stage 3a or 3b CKD ([place]) Stabilized at last evaluation in October. Recommend limiting the use of NSAIDS  4. Neuropathy Continues on low dose gabapentin without significant somnolence.  5. Current severe episode of major depressive disorder without psychotic features without prior episode ([place]) Clinically stable on current regimen with good control of symptoms, No SI or HI. Will continue current therapy with Celexa.    Partially dictated using Editor, commissioning. Any errors are unintentional.  Halina Maidens, MD [facility]  04/02/2020

## 2020-04-16 ENCOUNTER — Other Ambulatory Visit: Payer: Self-pay | Admitting: Internal Medicine

## 2020-04-16 DIAGNOSIS — G629 Polyneuropathy, unspecified: Secondary | ICD-10-CM

## 2020-04-16 DIAGNOSIS — R1319 Other dysphagia: Secondary | ICD-10-CM

## 2020-04-23 DIAGNOSIS — G309 Alzheimer's disease, unspecified: Secondary | ICD-10-CM | POA: Diagnosis not present

## 2020-04-23 DIAGNOSIS — U071 COVID-19: Secondary | ICD-10-CM | POA: Diagnosis not present

## 2020-04-23 DIAGNOSIS — K529 Noninfective gastroenteritis and colitis, unspecified: Secondary | ICD-10-CM | POA: Diagnosis not present

## 2020-04-23 DIAGNOSIS — N2889 Other specified disorders of kidney and ureter: Secondary | ICD-10-CM | POA: Diagnosis not present

## 2020-04-23 DIAGNOSIS — S0993XA Unspecified injury of face, initial encounter: Secondary | ICD-10-CM | POA: Diagnosis not present

## 2020-04-23 DIAGNOSIS — R419 Unspecified symptoms and signs involving cognitive functions and awareness: Secondary | ICD-10-CM | POA: Diagnosis not present

## 2020-04-23 DIAGNOSIS — R4182 Altered mental status, unspecified: Secondary | ICD-10-CM | POA: Diagnosis not present

## 2020-04-23 DIAGNOSIS — F05 Delirium due to known physiological condition: Secondary | ICD-10-CM | POA: Diagnosis not present

## 2020-04-23 DIAGNOSIS — M5030 Other cervical disc degeneration, unspecified cervical region: Secondary | ICD-10-CM | POA: Diagnosis not present

## 2020-04-23 DIAGNOSIS — R6520 Severe sepsis without septic shock: Secondary | ICD-10-CM | POA: Diagnosis not present

## 2020-04-23 DIAGNOSIS — I2510 Atherosclerotic heart disease of native coronary artery without angina pectoris: Secondary | ICD-10-CM | POA: Diagnosis not present

## 2020-04-23 DIAGNOSIS — I4891 Unspecified atrial fibrillation: Secondary | ICD-10-CM | POA: Diagnosis not present

## 2020-04-23 DIAGNOSIS — I313 Pericardial effusion (noninflammatory): Secondary | ICD-10-CM | POA: Diagnosis not present

## 2020-04-23 DIAGNOSIS — Z043 Encounter for examination and observation following other accident: Secondary | ICD-10-CM | POA: Diagnosis not present

## 2020-04-23 DIAGNOSIS — G894 Chronic pain syndrome: Secondary | ICD-10-CM | POA: Diagnosis not present

## 2020-04-23 DIAGNOSIS — R918 Other nonspecific abnormal finding of lung field: Secondary | ICD-10-CM | POA: Diagnosis not present

## 2020-04-23 DIAGNOSIS — E041 Nontoxic single thyroid nodule: Secondary | ICD-10-CM | POA: Diagnosis not present

## 2020-04-23 DIAGNOSIS — R1084 Generalized abdominal pain: Secondary | ICD-10-CM | POA: Diagnosis not present

## 2020-04-23 DIAGNOSIS — G8929 Other chronic pain: Secondary | ICD-10-CM | POA: Diagnosis not present

## 2020-04-23 DIAGNOSIS — J0100 Acute maxillary sinusitis, unspecified: Secondary | ICD-10-CM | POA: Diagnosis not present

## 2020-04-23 DIAGNOSIS — I5030 Unspecified diastolic (congestive) heart failure: Secondary | ICD-10-CM | POA: Diagnosis not present

## 2020-04-23 DIAGNOSIS — R0902 Hypoxemia: Secondary | ICD-10-CM | POA: Diagnosis not present

## 2020-04-23 DIAGNOSIS — I5042 Chronic combined systolic (congestive) and diastolic (congestive) heart failure: Secondary | ICD-10-CM | POA: Diagnosis not present

## 2020-04-23 DIAGNOSIS — J441 Chronic obstructive pulmonary disease with (acute) exacerbation: Secondary | ICD-10-CM | POA: Diagnosis not present

## 2020-04-23 DIAGNOSIS — R058 Other specified cough: Secondary | ICD-10-CM | POA: Diagnosis not present

## 2020-04-23 DIAGNOSIS — F0280 Dementia in other diseases classified elsewhere without behavioral disturbance: Secondary | ICD-10-CM | POA: Diagnosis not present

## 2020-04-23 DIAGNOSIS — F068 Other specified mental disorders due to known physiological condition: Secondary | ICD-10-CM | POA: Diagnosis not present

## 2020-04-23 DIAGNOSIS — N183 Chronic kidney disease, stage 3 unspecified: Secondary | ICD-10-CM | POA: Diagnosis not present

## 2020-04-23 DIAGNOSIS — S0990XA Unspecified injury of head, initial encounter: Secondary | ICD-10-CM | POA: Diagnosis not present

## 2020-04-23 DIAGNOSIS — I509 Heart failure, unspecified: Secondary | ICD-10-CM | POA: Diagnosis not present

## 2020-04-23 DIAGNOSIS — I10 Essential (primary) hypertension: Secondary | ICD-10-CM | POA: Diagnosis not present

## 2020-04-23 DIAGNOSIS — I4949 Other premature depolarization: Secondary | ICD-10-CM | POA: Diagnosis not present

## 2020-04-23 DIAGNOSIS — J811 Chronic pulmonary edema: Secondary | ICD-10-CM | POA: Diagnosis not present

## 2020-04-23 DIAGNOSIS — A0839 Other viral enteritis: Secondary | ICD-10-CM | POA: Diagnosis not present

## 2020-04-23 DIAGNOSIS — I130 Hypertensive heart and chronic kidney disease with heart failure and stage 1 through stage 4 chronic kidney disease, or unspecified chronic kidney disease: Secondary | ICD-10-CM | POA: Diagnosis not present

## 2020-04-23 DIAGNOSIS — Z6824 Body mass index (BMI) 24.0-24.9, adult: Secondary | ICD-10-CM | POA: Diagnosis not present

## 2020-04-23 DIAGNOSIS — A4189 Other specified sepsis: Secondary | ICD-10-CM | POA: Diagnosis not present

## 2020-04-23 DIAGNOSIS — J449 Chronic obstructive pulmonary disease, unspecified: Secondary | ICD-10-CM | POA: Diagnosis not present

## 2020-04-23 DIAGNOSIS — R93421 Abnormal radiologic findings on diagnostic imaging of right kidney: Secondary | ICD-10-CM | POA: Diagnosis not present

## 2020-04-23 DIAGNOSIS — Z20822 Contact with and (suspected) exposure to covid-19: Secondary | ICD-10-CM | POA: Diagnosis not present

## 2020-04-23 DIAGNOSIS — W19XXXA Unspecified fall, initial encounter: Secondary | ICD-10-CM | POA: Diagnosis not present

## 2020-04-23 DIAGNOSIS — B342 Coronavirus infection, unspecified: Secondary | ICD-10-CM | POA: Diagnosis not present

## 2020-04-23 DIAGNOSIS — R509 Fever, unspecified: Secondary | ICD-10-CM | POA: Diagnosis not present

## 2020-04-23 DIAGNOSIS — S199XXA Unspecified injury of neck, initial encounter: Secondary | ICD-10-CM | POA: Diagnosis not present

## 2020-04-23 DIAGNOSIS — D72829 Elevated white blood cell count, unspecified: Secondary | ICD-10-CM | POA: Diagnosis not present

## 2020-04-23 DIAGNOSIS — I281 Aneurysm of pulmonary artery: Secondary | ICD-10-CM | POA: Diagnosis not present

## 2020-05-05 DIAGNOSIS — I5022 Chronic systolic (congestive) heart failure: Secondary | ICD-10-CM | POA: Diagnosis not present

## 2020-05-05 DIAGNOSIS — E782 Mixed hyperlipidemia: Secondary | ICD-10-CM | POA: Diagnosis not present

## 2020-05-05 DIAGNOSIS — I10 Essential (primary) hypertension: Secondary | ICD-10-CM | POA: Diagnosis not present

## 2020-05-05 DIAGNOSIS — I25118 Atherosclerotic heart disease of native coronary artery with other forms of angina pectoris: Secondary | ICD-10-CM | POA: Diagnosis not present

## 2020-05-05 DIAGNOSIS — R55 Syncope and collapse: Secondary | ICD-10-CM | POA: Diagnosis not present

## 2020-05-05 DIAGNOSIS — I480 Paroxysmal atrial fibrillation: Secondary | ICD-10-CM | POA: Diagnosis not present

## 2020-05-07 ENCOUNTER — Telehealth: Payer: Self-pay

## 2020-05-07 NOTE — Telephone Encounter (Signed)
Pt has appt on 05/15/2020.  KP

## 2020-05-07 NOTE — Telephone Encounter (Signed)
Copied from [place] [id number]. Topic: Appointment Scheduling - Scheduling Inquiry for Clinic >> May 06, 2020  2:17 PM Oneta Rack wrote: Reason for CRM: Caller would like to schedule hospital follow up with PCP within 7 days. PCP first available is not until Feb, please advise

## 2020-05-08 ENCOUNTER — Encounter: Payer: Self-pay | Admitting: Oncology

## 2020-05-08 ENCOUNTER — Inpatient Hospital Stay: Payer: Medicare HMO | Attending: Oncology

## 2020-05-08 ENCOUNTER — Ambulatory Visit: Payer: Medicare HMO | Admitting: Oncology

## 2020-05-08 ENCOUNTER — Other Ambulatory Visit: Payer: Medicare HMO

## 2020-05-08 ENCOUNTER — Inpatient Hospital Stay (HOSPITAL_BASED_OUTPATIENT_CLINIC_OR_DEPARTMENT_OTHER): Payer: Medicare HMO | Admitting: Oncology

## 2020-05-08 VITALS — BP 107/59 | HR 69 | Temp 97.0°F | Resp 16 | Wt 121.0 lb

## 2020-05-08 DIAGNOSIS — F32A Depression, unspecified: Secondary | ICD-10-CM | POA: Insufficient documentation

## 2020-05-08 DIAGNOSIS — Z8249 Family history of ischemic heart disease and other diseases of the circulatory system: Secondary | ICD-10-CM | POA: Insufficient documentation

## 2020-05-08 DIAGNOSIS — E538 Deficiency of other specified B group vitamins: Secondary | ICD-10-CM

## 2020-05-08 DIAGNOSIS — Z87891 Personal history of nicotine dependence: Secondary | ICD-10-CM | POA: Diagnosis not present

## 2020-05-08 DIAGNOSIS — I10 Essential (primary) hypertension: Secondary | ICD-10-CM | POA: Insufficient documentation

## 2020-05-08 DIAGNOSIS — D509 Iron deficiency anemia, unspecified: Secondary | ICD-10-CM | POA: Diagnosis not present

## 2020-05-08 DIAGNOSIS — I2510 Atherosclerotic heart disease of native coronary artery without angina pectoris: Secondary | ICD-10-CM | POA: Diagnosis not present

## 2020-05-08 DIAGNOSIS — Z79899 Other long term (current) drug therapy: Secondary | ICD-10-CM | POA: Insufficient documentation

## 2020-05-08 DIAGNOSIS — Z7951 Long term (current) use of inhaled steroids: Secondary | ICD-10-CM | POA: Diagnosis not present

## 2020-05-08 DIAGNOSIS — Z90710 Acquired absence of both cervix and uterus: Secondary | ICD-10-CM | POA: Diagnosis not present

## 2020-05-08 DIAGNOSIS — I4891 Unspecified atrial fibrillation: Secondary | ICD-10-CM | POA: Insufficient documentation

## 2020-05-08 DIAGNOSIS — Z833 Family history of diabetes mellitus: Secondary | ICD-10-CM | POA: Insufficient documentation

## 2020-05-08 LAB — CBC
HCT: 40.1 % (ref 36.0–46.0)
Hemoglobin: 13.8 g/dL (ref 12.0–15.0)
MCH: 28.2 pg (ref 26.0–34.0)
MCHC: 34.4 g/dL (ref 30.0–36.0)
MCV: 82 fL (ref 80.0–100.0)
Platelets: 263 10*3/uL (ref 150–400)
RBC: 4.89 MIL/uL (ref 3.87–5.11)
RDW: 13.2 % (ref 11.5–15.5)
WBC: 11.5 10*3/uL — ABNORMAL HIGH (ref 4.0–10.5)
nRBC: 0 % (ref 0.0–0.2)

## 2020-05-08 LAB — IRON AND TIBC
Iron: 33 ug/dL (ref 28–170)
Saturation Ratios: 10 % — ABNORMAL LOW (ref 10.4–31.8)
TIBC: 323 ug/dL (ref 250–450)
UIBC: 290 ug/dL

## 2020-05-08 LAB — VITAMIN B12: Vitamin B-12: 377 pg/mL (ref 180–914)

## 2020-05-08 LAB — FERRITIN: Ferritin: 112 ng/mL (ref 11–307)

## 2020-05-08 NOTE — Progress Notes (Signed)
Patient daughter stated that her mother been having coffee like residual in her bed side commode. She is concerned about what it might be.

## 2020-05-11 NOTE — Progress Notes (Signed)
Hematology/Oncology Consult note [facility]  [phone] [phone]  Patient Care Team: Glean Hess, MD as PCP - General (Internal Medicine) Dr. Pasty Arch (Psychiatry) Corey Skains, MD as Consulting Physician (Cardiology) Sindy Guadeloupe, MD as Consulting Physician (Oncology) Virgel Manifold, MD as Consulting Physician (Gastroenterology) Cyndi Bender, MD (Psychiatry)   Name of the patient: Jessica Howell  OZ:[id number]  1934-05-28   Date of visit: 05/11/20  Diagnosis- iron deficiency anemia of unclear etiology  Chief complaint/ Reason for visit-routine follow-up of iron deficiency anemia  Heme/Onc history: Patient is a 86 year old female with a past medical history significant for coronary artery disease, hypertension, COPD, depression among other medical problems. She has been referred to [place] for iron deficiency anemia. Most recent CBC from 10/09/2019 showed white count of 6.4, H&H of 7.2/23.6 with an MCV of 73 and a platelet count which was inaccurate due to aggregation of platelets. Her prior hemoglobin from February was normal at 14.6.   Plan was to give her 2 doses of Feraheme for iron deficiency.  However patient had possible anxiety/panic attack after few mL of Feraheme.  Her systolic blood pressure went up to the 240s and patient complained of intense back pain and was sent to the ER.  She was then admitted to the hospital and underwent inpatient work-up including EGD colonoscopy and capsule study which did not show any evidence of active bleeding.  She received 2 doses of Venofer 300 mg and 1 unit of PRBC in the hospital.  Xarelto is currently on hold and okay per cardiology to be discontinued   Interval history-patient reports chronic fatigue.  She recently had a couple of falls including a hip fracture requiring surgery and 3 weeks of rehab stay.  ECOG PS- 2-3 Pain scale- 3   Review of systems- Review  of Systems  Constitutional: Positive for malaise/fatigue. Negative for chills, fever and weight loss.  HENT: Negative for congestion, ear discharge and nosebleeds.   Eyes: Negative for blurred vision.  Respiratory: Negative for cough, hemoptysis, sputum production, shortness of breath and wheezing.   Cardiovascular: Negative for chest pain, palpitations, orthopnea and claudication.  Gastrointestinal: Negative for abdominal pain, blood in stool, constipation, diarrhea, heartburn, melena, nausea and vomiting.  Genitourinary: Negative for dysuria, flank pain, frequency, hematuria and urgency.  Musculoskeletal: Positive for falls. Negative for back pain, joint pain and myalgias.  Skin: Negative for rash.  Neurological: Negative for dizziness, tingling, focal weakness, seizures, weakness and headaches.  Endo/Heme/Allergies: Does not bruise/bleed easily.  Psychiatric/Behavioral: Negative for depression and suicidal ideas. The patient does not have insomnia.       Allergies  Allergen Reactions  . Penicillins Anaphylaxis, Rash and Other (See Comments)  . Azithromycin Other (See Comments)    abd pain   . Gemfibrozil Nausea And Vomiting and Rash       . Solifenacin     Mild urinary retention  . Statins Nausea And Vomiting and Rash    Stomach pain       Past Medical History:  Diagnosis Date  . A-fib ([place])   . Allergy   . Anemia   . CHF (congestive heart failure) ([place])   . Chronic anticoagulation, recent, Xarelto  10/18/2019  . COPD (chronic obstructive pulmonary disease) ([place])   . Depression   . Feeling grief   . GERD (gastroesophageal reflux disease)   . Hyperlipidemia   . Hypertension   . Hypertensive urgency 10/18/2019  . Hypokalemia  09/06/2016   Last Assessment & Plan:  Formatting of this note is different from the original. Will continue to monitor; ordered labs as below. Lab Results  Component Value Date   K 3.2 (L) 04/12/2016   K 3.5 04/10/2016   K 4.2 04/09/2016   K 4.4  11/21/2013   K 4.4 01/03/2013  . Myocardial infarction ([place])   . Osteoarthritis   . Post-nasal drip 11/10/2015   Formatting of this note might be different from the original. Overview:   (Working Diagnosis)  Last Assessment & Plan:  Formatting of this note might be different from the original. Continue OTC flonase 2 sprays daily Likely contributing to cough.  Will see if flonase helps with cough and PND  . Right wrist pain 06/20/2015   Last Assessment & Plan:  Formatting of this note might be different from the original. Significant edema, and some grip and extension weakness, and persistent severe pain. She was not able to tolerate splint Reviewed xr of her recent ED visit No fractures identified (except old fracture on radial head)  +++ snuff box tenderness persists [facility] Information provide  . Upper GI bleed 10/19/2019  . Vitamin D deficiency      Past Surgical History:  Procedure Laterality Date  . ABDOMINAL HYSTERECTOMY    . BREAST SURGERY    . COLONOSCOPY WITH PROPOFOL N/A 10/21/2019   Procedure: COLONOSCOPY WITH PROPOFOL;  Surgeon: Jonathon Bellows, MD;  Location: [facility] ENDOSCOPY;  Service: Gastroenterology;  Laterality: N/A;  . ESOPHAGOGASTRODUODENOSCOPY (EGD) WITH PROPOFOL N/A 10/20/2019   Procedure: ESOPHAGOGASTRODUODENOSCOPY (EGD) WITH PROPOFOL;  Surgeon: Lin Landsman, MD;  Location: [facility] ENDOSCOPY;  Service: Gastroenterology;  Laterality: N/A;  . FOREARM SURGERY    . GIVENS CAPSULE STUDY N/A 10/21/2019   Procedure: GIVENS CAPSULE STUDY;  Surgeon: Jonathon Bellows, MD;  Location: [facility] ENDOSCOPY;  Service: Gastroenterology;  Laterality: N/A;  . TREATMENT INTERTROCHANTERIC, PERITROCHANTERIC, OR SUBTROCHANTERIC FEMUR FX; W/INTRAMED IMPLANT W/WO SCREWS Left 01/2020    Social History   Socioeconomic History  . Marital status: Widowed    Spouse name: Not on file  . Number of children: 1  . Years of education: Not on file  . Highest education level: Not  on file  Occupational History  . Occupation: retired  Tobacco Use  . Smoking status: Former Smoker    Packs/day: 0.50    Years: 45.00    Pack years: 22.50    Types: Cigarettes    Quit date: 06/10/2004    Years since quitting: 15.9  . Smokeless tobacco: Never Used  Vaping Use  . Vaping Use: Never used  Substance and Sexual Activity  . Alcohol use: Never  . Drug use: Not Currently  . Sexual activity: Not Currently  Other Topics Concern  . Not on file  Social History Narrative   Living alone. Husband passed away in Nov 18, 2018. Daughter checking in on her and great grandson checks in also.    Social Determinants of Health   Financial Resource Strain: Low Risk   . Difficulty of Paying Living Expenses: Not very hard  Food Insecurity: No Food Insecurity  . Worried About Charity fundraiser in the Last Year: Never true  . Ran Out of Food in the Last Year: Never true  Transportation Needs: No Transportation Needs  . Lack of Transportation (Medical): No  . Lack of Transportation (Non-Medical): No  Physical Activity: Inactive  . Days of Exercise per Week: 0 days  .  Minutes of Exercise per Session: 0 min  Stress: Stress Concern Present  . Feeling of Stress : Very much  Social Connections: Unknown  . Frequency of Communication with Friends and Family: Patient refused  . Frequency of Social Gatherings with Friends and Family: Patient refused  . Attends Religious Services: Patient refused  . Active Member of Clubs or Organizations: Patient refused  . Attends Archivist Meetings: Patient refused  . Marital Status: Widowed  Intimate Partner Violence: Not At Risk  . Fear of Current or Ex-Partner: No  . Emotionally Abused: No  . Physically Abused: No  . Sexually Abused: No    Family History  Problem Relation Age of Onset  . Diabetes Mother   . Hypertension Mother   . Cancer Father        esoph.   Marland Kitchen COPD Sister   . Heart disease Brother   . Early death Son       Current Outpatient Medications:  .  acetaminophen (TYLENOL) 325 MG tablet, Take 325 mg by mouth every 4 (four) hours as needed., Disp: , Rfl:  .  albuterol (VENTOLIN HFA) 108 (90 Base) MCG/ACT inhaler, Inhale 2 puffs into the lungs every 6 (six) hours as needed., Disp: 54 g, Rfl: 1 .  calcium carbonate (TUMS EX) 750 MG chewable tablet, Chew 1 tablet by mouth as needed., Disp: , Rfl:  .  CALCIUM PO, Take by mouth 2 (two) times daily., Disp: , Rfl:  .  Cholecalciferol (VITAMIN D) 125 MCG (5000 UT) CAPS, Take 1 capsule by mouth daily. , Disp: , Rfl:  .  cloNIDine (CATAPRES) 0.2 MG tablet, Take 2 tablets (0.4 mg total) by mouth in the morning and at bedtime., Disp: 360 tablet, Rfl: 1 .  Cyanocobalamin (VITAMIN B 12 PO), Take 1,000 mcg by mouth daily., Disp: , Rfl:  .  escitalopram (LEXAPRO) 20 MG tablet, Take 20 mg by mouth daily., Disp: , Rfl:  .  ferrous gluconate (IRON 27) 240 (27 FE) MG tablet, Take 1 tablet (240 mg total) by mouth daily., Disp: 30 tablet, Rfl: 2 .  Fluticasone-Salmeterol (ADVAIR DISKUS) 250-50 MCG/DOSE AEPB, Inhale 1 puff into the lungs 2 (two) times daily., Disp: 60 each, Rfl: 6 .  furosemide (LASIX) 40 MG tablet, Take 1 tablet (40 mg total) by mouth 2 (two) times daily. Early morning and early afternoon., Disp: 180 tablet, Rfl: 1 .  gabapentin (NEURONTIN) 100 MG capsule, TAKE 1 CAPSULE (100 MG TOTAL) BY MOUTH 2 (TWO) TIMES DAILY. FOR NEUROPATHY PAIN., Disp: 180 capsule, Rfl: 1 .  hydrALAZINE (APRESOLINE) 50 MG tablet, Take 50 mg by mouth 3 (three) times daily., Disp: , Rfl:  .  ibuprofen (ADVIL) 600 MG tablet, as needed., Disp: , Rfl:  .  Melatonin 5 MG CHEW, Chew by mouth. , Disp: , Rfl:  .  metoprolol (TOPROL-XL) 200 MG 24 hr tablet, Take 200 mg by mouth daily., Disp: , Rfl:  .  montelukast (SINGULAIR) 10 MG tablet, Take by mouth at bedtime. , Disp: , Rfl:  .  omeprazole (PRILOSEC) 20 MG capsule, TAKE 1 CAPSULE TWICE DAILY BEFORE MEALS, Disp: 180 capsule, Rfl: 1 .   polyethylene glycol powder (GLYCOLAX/MIRALAX) 17 GM/SCOOP powder, Take 1 Container by mouth as needed., Disp: , Rfl:  .  potassium chloride (KLOR-CON) 10 MEQ tablet, Take 10 mEq by mouth 2 (two) times daily. , Disp: , Rfl:  .  temazepam (RESTORIL) 30 MG capsule, Take 30 mg by mouth at bedtime as needed. ,  Disp: , Rfl:  .  VITAMIN D PO, Take 400 Units by mouth daily., Disp: , Rfl:  .  vitamin E 180 MG (400 UNITS) capsule, Take 400 Units by mouth daily. , Disp: , Rfl:  .  lisinopril (ZESTRIL) 30 MG tablet, Take 1 tablet (30 mg total) by mouth daily. (Patient taking differently: Take 40 mg by mouth daily.), Disp: 90 tablet, Rfl: 1 .  nitroGLYCERIN (NITROSTAT) 0.4 MG SL tablet, Place 1 tablet (0.4 mg total) under the tongue every 5 (five) minutes as needed for chest pain. (Patient not taking: Reported on 05/08/2020), Disp: 30 tablet, Rfl: 1 .  traMADol (ULTRAM) 50 MG tablet, Take by mouth every 6 (six) hours as needed. (Patient not taking: Reported on 05/08/2020), Disp: , Rfl:   Physical exam:  Vitals:   05/08/20 1445  BP: (!) 107/59  Pulse: 69  Resp: 16  Temp: (!) 97 F (36.1 C)  TempSrc: Tympanic  Weight: 121 lb (54.9 kg)   Physical Exam Constitutional:      Comments: Thin elderly frail woman sitting in a wheelchair.  Appears in no acute distress  HENT:     Head: Normocephalic and atraumatic.  Eyes:     Extraocular Movements: EOM normal.  Cardiovascular:     Rate and Rhythm: Normal rate. Rhythm irregular.     Heart sounds: Normal heart sounds.  Pulmonary:     Effort: Pulmonary effort is normal.     Breath sounds: Normal breath sounds.  Skin:    General: Skin is warm and dry.  Neurological:     Mental Status: She is alert and oriented to person, place, and time.      CMP Latest Ref Rng & Units 11/05/2019  Glucose 65 - 99 mg/dL 96  BUN 8 - 27 mg/dL 11  Creatinine 0.57 - 1.00 mg/dL 1.16(H)  Sodium 134 - 144 mmol/L 138  Potassium 3.5 - 5.2 mmol/L 4.0  Chloride 96 - 106 mmol/L 99   CO2 20 - 29 mmol/L 27  Calcium 8.7 - 10.3 mg/dL 10.2  Total Protein 6.5 - 8.1 g/dL -  Total Bilirubin 0.3 - 1.2 mg/dL -  Alkaline Phos 38 - 126 U/L -  AST 15 - 41 U/L -  ALT 0 - 44 U/L -   CBC Latest Ref Rng & Units 05/08/2020  WBC 4.0 - 10.5 K/uL 11.5(H)  Hemoglobin 12.0 - 15.0 g/dL 13.8  Hematocrit 36.0 - 46.0 % 40.1  Platelets 150 - 400 K/uL 263     Assessment and plan- Patient is a 86 y.o. female with history of iron deficiency anemia here for a routine follow-up  Patient is not currently anemic with a hemoglobin of 13.8 which is significantly improved as compared to a prior value of 8.3 in June 2021.  Iron studies are presently normal.  She will continue to take oral iron as tolerated.  With regards to restarting Xarelto from an anemia standpoint it would be okay for her to start Xarelto.  However she will need to discuss this further with her primary care Dr. Army Melia about the risks benefits of Xarelto in the setting of advanced age and frequent falls  Dr. Army Melia can keep an eye on her anemia and she can be referred to [place] in the future if questions or concerns arise   Visit Diagnosis 1. Iron deficiency anemia, unspecified iron deficiency anemia type      Dr. Randa Evens, MD, MPH [facility] at [facility] [phone] 05/11/2020 7:59 PM

## 2020-05-15 ENCOUNTER — Inpatient Hospital Stay: Payer: Medicare HMO | Admitting: Internal Medicine

## 2020-06-11 ENCOUNTER — Other Ambulatory Visit: Payer: Self-pay | Admitting: Internal Medicine

## 2020-06-11 DIAGNOSIS — I10 Essential (primary) hypertension: Secondary | ICD-10-CM

## 2020-06-13 ENCOUNTER — Other Ambulatory Visit: Payer: Self-pay | Admitting: Internal Medicine

## 2020-06-13 DIAGNOSIS — I10 Essential (primary) hypertension: Secondary | ICD-10-CM

## 2020-06-17 ENCOUNTER — Ambulatory Visit: Payer: Medicare HMO | Admitting: Internal Medicine

## 2020-07-01 ENCOUNTER — Encounter: Payer: Self-pay | Admitting: Internal Medicine

## 2020-07-01 ENCOUNTER — Ambulatory Visit (INDEPENDENT_AMBULATORY_CARE_PROVIDER_SITE_OTHER): Payer: Medicare HMO | Admitting: Internal Medicine

## 2020-07-01 ENCOUNTER — Other Ambulatory Visit: Payer: Self-pay

## 2020-07-01 VITALS — BP 136/82 | HR 60 | Ht 60.0 in | Wt 124.0 lb

## 2020-07-01 DIAGNOSIS — K591 Functional diarrhea: Secondary | ICD-10-CM

## 2020-07-01 DIAGNOSIS — I10 Essential (primary) hypertension: Secondary | ICD-10-CM | POA: Diagnosis not present

## 2020-07-01 DIAGNOSIS — I480 Paroxysmal atrial fibrillation: Secondary | ICD-10-CM | POA: Diagnosis not present

## 2020-07-01 DIAGNOSIS — J449 Chronic obstructive pulmonary disease, unspecified: Secondary | ICD-10-CM

## 2020-07-01 DIAGNOSIS — F322 Major depressive disorder, single episode, severe without psychotic features: Secondary | ICD-10-CM | POA: Diagnosis not present

## 2020-07-01 DIAGNOSIS — I5032 Chronic diastolic (congestive) heart failure: Secondary | ICD-10-CM

## 2020-07-01 DIAGNOSIS — I700 Atherosclerosis of aorta: Secondary | ICD-10-CM

## 2020-07-01 MED ORDER — AMLODIPINE BESYLATE 10 MG PO TABS
10.0000 mg | ORAL_TABLET | Freq: Every day | ORAL | 1 refills | Status: DC
Start: 2020-07-01 — End: 2020-11-26

## 2020-07-01 NOTE — Patient Instructions (Signed)
Start Metamucil daily for loose stools

## 2020-07-01 NOTE — Progress Notes (Signed)
Date:  07/01/2020   Name:  Jessica Howell   DOB:  22-Apr-1935   MRN:  [id number]   Chief Complaint: Rehab Follow up (Patients POA is here today - Jessica Howell.)  Hypertension This is a chronic problem. The problem is controlled. Associated symptoms include palpitations. Pertinent negatives include no chest pain or shortness of breath. Past treatments include ACE inhibitors, calcium channel blockers, beta blockers, diuretics and direct vasodilators. The current treatment provides moderate improvement. There are no compliance problems.   Depression        This is a chronic (followed by psychiatry) problem.The problem is unchanged.  Past treatments include SSRIs - Selective serotonin reuptake inhibitors.  (tends to have formed stool with water in the AM; sometimes has trouble getting to the toilet without fecal soiling) Diarrhea  This is a recurrent problem. The problem occurs 2 to 4 times per day. The problem has been waxing and waning. The stool consistency is described as watery. The patient states that diarrhea does not awaken her from sleep. Pertinent negatives include no abdominal pain, bloating, chills, coughing, fever, vomiting or weight loss. Nothing aggravates the symptoms. There are no known risk factors. tends to have formed stool with water in the AM; sometimes has trouble getting to the toilet without fecal soiling   Afib - paroxysmal Afib on recent monitor.  Seeing Cardiology next week.  Stopped blood thinner due to bleeding several months ago.  No significant symptoms.  COPD - told she had COPD at [facility].  She has an inhaler that she uses. She denies significant SOB, cough or wheeze.  Atherosclerosis - seen on imaging.  She has been unable to take statins.  [facility] MD suggested Repatha but she was never contacted to start this.  I recommend that she discuss with Dr. Nehemiah Massed. Lab Results  Component Value Date   CREATININE 1.16 (H) 11/05/2019   BUN 11 11/05/2019   NA 138 11/05/2019   K  4.0 11/05/2019   CL 99 11/05/2019   CO2 27 11/05/2019   Lab Results  Component Value Date   CHOL 227 (H) 10/09/2019   HDL 48 10/09/2019   LDLCALC 156 (H) 10/09/2019   TRIG 129 10/09/2019   CHOLHDL 4.7 (H) 10/09/2019   Lab Results  Component Value Date   TSH 2.840 10/16/2019   Lab Results  Component Value Date   HGBA1C 5.5 02/03/2018   Lab Results  Component Value Date   WBC 11.5 (H) 05/08/2020   HGB 13.8 05/08/2020   HCT 40.1 05/08/2020   MCV 82.0 05/08/2020   PLT 263 05/08/2020   Lab Results  Component Value Date   ALT 20 10/17/2019   AST 27 10/17/2019   ALKPHOS 53 10/17/2019   BILITOT 0.6 10/17/2019     Review of Systems  Constitutional: Negative for chills, fever, unexpected weight change and weight loss.  Respiratory: Negative for cough, chest tightness, shortness of breath and wheezing.   Cardiovascular: Positive for palpitations. Negative for chest pain and leg swelling.  Gastrointestinal: Positive for diarrhea. Negative for abdominal pain, bloating and vomiting.  Hematological: Negative for adenopathy.  Psychiatric/Behavioral: Positive for depression. Negative for dysphoric mood. The patient is not nervous/anxious.     Patient Active Problem List   Diagnosis Date Noted  . Alzheimer disease ([place]) 02/22/2020  . Closed fracture of left hip ([place]) 02/14/2020  . Neuropathy 11/05/2019  . Aortic atherosclerosis ([place]) 11/05/2019  . Chronic diastolic CHF (congestive heart failure) ([place]) 10/18/2019  . Hyponatremia 10/18/2019  .  Pulmonary nodule 1 cm or greater in diameter 10/18/2019  . Thyroid nodule greater than or equal to 1.5 cm in diameter incidentally noted on imaging study 10/18/2019  . CKD (chronic kidney disease) stage 3, GFR 30-59 ml/min (HCC) 10/18/2019  . Microcytic anemia 10/16/2019  . Acquired thrombophilia ([place]) 10/09/2019  . Senile osteoporosis 06/11/2019  . Current severe episode of major depressive disorder without psychotic features ([place])  06/11/2019  . Myalgia due to statin 06/11/2019  . Esophageal dysphagia 05/03/2018  . Iron deficiency anemia 03/18/2018  . Other age-related cataract 05/31/2016  . Paroxysmal atrial fibrillation ([place]) 04/15/2016  . Other seborrheic keratosis 01/03/2013  . Coronary atherosclerosis of native coronary artery 01/03/2013  . Vitamin D deficiency 12/19/2012  . Mixed hyperlipidemia 12/19/2012  . Essential (primary) hypertension 12/19/2012  . Chronic obstructive pulmonary disease, unspecified ([place]) 12/19/2012  . Age-related osteoporosis with current pathological fracture with routine healing 12/19/2012    Allergies  Allergen Reactions  . Penicillins Anaphylaxis, Rash and Other (See Comments)  . Azithromycin Other (See Comments)    abd pain   . Gemfibrozil Nausea And Vomiting and Rash       . Solifenacin     Mild urinary retention  . Statins Nausea And Vomiting and Rash    Stomach pain      Past Surgical History:  Procedure Laterality Date  . ABDOMINAL HYSTERECTOMY    . BREAST SURGERY    . COLONOSCOPY WITH PROPOFOL N/A 10/21/2019   Procedure: COLONOSCOPY WITH PROPOFOL;  Surgeon: Jonathon Bellows, MD;  Location: [facility] ENDOSCOPY;  Service: Gastroenterology;  Laterality: N/A;  . ESOPHAGOGASTRODUODENOSCOPY (EGD) WITH PROPOFOL N/A 10/20/2019   Procedure: ESOPHAGOGASTRODUODENOSCOPY (EGD) WITH PROPOFOL;  Surgeon: Lin Landsman, MD;  Location: [facility] ENDOSCOPY;  Service: Gastroenterology;  Laterality: N/A;  . FOREARM SURGERY    . GIVENS CAPSULE STUDY N/A 10/21/2019   Procedure: GIVENS CAPSULE STUDY;  Surgeon: Jonathon Bellows, MD;  Location[facility] ENDOSCOPY;  Service: Gastroenterology;  Laterality: N/A;  . TREATMENT INTERTROCHANTERIC, PERITROCHANTERIC, OR SUBTROCHANTERIC FEMUR FX; W/INTRAMED IMPLANT W/WO SCREWS Left 01/2020    Social History   Tobacco Use  . Smoking status: Former Smoker    Packs/day: 0.50    Years: 45.00    Pack years: 22.50    Types: Cigarettes    Quit date: 06/10/2004     Years since quitting: 16.0  . Smokeless tobacco: Never Used  Vaping Use  . Vaping Use: Never used  Substance Use Topics  . Alcohol use: Never  . Drug use: Not Currently     Medication list has been reviewed and updated.  Current Meds  Medication Sig  . acetaminophen (TYLENOL) 325 MG tablet Take 325 mg by mouth every 4 (four) hours as needed.  Marland Kitchen albuterol (VENTOLIN HFA) 108 (90 Base) MCG/ACT inhaler Inhale 2 puffs into the lungs every 6 (six) hours as needed.  Marland Kitchen CALCIUM PO Take 600 mg by mouth 2 (two) times daily.  . cloNIDine (CATAPRES) 0.2 MG tablet TAKE 2 TABLETS EVERY MORNING AND TAKE 2 TABLETS AT BEDTIME  . Cyanocobalamin (VITAMIN B 12 PO) Take 1,000 mcg by mouth daily.  Marland Kitchen escitalopram (LEXAPRO) 10 MG tablet Take 20 mg by mouth daily.  . ferrous gluconate (FERGON) 324 MG tablet Take 324 mg by mouth daily.  . fluticasone (FLONASE) 50 MCG/ACT nasal spray Place 2 sprays into both nostrils daily.  . Fluticasone-Salmeterol (ADVAIR DISKUS) 250-50 MCG/DOSE AEPB Inhale 1 puff into the lungs 2 (two) times daily.  . furosemide (LASIX) 40 MG tablet  TAKE 1 TABLET TWICE DAILY EARLY MORNING AND EARLY AFTERNOON  . gabapentin (NEURONTIN) 100 MG capsule TAKE 1 CAPSULE (100 MG TOTAL) BY MOUTH 2 (TWO) TIMES DAILY. FOR NEUROPATHY PAIN.  . hydrALAZINE (APRESOLINE) 50 MG tablet Take 50 mg by mouth 3 (three) times daily. prn  . HYDROcodone-acetaminophen (NORCO/VICODIN) 5-325 MG tablet Take 1 tablet by mouth every 6 (six) hours as needed for moderate pain.  Marland Kitchen lisinopril (ZESTRIL) 30 MG tablet Take 1 tablet (30 mg total) by mouth daily. (Patient taking differently: Take 20 mg by mouth in the morning and at bedtime.)  . Melatonin 5 MG CHEW Chew by mouth.   . metoprolol succinate (TOPROL-XL) 100 MG 24 hr tablet Take 100 mg by mouth in the morning and at bedtime.  . montelukast (SINGULAIR) 10 MG tablet Take 10 mg by mouth daily as needed.  Marland Kitchen omeprazole (PRILOSEC) 20 MG capsule TAKE 1 CAPSULE TWICE DAILY  BEFORE MEALS  . potassium chloride (KLOR-CON) 10 MEQ tablet Take 2 tablets by mouth in the morning and at bedtime.  . Prenatal Vit-Fe Fumarate-FA (MULTIVITAMIN-PRENATAL) 27-0.8 MG TABS tablet Take 1 tablet by mouth daily at 12 noon.  . vitamin C (ASCORBIC ACID) 500 MG tablet Take 500 mg by mouth daily.  . [DISCONTINUED] amLODipine (NORVASC) 10 MG tablet Take 10 mg by mouth daily.    PHQ 2/9 Scores 07/01/2020 04/02/2020 02/13/2020 11/05/2019  PHQ - 2 Score 2 0 4 3  PHQ- 9 Score 2 7 5 7     GAD 7 : Generalized Anxiety Score 07/01/2020 02/13/2020 11/05/2019 10/09/2019  Nervous, Anxious, on Edge 0 1 1 2   Control/stop worrying 0 1 2 2   Worry too much - different things 0 0 2 2  Trouble relaxing 0 0 2 2  Restless 0 0 0 1  Easily annoyed or irritable 0 0 0 1  Afraid - awful might happen 0 0 0 2  Total GAD 7 Score 0 2 7 12   Anxiety Difficulty Not difficult at all Not difficult at all Not difficult at all Not difficult at all    BP Readings from Last 3 Encounters:  07/01/20 136/82  05/08/20 (!) 107/59  04/02/20 136/64    Physical Exam Vitals and nursing note reviewed.  Constitutional:      General: She is not in acute distress.    Appearance: She is well-developed.  HENT:     Head: Normocephalic and atraumatic.  Neck:     Vascular: No carotid bruit.  Cardiovascular:     Rate and Rhythm: Normal rate and regular rhythm. Frequent extrasystoles are present.    Pulses: Normal pulses.     Heart sounds: No murmur heard.   Pulmonary:     Effort: Pulmonary effort is normal. No respiratory distress.     Breath sounds: No wheezing or rhonchi.  Musculoskeletal:     Right lower leg: No edema.     Left lower leg: No edema.  Lymphadenopathy:     Cervical: No cervical adenopathy.  Skin:    General: Skin is warm and dry.     Capillary Refill: Capillary refill takes less than 2 seconds.     Findings: No rash.  Neurological:     General: No focal deficit present.     Mental Status: She is alert  and oriented to person, place, and time.  Psychiatric:        Mood and Affect: Mood normal.        Behavior: Behavior normal.  Wt Readings from Last 3 Encounters:  07/01/20 124 lb (56.2 kg)  05/08/20 121 lb (54.9 kg)  04/02/20 124 lb (56.2 kg)    BP 136/82   Pulse 60   Ht 5' (1.524 m)   Wt 124 lb (56.2 kg)   SpO2 94%   BMI 24.22 kg/m   Assessment and Plan: 1. Essential (primary) hypertension Clinically stable exam with well controlled BP. Tolerating medications without side effects at this time. Pt to continue current regimen and low sodium diet; benefits of regular exercise as able discussed. - amLODipine (NORVASC) 10 MG tablet; Take 1 tablet (10 mg total) by mouth daily.  Dispense: 90 tablet; Refill: 1 - Comprehensive metabolic panel  2. Chronic obstructive pulmonary disease, unspecified COPD type (HCC) Stable, mild symptoms  3. Chronic diastolic CHF (congestive heart failure) (HCC) Stable - follow up with Cardiology  4. Current severe episode of major depressive disorder without psychotic features without prior episode ([place]) Followed by Psych Doing well on current therapy  5. Aortic atherosclerosis ([place]) Would probably benefit from treatment Discuss Repatha with cardiology  6. Paroxysmal atrial fibrillation (HCC) No longer on anticoagulation due to bleeding risk  7. Functional diarrhea Try metamucil to bulk up the stool - dose once a day in AM.   Partially dictated using Editor, commissioning. Any errors are unintentional.  Halina Maidens, MD [facility]  07/01/2020

## 2020-07-02 ENCOUNTER — Telehealth: Payer: Self-pay

## 2020-07-02 LAB — COMPREHENSIVE METABOLIC PANEL
ALT: 21 IU/L (ref 0–32)
AST: 25 IU/L (ref 0–40)
Albumin/Globulin Ratio: 1.4 (ref 1.2–2.2)
Albumin: 4.4 g/dL (ref 3.6–4.6)
Alkaline Phosphatase: 94 IU/L (ref 44–121)
BUN/Creatinine Ratio: 14 (ref 12–28)
BUN: 15 mg/dL (ref 8–27)
Bilirubin Total: 0.3 mg/dL (ref 0.0–1.2)
CO2: 22 mmol/L (ref 20–29)
Calcium: 10.7 mg/dL — ABNORMAL HIGH (ref 8.7–10.3)
Chloride: 96 mmol/L (ref 96–106)
Creatinine, Ser: 1.04 mg/dL — ABNORMAL HIGH (ref 0.57–1.00)
Globulin, Total: 3.1 g/dL (ref 1.5–4.5)
Glucose: 90 mg/dL (ref 65–99)
Potassium: 4 mmol/L (ref 3.5–5.2)
Sodium: 136 mmol/L (ref 134–144)
Total Protein: 7.5 g/dL (ref 6.0–8.5)
eGFR: 53 mL/min/{1.73_m2} — ABNORMAL LOW (ref >59)

## 2020-07-02 NOTE — Telephone Encounter (Signed)
Called pt left VM with normal kidney and liver labs.    Closed the lab result out before I could chart on it.  KP

## 2020-07-16 DIAGNOSIS — E782 Mixed hyperlipidemia: Secondary | ICD-10-CM | POA: Diagnosis not present

## 2020-07-16 DIAGNOSIS — I495 Sick sinus syndrome: Secondary | ICD-10-CM | POA: Insufficient documentation

## 2020-07-16 DIAGNOSIS — I5022 Chronic systolic (congestive) heart failure: Secondary | ICD-10-CM | POA: Diagnosis not present

## 2020-07-16 DIAGNOSIS — I480 Paroxysmal atrial fibrillation: Secondary | ICD-10-CM | POA: Diagnosis not present

## 2020-07-16 DIAGNOSIS — I2510 Atherosclerotic heart disease of native coronary artery without angina pectoris: Secondary | ICD-10-CM | POA: Diagnosis not present

## 2020-07-16 DIAGNOSIS — I10 Essential (primary) hypertension: Secondary | ICD-10-CM | POA: Diagnosis not present

## 2020-07-27 ENCOUNTER — Other Ambulatory Visit: Payer: Self-pay | Admitting: Internal Medicine

## 2020-07-27 DIAGNOSIS — G629 Polyneuropathy, unspecified: Secondary | ICD-10-CM

## 2020-09-08 ENCOUNTER — Other Ambulatory Visit: Payer: Self-pay

## 2020-09-08 MED ORDER — FERROUS GLUCONATE 324 (38 FE) MG PO TABS: 324.0000 mg | ORAL_TABLET | Freq: Every day | ORAL | 1 refills | Status: DC

## 2020-09-21 ENCOUNTER — Other Ambulatory Visit: Payer: Self-pay | Admitting: Internal Medicine

## 2020-09-21 DIAGNOSIS — G629 Polyneuropathy, unspecified: Secondary | ICD-10-CM

## 2020-09-23 NOTE — Telephone Encounter (Signed)
Requested Prescriptions  Pending Prescriptions Disp Refills  . gabapentin (NEURONTIN) 100 MG capsule [Pharmacy Med Name: GABAPENTIN 100 MG Capsule] 180 capsule 1    Sig: TAKE 1 CAPSULE TWICE DAILY  FOR  NEUROPATHY  PAIN.     Neurology: Anticonvulsants - gabapentin Passed - 09/21/2020  9:16 PM      Passed - Valid encounter within last 12 months    Recent Outpatient Visits          2 months ago Essential (primary) hypertension   [facility] Glean Hess, MD   5 months ago Essential (primary) hypertension   [facility] Glean Hess, MD   7 months ago Essential (primary) hypertension   [facility] Glean Hess, MD   10 months ago Essential (primary) hypertension   [facility] Glean Hess, MD   11 months ago Annual physical exam   [facility] Glean Hess, MD

## 2020-09-30 ENCOUNTER — Telehealth: Payer: Self-pay | Admitting: Internal Medicine

## 2020-09-30 NOTE — Telephone Encounter (Signed)
Copied from [place] [id number]. Topic: Medicare AWV >> Sep 30, 2020  9:49 AM Cher Nakai R wrote: Reason for CRM:  Left message for patient to call back and schedule Medicare Annual Wellness Visit (AWV) in office.   If unable to come into the office for AWV,  please offer to do virtually or by telephone.  Last AWV: 09/26/2019  Please schedule at anytime with [facility] Advisor.  40 minute appointment  Any questions, please contact me at [phone]

## 2020-10-09 ENCOUNTER — Other Ambulatory Visit: Payer: Self-pay | Admitting: Internal Medicine

## 2020-10-09 NOTE — Telephone Encounter (Signed)
Medication refilled.  Please schedule patient for follow-up in September per last office visit.

## 2020-10-09 NOTE — Telephone Encounter (Signed)
Requested medication (s) are due for refill today: expired medication  Requested medication (s) are on the active medication list: yes  Last refill:  01/09/20-07/27/20 #60 6 refills  Future visit scheduled: no  Notes to clinic:  expired medication . Last visit 3 months ago. Do you want to renew Rx?    Requested Prescriptions  Pending Prescriptions Disp Refills   fluticasone-salmeterol (ADVAIR) 250-50 MCG/ACT AEPB [Pharmacy Med Name: FLUTICASONE PROPIONATE/SALMETEROL DISKUS 250-50 MCG/ACT Aerosol Powder Breath Activated] 180 each     Sig: INHALE 1 PUFF INTO THE LUNGS 2 (TWO) TIMES DAILY.      Pulmonology:  Combination Products Passed - 10/09/2020  1:13 PM      Passed - Valid encounter within last 12 months    Recent Outpatient Visits           3 months ago Essential (primary) hypertension   [facility] Glean Hess, MD   6 months ago Essential (primary) hypertension   [facility] Glean Hess, MD   7 months ago Essential (primary) hypertension   [facility] Glean Hess, MD   11 months ago Essential (primary) hypertension   [facility] Glean Hess, MD   1 year ago Annual physical exam   [facility] Glean Hess, MD

## 2020-10-11 ENCOUNTER — Other Ambulatory Visit: Payer: Self-pay | Admitting: Internal Medicine

## 2020-10-11 DIAGNOSIS — R1319 Other dysphagia: Secondary | ICD-10-CM

## 2020-10-11 NOTE — Telephone Encounter (Signed)
Requested Prescriptions  Pending Prescriptions Disp Refills  . omeprazole (PRILOSEC) 20 MG capsule [Pharmacy Med Name: OMEPRAZOLE 20 MG Capsule Delayed Release] 180 capsule 1    Sig: TAKE 1 CAPSULE TWICE DAILY BEFORE MEALS     Gastroenterology: Proton Pump Inhibitors Passed - 10/11/2020  3:53 AM      Passed - Valid encounter within last 12 months    Recent Outpatient Visits          3 months ago Essential (primary) hypertension   [facility] Glean Hess, MD   6 months ago Essential (primary) hypertension   [facility] Glean Hess, MD   8 months ago Essential (primary) hypertension   [facility] Glean Hess, MD   11 months ago Essential (primary) hypertension   [facility] Glean Hess, MD   1 year ago Annual physical exam   [facility] Glean Hess, MD

## 2020-10-13 NOTE — Telephone Encounter (Signed)
Left message to call back and set up appt in september

## 2020-11-01 ENCOUNTER — Other Ambulatory Visit: Payer: Self-pay | Admitting: Internal Medicine

## 2020-11-01 DIAGNOSIS — I10 Essential (primary) hypertension: Secondary | ICD-10-CM

## 2020-11-24 ENCOUNTER — Other Ambulatory Visit: Payer: Self-pay | Admitting: Internal Medicine

## 2020-11-24 DIAGNOSIS — F322 Major depressive disorder, single episode, severe without psychotic features: Secondary | ICD-10-CM

## 2020-11-24 DIAGNOSIS — R1319 Other dysphagia: Secondary | ICD-10-CM

## 2020-11-24 MED ORDER — ESCITALOPRAM OXALATE 20 MG PO TABS: 20.0000 mg | ORAL_TABLET | Freq: Every day | ORAL | 1 refills | Status: DC

## 2020-11-24 MED ORDER — MONTELUKAST SODIUM 10 MG PO TABS: 10.0000 mg | ORAL_TABLET | Freq: Every day | ORAL | 1 refills | Status: DC | PRN

## 2020-11-26 ENCOUNTER — Other Ambulatory Visit: Payer: Self-pay | Admitting: Internal Medicine

## 2020-11-26 DIAGNOSIS — I10 Essential (primary) hypertension: Secondary | ICD-10-CM

## 2020-11-26 NOTE — Telephone Encounter (Signed)
Requested Prescriptions  Pending Prescriptions Disp Refills  . amLODipine (NORVASC) 10 MG tablet [Pharmacy Med Name: AMLODIPINE BESYLATE 10 MG Tablet] 90 tablet 0    Sig: TAKE 1 TABLET EVERY DAY     Cardiovascular:  Calcium Channel Blockers Passed - 11/26/2020  3:45 AM      Passed - Last BP in normal range    BP Readings from Last 1 Encounters:  07/01/20 136/82         Passed - Valid encounter within last 6 months    Recent Outpatient Visits          4 months ago Essential (primary) hypertension   [facility] Glean Hess, MD   7 months ago Essential (primary) hypertension   [facility] Glean Hess, MD   9 months ago Essential (primary) hypertension   [facility] Glean Hess, MD   1 year ago Essential (primary) hypertension   [facility] Glean Hess, MD   1 year ago Annual physical exam   [facility] Glean Hess, MD

## 2020-12-22 ENCOUNTER — Ambulatory Visit: Payer: Medicare HMO

## 2021-01-05 ENCOUNTER — Telehealth: Payer: Self-pay | Admitting: Internal Medicine

## 2021-01-05 NOTE — Telephone Encounter (Signed)
Copied from [place] [id number]. Topic: Medicare AWV >> Jan 05, 2021  5:46 PM Cher Nakai R wrote: Reason for CRM:  Left message for patient to call back and schedule Medicare Annual Wellness Visit (AWV) in office.   If unable to come into the office for AWV,  please offer to do virtually or by telephone.  Last AWV:  09/26/2019  Please schedule at anytime with [facility] Health Advisor.  40 minute appointment  Any questions, please contact me at [phone]

## 2021-01-16 ENCOUNTER — Ambulatory Visit (INDEPENDENT_AMBULATORY_CARE_PROVIDER_SITE_OTHER): Payer: Medicare HMO | Admitting: Internal Medicine

## 2021-01-16 ENCOUNTER — Other Ambulatory Visit: Payer: Self-pay

## 2021-01-16 ENCOUNTER — Encounter: Payer: Self-pay | Admitting: Internal Medicine

## 2021-01-16 VITALS — BP 120/84 | HR 93 | Ht 60.0 in | Wt 139.0 lb

## 2021-01-16 DIAGNOSIS — I10 Essential (primary) hypertension: Secondary | ICD-10-CM | POA: Diagnosis not present

## 2021-01-16 DIAGNOSIS — Z23 Encounter for immunization: Secondary | ICD-10-CM

## 2021-01-16 DIAGNOSIS — R1011 Right upper quadrant pain: Secondary | ICD-10-CM | POA: Diagnosis not present

## 2021-01-16 MED ORDER — DICYCLOMINE HCL 10 MG PO CAPS: 10.0000 mg | ORAL_CAPSULE | Freq: Three times a day (TID) | ORAL | 0 refills | Status: DC

## 2021-01-16 MED ORDER — METOPROLOL SUCCINATE ER 200 MG PO TB24: 200.0000 mg | ORAL_TABLET | Freq: Every day | ORAL | 1 refills | Status: DC

## 2021-01-16 NOTE — Progress Notes (Signed)
Date:  01/16/2021   Name:  Jessica Howell   DOB:  1934/08/11   MRN:  [id number]   Chief Complaint: Abdominal Pain  Abdominal Pain This is a chronic problem. The current episode started more than 1 month ago. The onset quality is sudden. The problem occurs daily. The problem has been unchanged. The pain is located in the RUQ. The pain is mild. The quality of the pain is burning. Pertinent negatives include no constipation, diarrhea, fever, nausea or vomiting.  Hypertension This is a chronic problem. The problem is controlled. Pertinent negatives include no chest pain or shortness of breath. Past treatments include beta blockers, calcium channel blockers, diuretics and ACE inhibitors.   09/2019 CTA: IMPRESSION: 1. No acute intrathoracic, abdominal, or pelvic pathology. No CT evidence of aortic dissection or aneurysm. (Specifically normal gall bladder) 2. A 1 cm right upper lobe nodule along the major fissure. Consider one of the following in 3 months for both low-risk and high-risk individuals: (a) repeat chest CT, (b) follow-up PET-CT, or (c) tissue sampling. This recommendation follows the consensus statement: Guidelines for Management of Incidental Pulmonary Nodules Detected on CT Images: From the Fleischner Society 2017; Radiology 2017; 284:228-243. 3. Mild cardiomegaly with advanced 3 vessel coronary vascular calcification. 4. Mildly enlarged right hilar lymph node, nonspecific. Clinical correlation is recommended. 5. A 2 cm heterogeneous right thyroid nodule. Recommend thyroid US (ref: J Am Coll Radiol. 6. Aortic Atherosclerosis (ICD10-I70.0).  09/2019:  Endoscopy: - Normal esophagus. - Normal stomach. - Normal examined duodenum. - No specimens collected.  Lab Results  Component Value Date   CREATININE 1.04 (H) 07/01/2020   BUN 15 07/01/2020   NA 136 07/01/2020   K 4.0 07/01/2020   CL 96 07/01/2020   CO2 22 07/01/2020   Lab Results  Component Value Date   CHOL 227  (H) 10/09/2019   HDL 48 10/09/2019   LDLCALC 156 (H) 10/09/2019   TRIG 129 10/09/2019   CHOLHDL 4.7 (H) 10/09/2019   Lab Results  Component Value Date   TSH 2.840 10/16/2019   Lab Results  Component Value Date   HGBA1C 5.5 02/03/2018   Lab Results  Component Value Date   WBC 11.5 (H) 05/08/2020   HGB 13.8 05/08/2020   HCT 40.1 05/08/2020   MCV 82.0 05/08/2020   PLT 263 05/08/2020   Lab Results  Component Value Date   ALT 21 07/01/2020   AST 25 07/01/2020   ALKPHOS 94 07/01/2020   BILITOT 0.3 07/01/2020     Review of Systems  Constitutional:  Positive for appetite change (eating more). Negative for chills, fatigue and fever.  Respiratory:  Negative for chest tightness and shortness of breath.   Cardiovascular:  Negative for chest pain.  Gastrointestinal:  Positive for abdominal pain. Negative for blood in stool, constipation, diarrhea, nausea and vomiting.  Psychiatric/Behavioral:  Negative for sleep disturbance.    Patient Active Problem List   Diagnosis Date Noted   Sick sinus syndrome ([place]) 07/16/2020   Alzheimer disease ([place]) 02/22/2020   Closed fracture of left hip ([place]) 02/14/2020   Neuropathy 11/05/2019   Aortic atherosclerosis ([place]) 11/05/2019   Chronic diastolic CHF (congestive heart failure) ([place]) 10/18/2019   Hyponatremia 10/18/2019   Pulmonary nodule 1 cm or greater in diameter 10/18/2019   Thyroid nodule greater than or equal to 1.5 cm in diameter incidentally noted on imaging study 10/18/2019   CKD (chronic kidney disease) stage 3, GFR 30-59 ml/min (HCC) 10/18/2019   Microcytic anemia 10/16/2019  Senile osteoporosis 06/11/2019   Current severe episode of major depressive disorder without psychotic features ([place]) 06/11/2019   Myalgia due to statin 06/11/2019   Esophageal dysphagia 05/03/2018   Iron deficiency anemia 03/18/2018   Other age-related cataract 05/31/2016   Paroxysmal atrial fibrillation ([place]) 04/15/2016   Other seborrheic keratosis  01/03/2013   Coronary atherosclerosis of native coronary artery 01/03/2013   Vitamin D deficiency 12/19/2012   Mixed hyperlipidemia 12/19/2012   Essential (primary) hypertension 12/19/2012   Chronic obstructive pulmonary disease, unspecified ([place]) 12/19/2012   Age-related osteoporosis with current pathological fracture with routine healing 12/19/2012    Allergies  Allergen Reactions   Penicillins Anaphylaxis, Rash and Other (See Comments)   Azithromycin Other (See Comments)    abd pain    Gemfibrozil Nausea And Vomiting and Rash        Solifenacin     Mild urinary retention   Statins Nausea And Vomiting and Rash    Stomach pain      Past Surgical History:  Procedure Laterality Date   ABDOMINAL HYSTERECTOMY     BREAST SURGERY     COLONOSCOPY WITH PROPOFOL N/A 10/21/2019   Procedure: COLONOSCOPY WITH PROPOFOL;  Surgeon: Jonathon Bellows, MD;  Location: [facility] ENDOSCOPY;  Service: Gastroenterology;  Laterality: N/A;   ESOPHAGOGASTRODUODENOSCOPY (EGD) WITH PROPOFOL N/A 10/20/2019   Procedure: ESOPHAGOGASTRODUODENOSCOPY (EGD) WITH PROPOFOL;  Surgeon: Lin Landsman, MD;  Location: [facility] ENDOSCOPY;  Service: Gastroenterology;  Laterality: N/A;   FOREARM SURGERY     GIVENS CAPSULE STUDY N/A 10/21/2019   Procedure: GIVENS CAPSULE STUDY;  Surgeon: Jonathon Bellows, MD;  Location: [facility] ENDOSCOPY;  Service: Gastroenterology;  Laterality: N/A;   TREATMENT INTERTROCHANTERIC, PERITROCHANTERIC, OR SUBTROCHANTERIC FEMUR FX; W/INTRAMED IMPLANT W/WO SCREWS Left 01/2020    Social History   Tobacco Use   Smoking status: Former    Packs/day: 0.50    Years: 45.00    Pack years: 22.50    Types: Cigarettes    Quit date: 06/10/2004    Years since quitting: 16.6   Smokeless tobacco: Never  Vaping Use   Vaping Use: Never used  Substance Use Topics   Alcohol use: Never   Drug use: Not Currently     Medication list has been reviewed and updated.  Current Meds  Medication Sig   acetaminophen  (TYLENOL) 325 MG tablet Take 325 mg by mouth every 4 (four) hours as needed.   albuterol (VENTOLIN HFA) 108 (90 Base) MCG/ACT inhaler Inhale 2 puffs into the lungs every 6 (six) hours as needed.   amLODipine (NORVASC) 10 MG tablet TAKE 1 TABLET EVERY DAY   CALCIUM PO Take 600 mg by mouth 2 (two) times daily.   cloNIDine (CATAPRES) 0.2 MG tablet TAKE 2 TABLETS EVERY MORNING AND TAKE 2 TABLETS AT BEDTIME   Cyanocobalamin (VITAMIN B 12 PO) Take 1,000 mcg by mouth daily.   escitalopram (LEXAPRO) 20 MG tablet Take 1 tablet (20 mg total) by mouth daily.   ferrous gluconate (FERGON) 324 MG tablet Take 1 tablet (324 mg total) by mouth daily.   fluticasone (FLONASE) 50 MCG/ACT nasal spray Place 2 sprays into both nostrils daily.   fluticasone-salmeterol (ADVAIR) 250-50 MCG/ACT AEPB INHALE 1 PUFF INTO THE LUNGS 2 (TWO) TIMES DAILY.   furosemide (LASIX) 40 MG tablet TAKE 1 TABLET TWICE DAILY EARLY MORNING AND EARLY AFTERNOON (Patient taking differently: Take 40 mg by mouth daily as needed.)   gabapentin (NEURONTIN) 100 MG capsule TAKE 1 CAPSULE TWICE DAILY  FOR  NEUROPATHY  PAIN.  hydrALAZINE (APRESOLINE) 50 MG tablet Take 50 mg by mouth 3 (three) times daily. prn   HYDROcodone-acetaminophen (NORCO/VICODIN) 5-325 MG tablet Take 1 tablet by mouth every 6 (six) hours as needed for moderate pain.   lisinopril (ZESTRIL) 20 MG tablet    lisinopril (ZESTRIL) 30 MG tablet Take 1 tablet (30 mg total) by mouth daily. (Patient taking differently: Take 20 mg by mouth in the morning and at bedtime.)   Melatonin 5 MG CHEW Chew by mouth.    metoprolol succinate (TOPROL-XL) 100 MG 24 hr tablet Take 100 mg by mouth in the morning and at bedtime.   montelukast (SINGULAIR) 10 MG tablet Take 1 tablet (10 mg total) by mouth daily as needed.   omeprazole (PRILOSEC) 20 MG capsule TAKE 1 CAPSULE TWICE DAILY BEFORE MEALS   potassium chloride (KLOR-CON) 10 MEQ tablet Take 2 tablets by mouth daily as needed.   Prenatal Vit-Fe  Fumarate-FA (MULTIVITAMIN-PRENATAL) 27-0.8 MG TABS tablet Take 1 tablet by mouth daily at 12 noon.   vitamin C (ASCORBIC ACID) 500 MG tablet Take 500 mg by mouth daily.    PHQ 2/9 Scores 07/01/2020 04/02/2020 02/13/2020 11/05/2019  PHQ - 2 Score 2 0 4 3  PHQ- 9 Score 2 7 5 7     GAD 7 : Generalized Anxiety Score 07/01/2020 02/13/2020 11/05/2019 10/09/2019  Nervous, Anxious, on Edge 0 1 1 2   Control/stop worrying 0 1 2 2   Worry too much - different things 0 0 2 2  Trouble relaxing 0 0 2 2  Restless 0 0 0 1  Easily annoyed or irritable 0 0 0 1  Afraid - awful might happen 0 0 0 2  Total GAD 7 Score 0 2 7 12   Anxiety Difficulty Not difficult at all Not difficult at all Not difficult at all Not difficult at all    BP Readings from Last 3 Encounters:  01/16/21 120/84  07/01/20 136/82  05/08/20 (!) 107/59    Physical Exam Vitals and nursing note reviewed.  Constitutional:      General: She is not in acute distress.    Appearance: She is well-developed.  HENT:     Head: Normocephalic and atraumatic.  Cardiovascular:     Rate and Rhythm: Normal rate and regular rhythm.  Pulmonary:     Effort: Pulmonary effort is normal. No respiratory distress.     Breath sounds: Normal breath sounds.  Abdominal:     General: Abdomen is flat. Bowel sounds are normal.     Palpations: Abdomen is soft.     Tenderness: There is abdominal tenderness in the right upper quadrant. There is no right CVA tenderness, left CVA tenderness, guarding or rebound. Negative signs include Murphy's sign.  Skin:    General: Skin is warm and dry.     Findings: No rash.  Neurological:     Mental Status: She is alert and oriented to person, place, and time.  Psychiatric:        Mood and Affect: Mood normal.        Behavior: Behavior normal.    Wt Readings from Last 3 Encounters:  01/16/21 139 lb (63 kg)  07/01/20 124 lb (56.2 kg)  05/08/20 121 lb (54.9 kg)    BP 120/84   Pulse 93   Ht 5' (1.524 m)   Wt 139 lb  (63 kg)   SpO2 96%   BMI 27.15 kg/m   Assessment and Plan: 1. RUQ abdominal pain She has had EGD and ABD CT  in the past year - both showed no abnormality referable to the intestines.  Her sx are not worsening and only occur about once a day.  They are relieved by Gas-Ex. Will give a trial of Bentyl before meals May need to return to GI if persistent - dicyclomine (BENTYL) 10 MG capsule; Take 1 capsule (10 mg total) by mouth 4 (four) times daily -  before meals and at bedtime.  Dispense: 90 capsule; Refill: 0  2. Essential (primary) hypertension Clinically stable exam with well controlled BP. Tolerating medications without side effects at this time. Pt to continue current regimen and low sodium diet; benefits of regular exercise as able discussed. - metoprolol succinate (TOPROL-XL) 200 MG 24 hr tablet; Take 1 tablet (200 mg total) by mouth daily.  Dispense: 90 tablet; Refill: 1  3. Need for immunization against influenza - Flu Vaccine QUAD High Dose(Fluad)   Partially dictated using Editor, commissioning. Any errors are unintentional.  Halina Maidens, MD [facility]  01/16/2021

## 2021-01-20 DIAGNOSIS — I5022 Chronic systolic (congestive) heart failure: Secondary | ICD-10-CM | POA: Diagnosis not present

## 2021-01-20 DIAGNOSIS — E782 Mixed hyperlipidemia: Secondary | ICD-10-CM | POA: Diagnosis not present

## 2021-01-20 DIAGNOSIS — I10 Essential (primary) hypertension: Secondary | ICD-10-CM | POA: Diagnosis not present

## 2021-01-20 DIAGNOSIS — I2510 Atherosclerotic heart disease of native coronary artery without angina pectoris: Secondary | ICD-10-CM | POA: Diagnosis not present

## 2021-01-20 DIAGNOSIS — I480 Paroxysmal atrial fibrillation: Secondary | ICD-10-CM | POA: Diagnosis not present

## 2021-01-27 ENCOUNTER — Other Ambulatory Visit: Payer: Self-pay | Admitting: Internal Medicine

## 2021-02-07 ENCOUNTER — Other Ambulatory Visit: Payer: Self-pay | Admitting: Internal Medicine

## 2021-02-07 DIAGNOSIS — R1011 Right upper quadrant pain: Secondary | ICD-10-CM

## 2021-02-07 NOTE — Telephone Encounter (Signed)
Requested Prescriptions  Pending Prescriptions Disp Refills  . dicyclomine (BENTYL) 10 MG capsule [Pharmacy Med Name: Dicyclomine HCl 10 MG Oral Capsule] 90 capsule 0    Sig: TAKE 1 CAPSULE BY MOUTH 4 TIMES DAILY BEFORE MEAL(S) AND AT BEDTIME     Gastroenterology:  Antispasmodic Agents Passed - 02/07/2021  6:08 PM      Passed - Last Heart Rate in normal range    Pulse Readings from Last 1 Encounters:  01/16/21 93         Passed - Valid encounter within last 12 months    Recent Outpatient Visits          3 weeks ago RUQ abdominal pain   [facility] Glean Hess, MD   7 months ago Essential (primary) hypertension   [facility] Glean [facility], [facility]   10 months ago Essential (primary) hypertension   [facility] Glean Hess, MD   12 months ago Essential (primary) hypertension   [facility] Glean Hess, MD   1 year ago Essential (primary) hypertension   [facility] Glean Hess, MD

## 2021-03-29 ENCOUNTER — Other Ambulatory Visit: Payer: Self-pay | Admitting: Internal Medicine

## 2021-03-29 DIAGNOSIS — I10 Essential (primary) hypertension: Secondary | ICD-10-CM

## 2021-03-29 NOTE — Telephone Encounter (Signed)
Requested Prescriptions  Pending Prescriptions Disp Refills  . cloNIDine (CATAPRES) 0.2 MG tablet [Pharmacy Med Name: CLONIDINE HYDROCHLORIDE 0.2 MG Tablet] 360 tablet 0    Sig: TAKE 2 TABLETS IN THE MORNING AND TAKE TWO TABLETS AT BEDTIME     Cardiovascular:  Alpha-2 Agonists Passed - 03/29/2021  4:41 AM      Passed - Last BP in normal range    BP Readings from Last 1 Encounters:  01/16/21 120/84         Passed - Last Heart Rate in normal range    Pulse Readings from Last 1 Encounters:  01/16/21 93         Passed - Valid encounter within last 6 months    Recent Outpatient Visits          2 months ago RUQ abdominal pain   [facility] Glean Hess, MD   9 months ago Essential (primary) hypertension   [facility] Glean Hess, MD   12 months ago Essential (primary) hypertension   [facility] Glean Hess, MD   1 year ago Essential (primary) hypertension   [facility] Glean Hess, MD   1 year ago Essential (primary) hypertension   [facility] Glean Hess, MD

## 2021-04-11 ENCOUNTER — Other Ambulatory Visit: Payer: Self-pay | Admitting: Internal Medicine

## 2021-04-11 DIAGNOSIS — F322 Major depressive disorder, single episode, severe without psychotic features: Secondary | ICD-10-CM

## 2021-04-11 DIAGNOSIS — R1319 Other dysphagia: Secondary | ICD-10-CM

## 2021-04-12 NOTE — Telephone Encounter (Signed)
Requested Prescriptions  Pending Prescriptions Disp Refills   escitalopram (LEXAPRO) 20 MG tablet [Pharmacy Med Name: ESCITALOPRAM OXALATE 20 MG Tablet] 90 tablet 1    Sig: TAKE 1 TABLET EVERY DAY     Psychiatry:  Antidepressants - SSRI Passed - 04/11/2021  3:31 AM      Passed - Completed PHQ-2 or PHQ-9 in the last 360 days      Passed - Valid encounter within last 6 months    Recent Outpatient Visits          2 months ago RUQ abdominal pain   [facility] Glean Hess, MD   9 months ago Essential (primary) hypertension   [facility] Glean Hess, MD   1 year ago Essential (primary) hypertension   [facility] Glean Hess, MD   1 year ago Essential (primary) hypertension   [facility] Glean Hess, MD   1 year ago Essential (primary) hypertension   [facility] Glean Hess, MD              montelukast (SINGULAIR) 10 MG tablet [Pharmacy Med Name: MONTELUKAST SODIUM 10 MG Tablet] 90 tablet 1    Sig: TAKE 1 TABLET EVERY DAY AS NEEDED     Pulmonology:  Leukotriene Inhibitors Passed - 04/11/2021  3:31 AM      Passed - Valid encounter within last 12 months    Recent Outpatient Visits          2 months ago RUQ abdominal pain   [facility] Glean Hess, MD   9 months ago Essential (primary) hypertension   [facility] Glean Hess, MD   1 year ago Essential (primary) hypertension   [facility] Glean Hess, MD   1 year ago Essential (primary) hypertension   [facility] Glean Hess, MD   1 year ago Essential (primary) hypertension   [facility] Glean Hess, MD

## 2021-04-13 ENCOUNTER — Encounter: Payer: Self-pay | Admitting: Internal Medicine

## 2021-04-15 ENCOUNTER — Other Ambulatory Visit: Payer: Self-pay | Admitting: Internal Medicine

## 2021-04-15 DIAGNOSIS — I10 Essential (primary) hypertension: Secondary | ICD-10-CM

## 2021-04-15 NOTE — Telephone Encounter (Signed)
Requested Prescriptions  Pending Prescriptions Disp Refills   amLODipine (NORVASC) 10 MG tablet [Pharmacy Med Name: AMLODIPINE BESYLATE 10 MG Tablet] 90 tablet 0    Sig: TAKE 1 TABLET EVERY DAY     Cardiovascular:  Calcium Channel Blockers Passed - 04/15/2021  3:35 AM      Passed - Last BP in normal range    BP Readings from Last 1 Encounters:  01/16/21 120/84         Passed - Valid encounter within last 6 months    Recent Outpatient Visits          2 months ago RUQ abdominal pain   [facility] Glean Hess, MD   9 months ago Essential (primary) hypertension   [facility] Glean Hess, MD   1 year ago Essential (primary) hypertension   [facility] Glean Hess, MD   1 year ago Essential (primary) hypertension   [facility] Glean Hess, MD   1 year ago Essential (primary) hypertension   [facility] Glean Hess, MD

## 2021-04-16 ENCOUNTER — Other Ambulatory Visit: Payer: Self-pay | Admitting: Internal Medicine

## 2021-04-16 DIAGNOSIS — R1011 Right upper quadrant pain: Secondary | ICD-10-CM

## 2021-04-16 MED ORDER — DICYCLOMINE HCL 10 MG PO CAPS: ORAL_CAPSULE | ORAL | 3 refills | Status: DC

## 2021-05-21 ENCOUNTER — Other Ambulatory Visit: Payer: Self-pay | Admitting: Internal Medicine

## 2021-05-21 DIAGNOSIS — R1319 Other dysphagia: Secondary | ICD-10-CM

## 2021-05-21 NOTE — Telephone Encounter (Signed)
Requested Prescriptions  Pending Prescriptions Disp Refills   omeprazole (PRILOSEC) 20 MG capsule [Pharmacy Med Name: OMEPRAZOLE 20 MG Capsule Delayed Release] 180 capsule 1    Sig: TAKE 1 CAPSULE TWICE DAILY BEFORE MEALS     Gastroenterology: Proton Pump Inhibitors Passed - 05/21/2021 11:14 AM      Passed - Valid encounter within last 12 months    Recent Outpatient Visits          4 months ago RUQ abdominal pain   [facility] Glean Hess, MD   10 months ago Essential (primary) hypertension   [facility] Glean Hess, MD   1 year ago Essential (primary) hypertension   [facility] Glean Hess, MD   1 year ago Essential (primary) hypertension   [facility] Glean Hess, MD   1 year ago Essential (primary) hypertension   [facility] Glean Hess, MD

## 2021-06-22 ENCOUNTER — Other Ambulatory Visit: Payer: Self-pay

## 2021-06-22 DIAGNOSIS — R1011 Right upper quadrant pain: Secondary | ICD-10-CM

## 2021-06-22 MED ORDER — DICYCLOMINE HCL 10 MG PO CAPS: ORAL_CAPSULE | ORAL | 2 refills | Status: DC

## 2021-08-24 ENCOUNTER — Other Ambulatory Visit: Payer: Self-pay | Admitting: Internal Medicine

## 2021-08-24 DIAGNOSIS — I10 Essential (primary) hypertension: Secondary | ICD-10-CM

## 2021-09-03 DIAGNOSIS — I248 Other forms of acute ischemic heart disease: Secondary | ICD-10-CM | POA: Diagnosis not present

## 2021-09-03 DIAGNOSIS — E8770 Fluid overload, unspecified: Secondary | ICD-10-CM | POA: Diagnosis not present

## 2021-09-03 DIAGNOSIS — Z66 Do not resuscitate: Secondary | ICD-10-CM | POA: Diagnosis not present

## 2021-09-03 DIAGNOSIS — I10 Essential (primary) hypertension: Secondary | ICD-10-CM | POA: Diagnosis not present

## 2021-09-03 DIAGNOSIS — I4891 Unspecified atrial fibrillation: Secondary | ICD-10-CM | POA: Diagnosis not present

## 2021-09-03 DIAGNOSIS — N183 Chronic kidney disease, stage 3 unspecified: Secondary | ICD-10-CM | POA: Diagnosis not present

## 2021-09-03 DIAGNOSIS — J810 Acute pulmonary edema: Secondary | ICD-10-CM | POA: Diagnosis not present

## 2021-09-03 DIAGNOSIS — M25552 Pain in left hip: Secondary | ICD-10-CM | POA: Diagnosis not present

## 2021-09-03 DIAGNOSIS — M545 Low back pain, unspecified: Secondary | ICD-10-CM | POA: Diagnosis not present

## 2021-09-03 DIAGNOSIS — E559 Vitamin D deficiency, unspecified: Secondary | ICD-10-CM | POA: Diagnosis not present

## 2021-09-03 DIAGNOSIS — R0902 Hypoxemia: Secondary | ICD-10-CM | POA: Diagnosis not present

## 2021-09-03 DIAGNOSIS — M25532 Pain in left wrist: Secondary | ICD-10-CM | POA: Diagnosis not present

## 2021-09-03 DIAGNOSIS — I5031 Acute diastolic (congestive) heart failure: Secondary | ICD-10-CM | POA: Diagnosis not present

## 2021-09-03 DIAGNOSIS — M25512 Pain in left shoulder: Secondary | ICD-10-CM | POA: Diagnosis not present

## 2021-09-03 DIAGNOSIS — R531 Weakness: Secondary | ICD-10-CM | POA: Diagnosis not present

## 2021-09-03 DIAGNOSIS — F0280 Dementia in other diseases classified elsewhere without behavioral disturbance: Secondary | ICD-10-CM | POA: Diagnosis not present

## 2021-09-03 DIAGNOSIS — S3992XA Unspecified injury of lower back, initial encounter: Secondary | ICD-10-CM | POA: Diagnosis not present

## 2021-09-03 DIAGNOSIS — F32A Depression, unspecified: Secondary | ICD-10-CM | POA: Diagnosis not present

## 2021-09-03 DIAGNOSIS — I5021 Acute systolic (congestive) heart failure: Secondary | ICD-10-CM | POA: Diagnosis not present

## 2021-09-03 DIAGNOSIS — I110 Hypertensive heart disease with heart failure: Secondary | ICD-10-CM | POA: Diagnosis not present

## 2021-09-03 DIAGNOSIS — Z6828 Body mass index (BMI) 28.0-28.9, adult: Secondary | ICD-10-CM | POA: Diagnosis not present

## 2021-09-03 DIAGNOSIS — R918 Other nonspecific abnormal finding of lung field: Secondary | ICD-10-CM | POA: Diagnosis not present

## 2021-09-03 DIAGNOSIS — S4992XA Unspecified injury of left shoulder and upper arm, initial encounter: Secondary | ICD-10-CM | POA: Diagnosis not present

## 2021-09-03 DIAGNOSIS — J9621 Acute and chronic respiratory failure with hypoxia: Secondary | ICD-10-CM | POA: Diagnosis not present

## 2021-09-03 DIAGNOSIS — D72829 Elevated white blood cell count, unspecified: Secondary | ICD-10-CM | POA: Diagnosis not present

## 2021-09-03 DIAGNOSIS — E782 Mixed hyperlipidemia: Secondary | ICD-10-CM | POA: Diagnosis not present

## 2021-09-03 DIAGNOSIS — I130 Hypertensive heart and chronic kidney disease with heart failure and stage 1 through stage 4 chronic kidney disease, or unspecified chronic kidney disease: Secondary | ICD-10-CM | POA: Diagnosis not present

## 2021-09-03 DIAGNOSIS — S42202A Unspecified fracture of upper end of left humerus, initial encounter for closed fracture: Secondary | ICD-10-CM | POA: Diagnosis not present

## 2021-09-03 DIAGNOSIS — I480 Paroxysmal atrial fibrillation: Secondary | ICD-10-CM | POA: Diagnosis not present

## 2021-09-03 DIAGNOSIS — J9811 Atelectasis: Secondary | ICD-10-CM | POA: Diagnosis not present

## 2021-09-03 DIAGNOSIS — R52 Pain, unspecified: Secondary | ICD-10-CM | POA: Diagnosis not present

## 2021-09-03 DIAGNOSIS — Z6823 Body mass index (BMI) 23.0-23.9, adult: Secondary | ICD-10-CM | POA: Diagnosis not present

## 2021-09-03 DIAGNOSIS — N179 Acute kidney failure, unspecified: Secondary | ICD-10-CM | POA: Diagnosis not present

## 2021-09-03 DIAGNOSIS — J811 Chronic pulmonary edema: Secondary | ICD-10-CM | POA: Diagnosis not present

## 2021-09-03 DIAGNOSIS — N281 Cyst of kidney, acquired: Secondary | ICD-10-CM | POA: Diagnosis not present

## 2021-09-03 DIAGNOSIS — K219 Gastro-esophageal reflux disease without esophagitis: Secondary | ICD-10-CM | POA: Diagnosis not present

## 2021-09-03 DIAGNOSIS — G309 Alzheimer's disease, unspecified: Secondary | ICD-10-CM | POA: Diagnosis not present

## 2021-09-03 DIAGNOSIS — M25531 Pain in right wrist: Secondary | ICD-10-CM | POA: Diagnosis not present

## 2021-09-03 DIAGNOSIS — M6250 Muscle wasting and atrophy, not elsewhere classified, unspecified site: Secondary | ICD-10-CM | POA: Diagnosis not present

## 2021-09-03 DIAGNOSIS — I161 Hypertensive emergency: Secondary | ICD-10-CM | POA: Diagnosis not present

## 2021-09-03 DIAGNOSIS — M810 Age-related osteoporosis without current pathological fracture: Secondary | ICD-10-CM | POA: Diagnosis not present

## 2021-09-03 DIAGNOSIS — W19XXXA Unspecified fall, initial encounter: Secondary | ICD-10-CM | POA: Diagnosis not present

## 2021-09-03 DIAGNOSIS — I509 Heart failure, unspecified: Secondary | ICD-10-CM | POA: Diagnosis not present

## 2021-09-03 DIAGNOSIS — R7989 Other specified abnormal findings of blood chemistry: Secondary | ICD-10-CM | POA: Diagnosis not present

## 2021-09-03 DIAGNOSIS — Z79899 Other long term (current) drug therapy: Secondary | ICD-10-CM | POA: Diagnosis not present

## 2021-09-03 DIAGNOSIS — M150 Primary generalized (osteo)arthritis: Secondary | ICD-10-CM | POA: Diagnosis not present

## 2021-09-03 DIAGNOSIS — J449 Chronic obstructive pulmonary disease, unspecified: Secondary | ICD-10-CM | POA: Diagnosis not present

## 2021-09-03 DIAGNOSIS — I272 Pulmonary hypertension, unspecified: Secondary | ICD-10-CM | POA: Diagnosis not present

## 2021-09-03 DIAGNOSIS — I5023 Acute on chronic systolic (congestive) heart failure: Secondary | ICD-10-CM | POA: Diagnosis not present

## 2021-09-03 DIAGNOSIS — Z7401 Bed confinement status: Secondary | ICD-10-CM | POA: Diagnosis not present

## 2021-09-04 DIAGNOSIS — E8770 Fluid overload, unspecified: Secondary | ICD-10-CM | POA: Diagnosis not present

## 2021-09-04 DIAGNOSIS — I509 Heart failure, unspecified: Secondary | ICD-10-CM | POA: Diagnosis not present

## 2021-09-05 ENCOUNTER — Other Ambulatory Visit: Payer: Self-pay | Admitting: Internal Medicine

## 2021-09-05 DIAGNOSIS — R1319 Other dysphagia: Secondary | ICD-10-CM

## 2021-09-05 DIAGNOSIS — I10 Essential (primary) hypertension: Secondary | ICD-10-CM

## 2021-09-05 DIAGNOSIS — F322 Major depressive disorder, single episode, severe without psychotic features: Secondary | ICD-10-CM

## 2021-09-07 NOTE — Telephone Encounter (Signed)
Called pt's number and spoke with daughter/HC POA Haynes Dage. Called to make the pt an appointment. Hilda Blades advised that pt is hospitalized and not to refill medications at this time. All meds refused. ?

## 2021-09-10 DIAGNOSIS — E782 Mixed hyperlipidemia: Secondary | ICD-10-CM | POA: Diagnosis not present

## 2021-09-10 DIAGNOSIS — J449 Chronic obstructive pulmonary disease, unspecified: Secondary | ICD-10-CM | POA: Diagnosis not present

## 2021-09-10 DIAGNOSIS — J9621 Acute and chronic respiratory failure with hypoxia: Secondary | ICD-10-CM | POA: Diagnosis not present

## 2021-09-10 DIAGNOSIS — I509 Heart failure, unspecified: Secondary | ICD-10-CM | POA: Diagnosis not present

## 2021-09-10 DIAGNOSIS — I5020 Unspecified systolic (congestive) heart failure: Secondary | ICD-10-CM | POA: Diagnosis not present

## 2021-09-10 DIAGNOSIS — M150 Primary generalized (osteo)arthritis: Secondary | ICD-10-CM | POA: Diagnosis not present

## 2021-09-10 DIAGNOSIS — I2510 Atherosclerotic heart disease of native coronary artery without angina pectoris: Secondary | ICD-10-CM | POA: Diagnosis not present

## 2021-09-10 DIAGNOSIS — M25512 Pain in left shoulder: Secondary | ICD-10-CM | POA: Diagnosis not present

## 2021-09-10 DIAGNOSIS — Z9181 History of falling: Secondary | ICD-10-CM | POA: Diagnosis not present

## 2021-09-10 DIAGNOSIS — N183 Chronic kidney disease, stage 3 unspecified: Secondary | ICD-10-CM | POA: Diagnosis not present

## 2021-09-10 DIAGNOSIS — N179 Acute kidney failure, unspecified: Secondary | ICD-10-CM | POA: Diagnosis not present

## 2021-09-10 DIAGNOSIS — R519 Headache, unspecified: Secondary | ICD-10-CM | POA: Diagnosis not present

## 2021-09-10 DIAGNOSIS — I5032 Chronic diastolic (congestive) heart failure: Secondary | ICD-10-CM | POA: Diagnosis not present

## 2021-09-10 DIAGNOSIS — M6250 Muscle wasting and atrophy, not elsewhere classified, unspecified site: Secondary | ICD-10-CM | POA: Diagnosis not present

## 2021-09-10 DIAGNOSIS — R52 Pain, unspecified: Secondary | ICD-10-CM | POA: Diagnosis not present

## 2021-09-10 DIAGNOSIS — R531 Weakness: Secondary | ICD-10-CM | POA: Diagnosis not present

## 2021-09-10 DIAGNOSIS — I5021 Acute systolic (congestive) heart failure: Secondary | ICD-10-CM | POA: Diagnosis not present

## 2021-09-10 DIAGNOSIS — G8929 Other chronic pain: Secondary | ICD-10-CM | POA: Diagnosis not present

## 2021-09-10 DIAGNOSIS — E559 Vitamin D deficiency, unspecified: Secondary | ICD-10-CM | POA: Diagnosis not present

## 2021-09-10 DIAGNOSIS — M25552 Pain in left hip: Secondary | ICD-10-CM | POA: Diagnosis not present

## 2021-09-10 DIAGNOSIS — J9691 Respiratory failure, unspecified with hypoxia: Secondary | ICD-10-CM | POA: Diagnosis not present

## 2021-09-10 DIAGNOSIS — J810 Acute pulmonary edema: Secondary | ICD-10-CM | POA: Diagnosis not present

## 2021-09-10 DIAGNOSIS — Z7401 Bed confinement status: Secondary | ICD-10-CM | POA: Diagnosis not present

## 2021-09-10 DIAGNOSIS — I10 Essential (primary) hypertension: Secondary | ICD-10-CM | POA: Diagnosis not present

## 2021-09-10 DIAGNOSIS — S72002A Fracture of unspecified part of neck of left femur, initial encounter for closed fracture: Secondary | ICD-10-CM | POA: Diagnosis not present

## 2021-09-10 DIAGNOSIS — I480 Paroxysmal atrial fibrillation: Secondary | ICD-10-CM | POA: Diagnosis not present

## 2021-09-10 DIAGNOSIS — M810 Age-related osteoporosis without current pathological fracture: Secondary | ICD-10-CM | POA: Diagnosis not present

## 2021-09-10 DIAGNOSIS — I130 Hypertensive heart and chronic kidney disease with heart failure and stage 1 through stage 4 chronic kidney disease, or unspecified chronic kidney disease: Secondary | ICD-10-CM | POA: Diagnosis not present

## 2021-09-14 DIAGNOSIS — M25552 Pain in left hip: Secondary | ICD-10-CM | POA: Diagnosis not present

## 2021-09-14 DIAGNOSIS — Z9181 History of falling: Secondary | ICD-10-CM | POA: Diagnosis not present

## 2021-09-14 DIAGNOSIS — M25512 Pain in left shoulder: Secondary | ICD-10-CM | POA: Diagnosis not present

## 2021-09-14 DIAGNOSIS — J9691 Respiratory failure, unspecified with hypoxia: Secondary | ICD-10-CM | POA: Diagnosis not present

## 2021-09-14 DIAGNOSIS — N179 Acute kidney failure, unspecified: Secondary | ICD-10-CM | POA: Diagnosis not present

## 2021-09-14 DIAGNOSIS — I5020 Unspecified systolic (congestive) heart failure: Secondary | ICD-10-CM | POA: Diagnosis not present

## 2021-09-15 DIAGNOSIS — G8929 Other chronic pain: Secondary | ICD-10-CM | POA: Diagnosis not present

## 2021-09-15 DIAGNOSIS — R52 Pain, unspecified: Secondary | ICD-10-CM | POA: Diagnosis not present

## 2021-09-16 DIAGNOSIS — J9621 Acute and chronic respiratory failure with hypoxia: Secondary | ICD-10-CM | POA: Diagnosis not present

## 2021-09-16 DIAGNOSIS — I5032 Chronic diastolic (congestive) heart failure: Secondary | ICD-10-CM | POA: Diagnosis not present

## 2021-09-16 DIAGNOSIS — R52 Pain, unspecified: Secondary | ICD-10-CM | POA: Diagnosis not present

## 2021-09-16 DIAGNOSIS — S72002A Fracture of unspecified part of neck of left femur, initial encounter for closed fracture: Secondary | ICD-10-CM | POA: Diagnosis not present

## 2021-09-22 DIAGNOSIS — G8929 Other chronic pain: Secondary | ICD-10-CM | POA: Diagnosis not present

## 2021-09-22 DIAGNOSIS — R52 Pain, unspecified: Secondary | ICD-10-CM | POA: Diagnosis not present

## 2021-09-24 DIAGNOSIS — M25552 Pain in left hip: Secondary | ICD-10-CM | POA: Diagnosis not present

## 2021-09-24 DIAGNOSIS — I5020 Unspecified systolic (congestive) heart failure: Secondary | ICD-10-CM | POA: Diagnosis not present

## 2021-09-24 DIAGNOSIS — J810 Acute pulmonary edema: Secondary | ICD-10-CM | POA: Diagnosis not present

## 2021-09-24 DIAGNOSIS — M25512 Pain in left shoulder: Secondary | ICD-10-CM | POA: Diagnosis not present

## 2021-09-24 DIAGNOSIS — N179 Acute kidney failure, unspecified: Secondary | ICD-10-CM | POA: Diagnosis not present

## 2021-09-26 DIAGNOSIS — R52 Pain, unspecified: Secondary | ICD-10-CM | POA: Diagnosis not present

## 2021-09-26 DIAGNOSIS — R531 Weakness: Secondary | ICD-10-CM | POA: Diagnosis not present

## 2021-09-26 DIAGNOSIS — G8929 Other chronic pain: Secondary | ICD-10-CM | POA: Diagnosis not present

## 2021-09-26 DIAGNOSIS — R519 Headache, unspecified: Secondary | ICD-10-CM | POA: Diagnosis not present

## 2021-09-29 DIAGNOSIS — R519 Headache, unspecified: Secondary | ICD-10-CM | POA: Diagnosis not present

## 2021-09-29 DIAGNOSIS — G8929 Other chronic pain: Secondary | ICD-10-CM | POA: Diagnosis not present

## 2021-09-29 DIAGNOSIS — R52 Pain, unspecified: Secondary | ICD-10-CM | POA: Diagnosis not present

## 2021-09-29 DIAGNOSIS — R531 Weakness: Secondary | ICD-10-CM | POA: Diagnosis not present

## 2021-09-30 DIAGNOSIS — I2510 Atherosclerotic heart disease of native coronary artery without angina pectoris: Secondary | ICD-10-CM | POA: Diagnosis not present

## 2021-09-30 DIAGNOSIS — I5020 Unspecified systolic (congestive) heart failure: Secondary | ICD-10-CM | POA: Diagnosis not present

## 2021-09-30 DIAGNOSIS — I480 Paroxysmal atrial fibrillation: Secondary | ICD-10-CM | POA: Diagnosis not present

## 2021-09-30 DIAGNOSIS — I10 Essential (primary) hypertension: Secondary | ICD-10-CM | POA: Diagnosis not present

## 2021-10-10 DIAGNOSIS — R519 Headache, unspecified: Secondary | ICD-10-CM | POA: Diagnosis not present

## 2021-10-10 DIAGNOSIS — G8929 Other chronic pain: Secondary | ICD-10-CM | POA: Diagnosis not present

## 2021-10-10 DIAGNOSIS — R531 Weakness: Secondary | ICD-10-CM | POA: Diagnosis not present

## 2021-10-10 DIAGNOSIS — R52 Pain, unspecified: Secondary | ICD-10-CM | POA: Diagnosis not present

## 2021-10-12 DIAGNOSIS — I2510 Atherosclerotic heart disease of native coronary artery without angina pectoris: Secondary | ICD-10-CM | POA: Diagnosis not present

## 2021-10-12 DIAGNOSIS — M25512 Pain in left shoulder: Secondary | ICD-10-CM | POA: Diagnosis not present

## 2021-10-12 DIAGNOSIS — Z9181 History of falling: Secondary | ICD-10-CM | POA: Diagnosis not present

## 2021-10-12 DIAGNOSIS — I480 Paroxysmal atrial fibrillation: Secondary | ICD-10-CM | POA: Diagnosis not present

## 2021-10-12 DIAGNOSIS — I10 Essential (primary) hypertension: Secondary | ICD-10-CM | POA: Diagnosis not present

## 2021-10-12 DIAGNOSIS — J9691 Respiratory failure, unspecified with hypoxia: Secondary | ICD-10-CM | POA: Diagnosis not present

## 2021-10-12 DIAGNOSIS — I5020 Unspecified systolic (congestive) heart failure: Secondary | ICD-10-CM | POA: Diagnosis not present

## 2021-10-13 DIAGNOSIS — S32512A Fracture of superior rim of left pubis, initial encounter for closed fracture: Secondary | ICD-10-CM | POA: Diagnosis not present

## 2021-10-13 DIAGNOSIS — Z20822 Contact with and (suspected) exposure to covid-19: Secondary | ICD-10-CM | POA: Diagnosis not present

## 2021-10-13 DIAGNOSIS — M25552 Pain in left hip: Secondary | ICD-10-CM | POA: Diagnosis not present

## 2021-10-13 DIAGNOSIS — M542 Cervicalgia: Secondary | ICD-10-CM | POA: Diagnosis not present

## 2021-10-13 DIAGNOSIS — S3219XA Other fracture of sacrum, initial encounter for closed fracture: Secondary | ICD-10-CM | POA: Diagnosis not present

## 2021-10-13 DIAGNOSIS — S32592A Other specified fracture of left pubis, initial encounter for closed fracture: Secondary | ICD-10-CM | POA: Diagnosis not present

## 2021-10-13 DIAGNOSIS — W19XXXA Unspecified fall, initial encounter: Secondary | ICD-10-CM | POA: Diagnosis not present

## 2021-10-13 DIAGNOSIS — R2689 Other abnormalities of gait and mobility: Secondary | ICD-10-CM | POA: Diagnosis not present

## 2021-10-13 DIAGNOSIS — S0003XA Contusion of scalp, initial encounter: Secondary | ICD-10-CM | POA: Diagnosis not present

## 2021-10-13 DIAGNOSIS — Z6826 Body mass index (BMI) 26.0-26.9, adult: Secondary | ICD-10-CM | POA: Diagnosis not present

## 2021-10-13 DIAGNOSIS — S3282XA Multiple fractures of pelvis without disruption of pelvic ring, initial encounter for closed fracture: Secondary | ICD-10-CM | POA: Diagnosis not present

## 2021-10-13 DIAGNOSIS — S60212A Contusion of left wrist, initial encounter: Secondary | ICD-10-CM | POA: Diagnosis not present

## 2021-10-13 DIAGNOSIS — M79605 Pain in left leg: Secondary | ICD-10-CM | POA: Diagnosis not present

## 2021-10-14 DIAGNOSIS — S3282XA Multiple fractures of pelvis without disruption of pelvic ring, initial encounter for closed fracture: Secondary | ICD-10-CM | POA: Diagnosis not present

## 2021-10-14 DIAGNOSIS — I4891 Unspecified atrial fibrillation: Secondary | ICD-10-CM | POA: Diagnosis not present

## 2021-10-14 DIAGNOSIS — K219 Gastro-esophageal reflux disease without esophagitis: Secondary | ICD-10-CM | POA: Diagnosis not present

## 2021-10-14 DIAGNOSIS — I10 Essential (primary) hypertension: Secondary | ICD-10-CM | POA: Diagnosis not present

## 2021-10-14 DIAGNOSIS — M810 Age-related osteoporosis without current pathological fracture: Secondary | ICD-10-CM | POA: Diagnosis not present

## 2021-10-14 DIAGNOSIS — S32512A Fracture of superior rim of left pubis, initial encounter for closed fracture: Secondary | ICD-10-CM | POA: Diagnosis not present

## 2021-10-14 DIAGNOSIS — S32591A Other specified fracture of right pubis, initial encounter for closed fracture: Secondary | ICD-10-CM | POA: Insufficient documentation

## 2021-10-14 DIAGNOSIS — Z87891 Personal history of nicotine dependence: Secondary | ICD-10-CM | POA: Diagnosis not present

## 2021-10-14 DIAGNOSIS — J449 Chronic obstructive pulmonary disease, unspecified: Secondary | ICD-10-CM | POA: Diagnosis not present

## 2021-10-14 DIAGNOSIS — F32A Depression, unspecified: Secondary | ICD-10-CM | POA: Diagnosis not present

## 2021-10-15 DIAGNOSIS — I4891 Unspecified atrial fibrillation: Secondary | ICD-10-CM | POA: Diagnosis not present

## 2021-10-15 DIAGNOSIS — N179 Acute kidney failure, unspecified: Secondary | ICD-10-CM | POA: Diagnosis not present

## 2021-10-15 DIAGNOSIS — F32A Depression, unspecified: Secondary | ICD-10-CM | POA: Diagnosis not present

## 2021-10-15 DIAGNOSIS — M810 Age-related osteoporosis without current pathological fracture: Secondary | ICD-10-CM | POA: Diagnosis not present

## 2021-10-15 DIAGNOSIS — S3282XA Multiple fractures of pelvis without disruption of pelvic ring, initial encounter for closed fracture: Secondary | ICD-10-CM | POA: Diagnosis not present

## 2021-10-15 DIAGNOSIS — K219 Gastro-esophageal reflux disease without esophagitis: Secondary | ICD-10-CM | POA: Diagnosis not present

## 2021-10-15 DIAGNOSIS — H9190 Unspecified hearing loss, unspecified ear: Secondary | ICD-10-CM | POA: Diagnosis not present

## 2021-10-15 DIAGNOSIS — J449 Chronic obstructive pulmonary disease, unspecified: Secondary | ICD-10-CM | POA: Diagnosis not present

## 2021-10-15 DIAGNOSIS — I10 Essential (primary) hypertension: Secondary | ICD-10-CM | POA: Diagnosis not present

## 2021-10-16 DIAGNOSIS — M810 Age-related osteoporosis without current pathological fracture: Secondary | ICD-10-CM | POA: Diagnosis not present

## 2021-10-16 DIAGNOSIS — Z20822 Contact with and (suspected) exposure to covid-19: Secondary | ICD-10-CM | POA: Diagnosis not present

## 2021-10-16 DIAGNOSIS — M79605 Pain in left leg: Secondary | ICD-10-CM | POA: Diagnosis not present

## 2021-10-16 DIAGNOSIS — E559 Vitamin D deficiency, unspecified: Secondary | ICD-10-CM | POA: Diagnosis not present

## 2021-10-16 DIAGNOSIS — I10 Essential (primary) hypertension: Secondary | ICD-10-CM | POA: Diagnosis not present

## 2021-10-16 DIAGNOSIS — J449 Chronic obstructive pulmonary disease, unspecified: Secondary | ICD-10-CM | POA: Diagnosis not present

## 2021-10-16 DIAGNOSIS — R059 Cough, unspecified: Secondary | ICD-10-CM | POA: Diagnosis not present

## 2021-10-16 DIAGNOSIS — R269 Unspecified abnormalities of gait and mobility: Secondary | ICD-10-CM | POA: Diagnosis not present

## 2021-10-16 DIAGNOSIS — E782 Mixed hyperlipidemia: Secondary | ICD-10-CM | POA: Diagnosis not present

## 2021-10-16 DIAGNOSIS — R5381 Other malaise: Secondary | ICD-10-CM | POA: Diagnosis not present

## 2021-10-16 DIAGNOSIS — M542 Cervicalgia: Secondary | ICD-10-CM | POA: Diagnosis not present

## 2021-10-16 DIAGNOSIS — M150 Primary generalized (osteo)arthritis: Secondary | ICD-10-CM | POA: Diagnosis not present

## 2021-10-16 DIAGNOSIS — M25552 Pain in left hip: Secondary | ICD-10-CM | POA: Diagnosis not present

## 2021-10-16 DIAGNOSIS — S32591A Other specified fracture of right pubis, initial encounter for closed fracture: Secondary | ICD-10-CM | POA: Diagnosis not present

## 2021-10-16 DIAGNOSIS — S0003XA Contusion of scalp, initial encounter: Secondary | ICD-10-CM | POA: Diagnosis not present

## 2021-10-16 DIAGNOSIS — R2689 Other abnormalities of gait and mobility: Secondary | ICD-10-CM | POA: Diagnosis not present

## 2021-10-16 DIAGNOSIS — S32512A Fracture of superior rim of left pubis, initial encounter for closed fracture: Secondary | ICD-10-CM | POA: Diagnosis not present

## 2021-10-16 DIAGNOSIS — M6281 Muscle weakness (generalized): Secondary | ICD-10-CM | POA: Diagnosis not present

## 2021-10-16 DIAGNOSIS — S3219XA Other fracture of sacrum, initial encounter for closed fracture: Secondary | ICD-10-CM | POA: Diagnosis not present

## 2021-10-16 DIAGNOSIS — J42 Unspecified chronic bronchitis: Secondary | ICD-10-CM | POA: Diagnosis not present

## 2021-10-16 DIAGNOSIS — S32592A Other specified fracture of left pubis, initial encounter for closed fracture: Secondary | ICD-10-CM | POA: Diagnosis not present

## 2021-10-16 DIAGNOSIS — Z20828 Contact with and (suspected) exposure to other viral communicable diseases: Secondary | ICD-10-CM | POA: Diagnosis not present

## 2021-10-16 DIAGNOSIS — S32509D Unspecified fracture of unspecified pubis, subsequent encounter for fracture with routine healing: Secondary | ICD-10-CM | POA: Diagnosis not present

## 2021-10-16 DIAGNOSIS — E785 Hyperlipidemia, unspecified: Secondary | ICD-10-CM | POA: Diagnosis not present

## 2021-10-16 DIAGNOSIS — I509 Heart failure, unspecified: Secondary | ICD-10-CM | POA: Diagnosis not present

## 2021-10-16 DIAGNOSIS — I517 Cardiomegaly: Secondary | ICD-10-CM | POA: Diagnosis not present

## 2021-10-18 DIAGNOSIS — R5381 Other malaise: Secondary | ICD-10-CM | POA: Diagnosis not present

## 2021-10-19 ENCOUNTER — Ambulatory Visit: Payer: Medicare HMO | Admitting: Internal Medicine

## 2021-10-19 ENCOUNTER — Telehealth: Payer: Self-pay | Admitting: Internal Medicine

## 2021-10-19 DIAGNOSIS — R5381 Other malaise: Secondary | ICD-10-CM | POA: Diagnosis not present

## 2021-10-22 DIAGNOSIS — R5381 Other malaise: Secondary | ICD-10-CM | POA: Diagnosis not present

## 2021-10-23 DIAGNOSIS — R5381 Other malaise: Secondary | ICD-10-CM | POA: Diagnosis not present

## 2021-10-26 DIAGNOSIS — R5381 Other malaise: Secondary | ICD-10-CM | POA: Diagnosis not present

## 2021-10-26 DIAGNOSIS — I10 Essential (primary) hypertension: Secondary | ICD-10-CM | POA: Diagnosis not present

## 2021-10-30 DIAGNOSIS — R5381 Other malaise: Secondary | ICD-10-CM | POA: Diagnosis not present

## 2021-11-02 DIAGNOSIS — R5381 Other malaise: Secondary | ICD-10-CM | POA: Diagnosis not present

## 2021-11-09 ENCOUNTER — Encounter: Payer: Self-pay | Admitting: Internal Medicine

## 2021-11-11 ENCOUNTER — Encounter: Payer: Self-pay | Admitting: Internal Medicine

## 2021-11-11 ENCOUNTER — Ambulatory Visit (INDEPENDENT_AMBULATORY_CARE_PROVIDER_SITE_OTHER): Payer: Medicare HMO | Admitting: Internal Medicine

## 2021-11-11 VITALS — BP 142/76 | HR 82 | Ht 60.0 in | Wt 139.0 lb

## 2021-11-11 DIAGNOSIS — J449 Chronic obstructive pulmonary disease, unspecified: Secondary | ICD-10-CM

## 2021-11-11 DIAGNOSIS — G44219 Episodic tension-type headache, not intractable: Secondary | ICD-10-CM

## 2021-11-11 DIAGNOSIS — S32592A Other specified fracture of left pubis, initial encounter for closed fracture: Secondary | ICD-10-CM

## 2021-11-11 DIAGNOSIS — I10 Essential (primary) hypertension: Secondary | ICD-10-CM | POA: Diagnosis not present

## 2021-11-11 DIAGNOSIS — S32591A Other specified fracture of right pubis, initial encounter for closed fracture: Secondary | ICD-10-CM | POA: Diagnosis not present

## 2021-11-11 MED ORDER — ALBUTEROL SULFATE (2.5 MG/3ML) 0.083% IN NEBU: 2.5000 mg | INHALATION_SOLUTION | Freq: Four times a day (QID) | RESPIRATORY_TRACT | 1 refills | Status: DC | PRN

## 2021-11-11 MED ORDER — BUDESONIDE 0.5 MG/2ML IN SUSP: 0.5000 mg | Freq: Two times a day (BID) | RESPIRATORY_TRACT | 1 refills | Status: DC

## 2021-11-11 MED ORDER — CLONIDINE HCL 0.1 MG PO TABS: 0.1000 mg | ORAL_TABLET | Freq: Two times a day (BID) | ORAL | 1 refills | Status: DC

## 2021-11-11 NOTE — Progress Notes (Signed)
Date:  11/11/2021   Name:  Jessica Howell   DOB:  [birth year]   MRN:  [id number]   Chief Complaint: Ear Fullness and COPD  Ear Fullness  There is pain in both ears. This is a new problem. The problem has been gradually worsening (hearing seems to be decreased). Associated symptoms include coughing, headaches and hearing loss. Pertinent negatives include no abdominal pain.  COPD She complains of chest tightness, cough, shortness of breath, sputum production and wheezing. This is a recurrent problem. The problem occurs daily. The problem has been unchanged. The cough is non-productive. Associated symptoms include headaches. Pertinent negatives include no fever or trouble swallowing. Her symptoms are alleviated by beta-agonist and steroid inhaler. She reports minimal improvement on treatment. Her past medical history is significant for COPD.  Headache  This is a recurrent problem. The problem occurs intermittently. The problem has been unchanged. The pain is located in the Frontal region. The pain does not radiate. The pain quality is similar to prior headaches. The quality of the pain is described as aching. The pain is mild. Associated symptoms include back pain, coughing, hearing loss and weakness. Pertinent negatives include no abdominal pain, dizziness, fever or seizures. She has tried acetaminophen for the symptoms. The treatment provided significant relief.  Pelvic fractures - just out of rehab a week ago.  Still has some pain but gradually improving.  Using a walker.  Living with her daughter, Clarise Cruz.  Taking Tylenol only for pain.  Lab Results  Component Value Date   NA 136 07/01/2020   K 4.0 07/01/2020   CO2 22 07/01/2020   GLUCOSE 90 07/01/2020   BUN 15 07/01/2020   CREATININE 1.04 (H) 07/01/2020   CALCIUM 10.7 (H) 07/01/2020   EGFR 53 (L) 07/01/2020   GFRNONAA 44 (L) 11/05/2019   Lab Results  Component Value Date   CHOL 227 (H) 10/09/2019   HDL 48 10/09/2019   LDLCALC  156 (H) 10/09/2019   TRIG 129 10/09/2019   CHOLHDL 4.7 (H) 10/09/2019   Lab Results  Component Value Date   TSH 2.840 10/16/2019   Lab Results  Component Value Date   HGBA1C 5.5 02/03/2018   Lab Results  Component Value Date   WBC 11.5 (H) 05/08/2020   HGB 13.8 05/08/2020   HCT 40.1 05/08/2020   MCV 82.0 05/08/2020   PLT 263 05/08/2020   Lab Results  Component Value Date   ALT 21 07/01/2020   AST 25 07/01/2020   ALKPHOS 94 07/01/2020   BILITOT 0.3 07/01/2020   Lab Results  Component Value Date   VD25OH 59.8 10/09/2019     Review of Systems  Constitutional:  Negative for chills, fatigue, fever and unexpected weight change.  HENT:  Positive for hearing loss. Negative for trouble swallowing.   Respiratory:  Positive for cough, sputum production, shortness of breath and wheezing.   Gastrointestinal:  Negative for abdominal pain.  Musculoskeletal:  Positive for arthralgias, back pain and gait problem.  Neurological:  Positive for weakness and headaches. Negative for dizziness, seizures and speech difficulty.  Psychiatric/Behavioral:  Negative for dysphoric mood and sleep disturbance. The patient is not nervous/anxious.     Patient Active Problem List   Diagnosis Date Noted   Closed bilateral fracture of pubic rami ([place]) 10/14/2021   Sick sinus syndrome ([place]) 07/16/2020   Alzheimer disease ([place]) 02/22/2020   Closed fracture of left hip ([place]) 02/14/2020   Neuropathy 11/05/2019   Aortic atherosclerosis ([place]) 11/05/2019  Chronic diastolic CHF (congestive heart failure) ([place]) 10/18/2019   Hyponatremia 10/18/2019   Pulmonary nodule 1 cm or greater in diameter 10/18/2019   Thyroid nodule greater than or equal to 1.5 cm in diameter incidentally noted on imaging study 10/18/2019   CKD (chronic kidney disease) stage 3, GFR 30-59 ml/min (HCC) 10/18/2019   Microcytic anemia 10/16/2019   Senile osteoporosis 06/11/2019   Current severe episode of major depressive disorder  without psychotic features ([place]) 06/11/2019   Myalgia due to statin 06/11/2019   Esophageal dysphagia 05/03/2018   Iron deficiency anemia 03/18/2018   Other age-related cataract 05/31/2016   Paroxysmal atrial fibrillation ([place]) 04/15/2016   Other seborrheic keratosis 01/03/2013   Coronary atherosclerosis of native coronary artery 01/03/2013   Vitamin D deficiency 12/19/2012   Mixed hyperlipidemia 12/19/2012   Essential (primary) hypertension 12/19/2012   Chronic obstructive pulmonary disease, unspecified ([place]) 12/19/2012   Age-related osteoporosis with current pathological fracture with routine healing 12/19/2012    Allergies  Allergen Reactions   Penicillins Anaphylaxis, Rash and Other (See Comments)   Azithromycin Other (See Comments)    abd pain    Gemfibrozil Nausea And Vomiting and Rash        Solifenacin     Mild urinary retention   Statins Nausea And Vomiting and Rash    Stomach pain      Past Surgical History:  Procedure Laterality Date   ABDOMINAL HYSTERECTOMY     BREAST SURGERY     COLONOSCOPY WITH PROPOFOL N/A 10/21/2019   Procedure: COLONOSCOPY WITH PROPOFOL;  Surgeon: Jonathon Bellows, MD;  Location[facility] ENDOSCOPY;  Service: Gastroenterology;  Laterality: N/A;   ESOPHAGOGASTRODUODENOSCOPY (EGD) WITH PROPOFOL N/A 10/20/2019   Procedure: ESOPHAGOGASTRODUODENOSCOPY (EGD) WITH PROPOFOL;  Surgeon: Lin Landsman, MD;  Location: [facility] ENDOSCOPY;  Service: Gastroenterology;  Laterality: N/A;   FOREARM SURGERY     GIVENS CAPSULE STUDY N/A 10/21/2019   Procedure: GIVENS CAPSULE STUDY;  Surgeon: Jonathon Bellows, MD;  Location: [facility] ENDOSCOPY;  Service: Gastroenterology;  Laterality: N/A;   TREATMENT INTERTROCHANTERIC, PERITROCHANTERIC, OR SUBTROCHANTERIC FEMUR FX; W/INTRAMED IMPLANT W/WO SCREWS Left 01/2020    Social History   Tobacco Use   Smoking status: Former    Packs/day: 0.50    Years: 45.00    Total pack years: 22.50    Types: Cigarettes    Quit date:  06/10/2004    Years since quitting: 17.4   Smokeless tobacco: Never  Vaping Use   Vaping Use: Never used  Substance Use Topics   Alcohol use: Never   Drug use: Not Currently     Medication list has been reviewed and updated.  Current Meds  Medication Sig   acetaminophen (TYLENOL) 325 MG tablet Take 325 mg by mouth every 4 (four) hours as needed.   albuterol (PROVENTIL) (2.5 MG/3ML) 0.083% nebulizer solution Take 3 mLs (2.5 mg total) by nebulization every 6 (six) hours as needed for wheezing or shortness of breath.   albuterol (VENTOLIN HFA) 108 (90 Base) MCG/ACT inhaler Inhale 2 puffs into the lungs every 6 (six) hours as needed.   amLODipine (NORVASC) 10 MG tablet TAKE 1 TABLET EVERY DAY   aspirin EC 81 MG tablet Take 81 mg by mouth daily. Swallow whole.   budesonide (PULMICORT) 0.5 MG/2ML nebulizer solution Take 2 mLs (0.5 mg total) by nebulization 2 (two) times daily.   CALCIUM PO Take 600 mg by mouth 2 (two) times daily.   Cyanocobalamin (VITAMIN B 12 PO) Take 1,000 mcg by mouth daily.  dextromethorphan-guaiFENesin (MUCINEX DM) 30-600 MG 12hr tablet Take 1 tablet by mouth 2 (two) times daily.   escitalopram (LEXAPRO) 20 MG tablet TAKE 1 TABLET EVERY DAY   ferrous gluconate (FERGON) 324 MG tablet TAKE 1 TABLET EVERY DAY   fluticasone (FLONASE) 50 MCG/ACT nasal spray Place 2 sprays into both nostrils daily.   gabapentin (NEURONTIN) 300 MG capsule Take 300 mg by mouth 3 (three) times daily.   lisinopril (ZESTRIL) 20 MG tablet    lisinopril (ZESTRIL) 40 MG tablet Take 40 mg by mouth daily.   Melatonin 5 MG CHEW Chew by mouth.    metoprolol succinate (TOPROL-XL) 200 MG 24 hr tablet Take 1 tablet (200 mg total) by mouth daily.   Misc Natural Products (FIBER 7 PO) Take by mouth.   montelukast (SINGULAIR) 10 MG tablet TAKE 1 TABLET EVERY DAY AS NEEDED   omeprazole (PRILOSEC) 20 MG capsule TAKE 1 CAPSULE TWICE DAILY BEFORE MEALS   torsemide (DEMADEX) 10 MG tablet Take 10 mg by mouth  daily.   vitamin C (ASCORBIC ACID) 500 MG tablet Take 500 mg by mouth daily.   [DISCONTINUED] cloNIDine (CATAPRES) 0.1 MG tablet Take 0.1 mg by mouth 2 (two) times daily.   [DISCONTINUED] cloNIDine (CATAPRES) 0.2 MG tablet TAKE 2 TABLETS EVERY MORNING AND TAKE 2 TABLETS AT BEDTIME (Patient taking differently: Take 0.1 mg by mouth 2 (two) times daily. TAKE 2 TABLETS EVERY MORNING AND TAKE 2 TABLETS AT BEDTIME)   [DISCONTINUED] fluticasone-salmeterol (ADVAIR) 250-50 MCG/ACT AEPB INHALE 1 PUFF INTO THE LUNGS 2 (TWO) TIMES DAILY.   [DISCONTINUED] gabapentin (NEURONTIN) 100 MG capsule TAKE 1 CAPSULE TWICE DAILY  FOR  NEUROPATHY  PAIN. (Patient taking differently: Take 300 mg by mouth 3 (three) times daily.)   [DISCONTINUED] ipratropium-albuterol (DUONEB) 0.5-2.5 (3) MG/3ML SOLN Take 3 mLs by nebulization.   [DISCONTINUED] lisinopril (ZESTRIL) 30 MG tablet Take 1 tablet (30 mg total) by mouth daily. (Patient taking differently: Take 40 mg by mouth daily.)       11/11/2021    2:04 PM 01/16/2021    2:49 PM 07/01/2020    2:12 PM 02/13/2020    1:44 PM  GAD 7 : Generalized Anxiety Score  Nervous, Anxious, on Edge 2 2 0 1  Control/stop worrying 3 3 0 1  Worry too much - different things 3 3 0 0  Trouble relaxing 3 2 0 0  Restless 2 1 0 0  Easily annoyed or irritable 1 3 0 0  Afraid - awful might happen 1 2 0 0  Total GAD 7 Score 15 16 0 2  Anxiety Difficulty Very difficult Not difficult at all Not difficult at all Not difficult at all       11/11/2021    2:03 PM 01/16/2021    2:48 PM 07/01/2020    2:12 PM  Depression screen PHQ 2/9  Decreased Interest 2 2 1   Down, Depressed, Hopeless 1 1 1   PHQ - 2 Score 3 3 2   Altered sleeping 2 1 0  Tired, decreased energy 2 2 0  Change in appetite 2 2 0  Feeling bad or failure about yourself  1 3 0  Trouble concentrating 2 0 0  Moving slowly or fidgety/restless 2 3 0  Suicidal thoughts 0 0 0  PHQ-9 Score 14 14 2   Difficult doing work/chores Very difficult  Extremely dIfficult Not difficult at all    BP Readings from Last 3 Encounters:  11/11/21 (!) 142/76  01/16/21 120/84  07/01/20 136/82  Physical Exam Vitals and nursing note reviewed.  Constitutional:      General: She is not in acute distress.    Appearance: She is well-developed.     Comments: Elderly, frail, HOH  HENT:     Head: Normocephalic and atraumatic.     Jaw: There is normal jaw occlusion.     Right Ear: Decreased hearing noted.     Left Ear: Decreased hearing noted.     Ears:     Comments: Minimal cerumen in both canals - not obstructing or deep in the canal Cardiovascular:     Rate and Rhythm: Normal rate and regular rhythm.  Pulmonary:     Effort: Pulmonary effort is normal. No accessory muscle usage, prolonged expiration or respiratory distress.     Breath sounds: Decreased air movement present. Decreased breath sounds present. No wheezing or rhonchi.  Musculoskeletal:     Cervical back: Normal range of motion.  Lymphadenopathy:     Cervical: No cervical adenopathy.  Skin:    General: Skin is warm and dry.     Findings: No rash.  Neurological:     Mental Status: She is alert and oriented to person, place, and time.  Psychiatric:        Mood and Affect: Mood normal.        Behavior: Behavior normal.     Wt Readings from Last 3 Encounters:  11/11/21 139 lb (63 kg)  01/16/21 139 lb (63 kg)  07/01/20 124 lb (56.2 kg)    BP (!) 142/76   Pulse 82   Ht 5' (1.524 m)   Wt 139 lb (63 kg)   BMI 27.15 kg/m   Assessment and Plan: 1. Chronic obstructive pulmonary disease, unspecified COPD type ([place]) Stop Advair and begin nebulizer medication bid with albuterol qid PRN - budesonide (PULMICORT) 0.5 MG/2ML nebulizer solution; Take 2 mLs (0.5 mg total) by nebulization 2 (two) times daily.  Dispense: 360 mL; Refill: 1 - albuterol (PROVENTIL) (2.5 MG/3ML) 0.083% nebulizer solution; Take 3 mLs (2.5 mg total) by nebulization every 6 (six) hours as needed for  wheezing or shortness of breath.  Dispense: 540 mL; Refill: 1  2. Essential (primary) hypertension Clinically stable exam with well controlled BP. Tolerating medications without side effects at this time. Pt to continue current regimen and low sodium diet; benefits of regular exercise as able discussed. Continue ASA 81 mg daily - cloNIDine (CATAPRES) 0.1 MG tablet; Take 1 tablet (0.1 mg total) by mouth 2 (two) times daily.  Dispense: 180 tablet; Refill: 1  3. Episodic tension-type headache, not intractable No worrisome features Continue Tylenol as needed  4. Closed bilateral fracture of pubic rami, initial encounter [facility]) Now home from rehab and doing well Pain appears to be controlled Ambulating at home with a walker   Partially dictated using Dragon software. Any errors are unintentional.  Halina Maidens, MD [facility]  11/11/2021

## 2021-11-23 ENCOUNTER — Other Ambulatory Visit: Payer: Self-pay | Admitting: Internal Medicine

## 2021-11-23 DIAGNOSIS — G629 Polyneuropathy, unspecified: Secondary | ICD-10-CM

## 2021-11-23 DIAGNOSIS — I10 Essential (primary) hypertension: Secondary | ICD-10-CM

## 2021-11-24 NOTE — Telephone Encounter (Signed)
Requested Prescriptions  Pending Prescriptions Disp Refills  . metoprolol (TOPROL-XL) 200 MG 24 hr tablet [Pharmacy Med Name: METOPROLOL SUCCINATE ER 200 MG Tablet Extended Release 24 Hour] 90 tablet 1    Sig: TAKE 1 TABLET EVERY DAY     Cardiovascular:  Beta Blockers Failed - 11/23/2021  2:36 PM      Failed - Last BP in normal range    BP Readings from Last 1 Encounters:  11/11/21 (!) 142/76         Passed - Last Heart Rate in normal range    Pulse Readings from Last 1 Encounters:  11/11/21 82         Passed - Valid encounter within last 6 months    Recent Outpatient Visits          1 week ago Chronic obstructive pulmonary disease, unspecified COPD type [facility])   [facility] Glean Hess, MD   10 months ago RUQ abdominal pain   [facility] Glean Hess, MD   1 year ago Essential (primary) hypertension   [facility] Glean Hess, MD   1 year ago Essential (primary) hypertension   [facility] Glean Hess, MD   1 year ago Essential (primary) hypertension   [facility] Glean Hess, MD             . gabapentin (NEURONTIN) 100 MG capsule [Pharmacy Med Name: GABAPENTIN 100 MG Capsule] 180 capsule 1    Sig: TAKE 1 CAPSULE TWICE DAILY  FOR  NEUROPATHY  PAIN.     Neurology: Anticonvulsants - gabapentin Failed - 11/23/2021  2:36 PM      Failed - Cr in normal range and within 360 days    Creatinine, Ser  Date Value Ref Range Status  07/01/2020 1.04 (H) 0.57 - 1.00 mg/dL Final         Passed - Completed PHQ-2 or PHQ-9 in the last 360 days      Passed - Valid encounter within last 12 months    Recent Outpatient Visits          1 week ago Chronic obstructive pulmonary disease, unspecified COPD type [facility])   [facility] Glean Hess, MD   10 months ago RUQ abdominal pain   [facility] Glean Hess, MD   1 year ago Essential (primary) hypertension   [facility]  Glean Hess, MD   1 year ago Essential (primary) hypertension   [facility] Glean Hess, MD   1 year ago Essential (primary) hypertension   [facility] Glean Hess, MD

## 2021-12-08 ENCOUNTER — Ambulatory Visit (INDEPENDENT_AMBULATORY_CARE_PROVIDER_SITE_OTHER): Payer: Medicare HMO

## 2021-12-08 DIAGNOSIS — Z0000 Encounter for general adult medical examination without abnormal findings: Secondary | ICD-10-CM

## 2021-12-08 NOTE — Progress Notes (Signed)
I connected with  Jessica Howell on 12/08/21 by a audio enabled telemedicine application and verified that I am speaking with the correct person using two identifiers. I conducted the visit with the patient Jessica Howell and her daughter Jessica Howell who is her [place].    Patient Location: Home  Provider Location: Office/Clinic  I discussed the limitations of evaluation and management by telemedicine. The patient expressed understanding and agreed to proceed.   Subjective:   Jessica Howell is a 87 y.o. female who presents for Medicare Annual (Subsequent) preventive examination.  Review of Systems    Per HPI unless specifically indicated below Cardiac Risk Factors include: advanced age (>75mn, >>55women);female gender, hypertension         Objective:       11/11/2021    1:57 PM 01/16/2021    2:35 PM 07/01/2020    2:10 PM  Vitals with BMI  Height '5\' 0"'$  '5\' 0"'$  '5\' 0"'$   Weight 139 lbs 139 lbs 124 lbs  BMI 27.15 [phone] Systolic [id number] Diastolic 76 84 82  Pulse 82 93 60    There were no vitals filed for this visit. There is no height or weight on file to calculate BMI.     05/08/2020    2:33 PM 11/06/2019   11:39 AM 10/18/2019    6:16 AM 10/17/2019    6:27 PM 10/16/2019    4:17 PM 10/16/2019    1:26 PM 09/26/2019    3:40 PM  Advanced Directives  Does Patient Have a Medical Advance Directive? Yes Yes No No No Yes Yes  Type of Advance Directive   Living will;Healthcare Power of [place]  Does patient want to make changes to medical advance directive? No - Patient declined  No - Patient declined  No - Guardian declined No - Patient declined   Copy of [place] Chart?   No - copy requested  No - copy requested No - copy requested Yes - validated most recent copy scanned in chart (See row information)  Would patient like information on creating a medical  advance directive?   No - Patient declined        Current Medications (verified) Outpatient Encounter Medications as of 12/08/2021  Medication Sig   acetaminophen (TYLENOL) 325 MG tablet Take 325 mg by mouth every 4 (four) hours as needed.   albuterol (PROVENTIL) (2.5 MG/3ML) 0.083% nebulizer solution Take 3 mLs (2.5 mg total) by nebulization every 6 (six) hours as needed for wheezing or shortness of breath.   albuterol (VENTOLIN HFA) 108 (90 Base) MCG/ACT inhaler Inhale 2 puffs into the lungs every 6 (six) hours as needed.   amLODipine (NORVASC) 10 MG tablet TAKE 1 TABLET EVERY DAY   aspirin EC 81 MG tablet Take 81 mg by mouth daily. Swallow whole.   budesonide (PULMICORT) 0.5 MG/2ML nebulizer solution Take 2 mLs (0.5 mg total) by nebulization 2 (two) times daily.   CALCIUM PO Take 600 mg by mouth 2 (two) times daily.   cloNIDine (CATAPRES) 0.1 MG tablet Take 1 tablet (0.1 mg total) by mouth 2 (two) times daily.   Cyanocobalamin (VITAMIN B 12 PO) Take 1,000 mcg by mouth daily.   dicyclomine (BENTYL) 10 MG capsule Take 10 mg by mouth in the morning, at noon, in the evening, and at bedtime.   escitalopram (LEXAPRO) 20 MG tablet TAKE 1 TABLET  EVERY DAY   ferrous gluconate (FERGON) 324 MG tablet TAKE 1 TABLET EVERY DAY   fluticasone (FLONASE) 50 MCG/ACT nasal spray Place 2 sprays into both nostrils daily.   gabapentin (NEURONTIN) 300 MG capsule Take 100 mg by mouth 2 (two) times daily.   lisinopril (ZESTRIL) 40 MG tablet Take 40 mg by mouth daily.   Melatonin 5 MG CHEW Chew by mouth.    metoprolol (TOPROL-XL) 200 MG 24 hr tablet TAKE 1 TABLET EVERY DAY   Misc Natural Products (FIBER 7 PO) Take by mouth.   montelukast (SINGULAIR) 10 MG tablet TAKE 1 TABLET EVERY DAY AS NEEDED   omeprazole (PRILOSEC) 20 MG capsule TAKE 1 CAPSULE TWICE DAILY BEFORE MEALS   torsemide (DEMADEX) 10 MG tablet Take 10 mg by mouth daily.   vitamin C (ASCORBIC ACID) 500 MG tablet Take 500 mg by mouth daily.    dextromethorphan-guaiFENesin (MUCINEX DM) 30-600 MG 12hr tablet Take 1 tablet by mouth 2 (two) times daily. (Patient not taking: Reported on 12/08/2021)   [DISCONTINUED] lisinopril (ZESTRIL) 20 MG tablet  (Patient not taking: Reported on 12/08/2021)   No facility-administered encounter medications on file as of 12/08/2021.    Allergies (verified) Penicillins, Azithromycin, Gemfibrozil, Solifenacin, and Statins   History: Past Medical History:  Diagnosis Date   A-fib (HCC)    Allergy    Anemia    CHF (congestive heart failure) (HCC)    Chronic anticoagulation, recent, Xarelto  10/18/2019   COPD (chronic obstructive pulmonary disease) (HCC)    Depression    Feeling grief    GERD (gastroesophageal reflux disease)    Hyperlipidemia    Hypertension    Hypertensive urgency 10/18/2019   Hypokalemia 09/06/2016   Last Assessment & Plan:  Formatting of this note is different from the original. Will continue to monitor; ordered labs as below. Lab Results  Component Value Date   K 3.2 (L) 04/12/2016   K 3.5 04/10/2016   K 4.2 04/09/2016   K 4.4 11/21/2013   K 4.4 01/03/2013   Myocardial infarction [facility])    Osteoarthritis    Post-nasal drip 11/10/2015   Formatting of this note might be different from the original. Overview:   (Working Diagnosis)  Last Assessment & Plan:  Formatting of this note might be different from the original. Continue OTC flonase 2 sprays daily Likely contributing to cough.  Will see if flonase helps with cough and PND   Right wrist pain 06/20/2015   Last Assessment & Plan:  Formatting of this note might be different from the original. Significant edema, and some grip and extension weakness, and persistent severe pain. She was not able to tolerate splint Reviewed xr of her recent ED visit No fractures identified (except old fracture on radial head)  +++ snuff box tenderness persists Referred to [facility] Information provide   Upper GI bleed 10/19/2019   Vitamin D  deficiency    Past Surgical History:  Procedure Laterality Date   ABDOMINAL HYSTERECTOMY     BREAST SURGERY     COLONOSCOPY WITH PROPOFOL N/A 10/21/2019   Procedure: COLONOSCOPY WITH PROPOFOL;  Surgeon: Jonathon Bellows, MD;  Location: [facility] ENDOSCOPY;  Service: Gastroenterology;  Laterality: N/A;   ESOPHAGOGASTRODUODENOSCOPY (EGD) WITH PROPOFOL N/A 10/20/2019   Procedure: ESOPHAGOGASTRODUODENOSCOPY (EGD) WITH PROPOFOL;  Surgeon: Lin Landsman, MD;  Location: [facility] ENDOSCOPY;  Service: Gastroenterology;  Laterality: N/A;   FOREARM SURGERY     GIVENS CAPSULE STUDY N/A 10/21/2019   Procedure: GIVENS CAPSULE  STUDY;  Surgeon: Jonathon Bellows, MD;  Location: [facility] ENDOSCOPY;  Service: Gastroenterology;  Laterality: N/A;   TREATMENT INTERTROCHANTERIC, PERITROCHANTERIC, OR SUBTROCHANTERIC FEMUR FX; W/INTRAMED IMPLANT W/WO SCREWS Left 01/2020   Family History  Problem Relation Age of Onset   Diabetes Mother    Hypertension Mother    Cancer Father        esoph.    COPD Sister    Heart disease Brother    Early death Son    Social History   Socioeconomic History   Marital status: Widowed    Spouse name: Not on file   Number of children: 1   Years of education: Not on file   Highest education level: Not on file  Occupational History   Occupation: retired  Tobacco Use   Smoking status: Former    Packs/day: 0.50    Years: 45.00    Total pack years: 22.50    Types: Cigarettes    Quit date: 06/10/2004    Years since quitting: 17.5   Smokeless tobacco: Never  Vaping Use   Vaping Use: Never used  Substance and Sexual Activity   Alcohol use: Never   Drug use: Not Currently   Sexual activity: Not Currently  Other Topics Concern   Not on file  Social History Narrative   Living alone. Husband passed away in Nov 11, 2018. Daughter checking in on her and great grandson checks in also.    Social Determinants of Health   Financial Resource Strain: Low Risk  (12/08/2021)   Overall Financial Resource  Strain (CARDIA)    Difficulty of Paying Living Expenses: Not hard at all  Food Insecurity: No Food Insecurity (12/08/2021)   Hunger Vital Sign    Worried About Running Out of Food in the Last Year: Never true    Ran Out of Food in the Last Year: Never true  Transportation Needs: No Transportation Needs (12/08/2021)   PRAPARE - Hydrologist (Medical): No    Lack of Transportation (Non-Medical): No  Physical Activity: Inactive (09/26/2019)   Exercise Vital Sign    Days of Exercise per Week: 0 days    Minutes of Exercise per Session: 0 min  Stress: Stress Concern Present (09/26/2019)   [place]    Feeling of Stress : Very much  Social Connections: Socially Isolated (12/08/2021)   Social Connection and Isolation Panel [NHANES]    Frequency of Communication with Friends and Family: Never    Frequency of Social Gatherings with Friends and Family: Never    Attends Religious Services: Never    Marine scientist or Organizations: No    Attends Archivist Meetings: Never    Marital Status: Widowed    Tobacco Counseling Counseling given: Not Answered Does not apply, because the patient is not a smoker.  Clinical Intake:  Pre-visit preparation completed: No  Pain : No/denies pain     Nutritional Status: BMI of 19-24  Normal Nutritional Risks: None Diabetes: No     Diabetic?No     Information entered by :: Donnie Mesa, CMA   Activities of Daily Living    12/08/2021    2:51 PM 01/16/2021    2:49 PM  In your present state of health, do you have any difficulty performing the following activities:  Hearing? 1 1  Vision? 1 1  Difficulty concentrating or making decisions? 1 1  Walking or climbing stairs? 0 1  Dressing or  bathing? 1 0  Doing errands, shopping? 1 1    Patient Care Team: Glean Hess, MD as PCP - General (Internal Medicine) Dr. Pasty Arch  (Psychiatry) Corey Skains, MD as Consulting Physician (Cardiology) Sindy Guadeloupe, MD as Consulting Physician (Oncology) Virgel Manifold, MD (Inactive) as Consulting Physician (Gastroenterology) Cyndi Bender, MD (Psychiatry)  Indicate any recent Medical Services you may have received from other than [facility] providers in the past year (date may be approximate).    Hospitalization on 10/16/2021 for unspecified fracture of the pubis from a fall.  Assessment:   This is a routine wellness examination for [place].  Hearing/Vision screen Pt c/o difficulty hearing, plans to get hearing evaluation to assess for hearing aids Vision Screening Comments: Past due for eye exam. Recommended yearly ophthalmology/optometry visit for glaucoma screening and checkup  Dietary issues and exercise activities discussed: Current Exercise Habits: Structured exercise class, Type of exercise: Other - see comments (stationary bike), Time (Minutes): 30, Frequency (Times/Week): >7, Weekly Exercise (Minutes/Week): 0, Intensity: Moderate, Exercise limited by: orthopedic condition(s)   Goals Addressed             This Visit's Progress    Prevent falls       Increase strength and mobility.       Depression Screen    12/08/2021    3:01 PM 11/11/2021    2:03 PM 01/16/2021    2:48 PM 07/01/2020    2:12 PM 04/02/2020    2:30 PM 02/13/2020    1:43 PM 11/05/2019    3:03 PM  PHQ 2/9 Scores  PHQ - 2 Score 0 '3 3 2 '$ 0 4 3  PHQ- 9 Score  '14 14 2 7 5 7    '$ Fall Risk    12/08/2021    2:49 PM 01/16/2021    2:49 PM 07/01/2020    2:12 PM 04/02/2020    2:29 PM 02/13/2020    1:43 PM  Fall Risk   Falls in the past year? 1 0 0 1 0  Number falls in past yr: 1 0 0 1 0  Injury with Fall? 1 0 0 1 0  Risk for fall due to : Impaired balance/gait No Fall Risks   No Fall Risks  Follow up Falls evaluation completed Falls evaluation completed Falls evaluation completed Falls evaluation completed Follow up appointment     [place]:  Any stairs in or around the home? No  If so, are there any without handrails? No  Home free of loose throw rugs in walkways, pet beds, electrical cords, etc? Yes  Adequate lighting in your home to reduce risk of falls? Yes   ASSISTIVE DEVICES UTILIZED TO PREVENT FALLS:  Life alert? No  Use of a cane, walker or w/c? Yes  Grab bars in the bathroom? Yes  Shower chair or bench in shower? Yes  Elevated toilet seat or a handicapped toilet? Yes   TIMED UP AND GO: Unable to perform, virtual telephone visit Cognitive Function:        Immunizations Immunization History  Administered Date(s) Administered   Fluad Quad(high Dose 65+) 02/13/2020, 01/16/2021   Influenza-Unspecified 02/16/2017, 01/10/2019, 02/13/2020   Moderna Sars-Covid-2 Vaccination 06/06/2019, 07/04/2019, 05/07/2020   Pneumococcal Conjugate-13 09/07/2013   Pneumococcal Polysaccharide-23 03/04/1999, 12/14/2010   Tdap 12/14/2010    TDAP status: Due, Education has been provided regarding the importance of this vaccine. Advised may receive this vaccine at local pharmacy or Health Dept. Aware to  provide a copy of the vaccination record if obtained from local pharmacy or Health Dept. Verbalized acceptance and understanding.  Flu Vaccine status: Up to date  Pneumococcal vaccine status: Up to date  Covid-19 vaccine status: Completed vaccines  Qualifies for Shingles Vaccine? Yes   Zostavax completed No   Shingrix Completed?: Yes. The pt received one of the Shingrix vaccine at [facility]. Will schedule an appt to get the second vaccine.   Screening Tests Health Maintenance  Topic Date Due   Zoster Vaccines- Shingrix (1 of 2) Never done   DEXA SCAN  Never done   COVID-19 Vaccine (4 - Moderna risk series) 07/02/2020   TETANUS/TDAP  12/13/2020   INFLUENZA VACCINE  11/24/2021   Pneumonia Vaccine 47+ Years old  Completed   HPV VACCINES  Aged Out    Health  Maintenance  Health Maintenance Due  Topic Date Due   Zoster Vaccines- Shingrix (1 of 2) Never done   DEXA SCAN  Never done   COVID-19 Vaccine (4 - Moderna risk series) 07/02/2020   TETANUS/TDAP  12/13/2020   INFLUENZA VACCINE  11/24/2021    Colorectal cancer screening: No longer required.   Mammogram status: No longer required due to age.  DEXA Scan: No longer Required   Lung Cancer Screening: (Low Dose CT Chest recommended if Age 64-80 years, 30 pack-year currently smoking OR have quit w/in 15years.) does not qualify.   Lung Cancer Screening Referral: does not qualify   Additional Screening:  Hepatitis C Screening: does qualify  Vision Screening: Recommended annual ophthalmology exams for early detection of glaucoma and other disorders of the eye. Is the patient up to date with their annual eye exam?  No  Who is the provider or what is the name of the office in which the patient attends annual eye exams?  If pt is not established with a provider, would they like to be referred to a provider to establish care? No .  Recommended yearly ophthalmology/optometry visit for glaucoma screening and checkup Dental Screening: Recommended annual dental exams for proper oral hygiene  Community Resource Referral / Chronic Care Management: CRR required this visit?  No   CCM required this visit?  No      Plan:     I have personally reviewed and noted the following in the patient's chart:   Medical and social history Use of alcohol, tobacco or illicit drugs  Current medications and supplements including opioid prescriptions.  Functional ability and status Nutritional status Physical activity Advanced directives List of other physicians Hospitalizations, surgeries, and ER visits in previous 12 months Vitals Screenings to include cognitive, depression, and falls Referrals and appointments  In addition, I have reviewed and discussed with patient certain preventive protocols,  quality metrics, and best practice recommendations. A written personalized care plan for preventive services as well as general preventive health recommendations were provided to patient.     Jessica Howell , Thank you for taking time to come for your Medicare Wellness Visit. I appreciate your ongoing commitment to your health goals. Please review the following plan we discussed and let me know if I can assist you in the future.   These are the goals we discussed:  Goals      Prevent falls     Increase strength and mobility.        This is a list of the screening recommended for you and due dates:  Health Maintenance  Topic Date Due   Zoster (Shingles) Vaccine (1  of 2) Never done   DEXA scan (bone density measurement)  Never done   COVID-19 Vaccine (4 - Moderna risk series) 07/02/2020   Tetanus Vaccine  12/13/2020   Flu Shot  11/24/2021   Pneumonia Vaccine  Completed   HPV Vaccine  Aged [address] [place], [place]   12/08/2021   Nurse Notes: Approximately 40 minute Non-face-to-face visit

## 2021-12-08 NOTE — Patient Instructions (Signed)

## 2021-12-14 ENCOUNTER — Other Ambulatory Visit: Payer: Self-pay | Admitting: Internal Medicine

## 2021-12-14 DIAGNOSIS — R1319 Other dysphagia: Secondary | ICD-10-CM

## 2021-12-14 DIAGNOSIS — F322 Major depressive disorder, single episode, severe without psychotic features: Secondary | ICD-10-CM

## 2021-12-14 DIAGNOSIS — I10 Essential (primary) hypertension: Secondary | ICD-10-CM

## 2021-12-14 MED ORDER — AMLODIPINE BESYLATE 10 MG PO TABS: 10.0000 mg | ORAL_TABLET | Freq: Every day | ORAL | 0 refills | Status: DC

## 2021-12-14 MED ORDER — GABAPENTIN 100 MG PO CAPS: 100.0000 mg | ORAL_CAPSULE | Freq: Two times a day (BID) | ORAL | 0 refills | Status: DC

## 2021-12-14 MED ORDER — ESCITALOPRAM OXALATE 20 MG PO TABS: 20.0000 mg | ORAL_TABLET | Freq: Every day | ORAL | 0 refills | Status: DC

## 2021-12-14 MED ORDER — LISINOPRIL 40 MG PO TABS
40.0000 mg | ORAL_TABLET | Freq: Every day | ORAL | 0 refills | Status: DC
Start: 2021-12-14 — End: 2022-02-26

## 2021-12-14 MED ORDER — MONTELUKAST SODIUM 10 MG PO TABS: 10.0000 mg | ORAL_TABLET | Freq: Every day | ORAL | 0 refills | Status: DC

## 2021-12-15 NOTE — Telephone Encounter (Signed)
Requested Prescriptions  Pending Prescriptions Disp Refills  . omeprazole (PRILOSEC) 20 MG capsule [Pharmacy Med Name: OMEPRAZOLE 20 MG Capsule Delayed Release] 180 capsule 3    Sig: TAKE 1 CAPSULE TWICE DAILY BEFORE MEALS     Gastroenterology: Proton Pump Inhibitors Passed - 12/14/2021  2:48 PM      Passed - Valid encounter within last 12 months    Recent Outpatient Visits          1 month ago Chronic obstructive pulmonary disease, unspecified COPD type ([place])   [place] [facility] at [facility], Jesse Sans, MD   11 months ago RUQ abdominal pain   [place] [facility] at [facility], Jesse Sans, MD   1 year ago Essential (primary) hypertension   [place] [facility] at [facility], Jesse Sans, MD   1 year ago Essential (primary) hypertension   [place] [facility] at [facility], Jesse Sans, MD   1 year ago Essential (primary) hypertension   [place] [facility] at [facility], Jesse Sans, MD

## 2021-12-21 ENCOUNTER — Other Ambulatory Visit (HOSPITAL_COMMUNITY): Payer: Self-pay

## 2021-12-21 ENCOUNTER — Encounter: Payer: Self-pay | Admitting: Oncology

## 2021-12-21 ENCOUNTER — Other Ambulatory Visit: Payer: Self-pay | Admitting: Internal Medicine

## 2021-12-21 MED ORDER — TORSEMIDE 10 MG PO TABS
10.0000 mg | ORAL_TABLET | Freq: Every day | ORAL | 0 refills | Status: DC
Start: 2021-12-21 — End: 2022-03-23
  Filled 2021-12-21 – 2021-12-30 (×2): qty 90, 90d supply, fill #0

## 2021-12-21 NOTE — Telephone Encounter (Signed)
Please review. Not on medication list.  KP

## 2021-12-30 ENCOUNTER — Other Ambulatory Visit (HOSPITAL_COMMUNITY): Payer: Self-pay

## 2021-12-31 ENCOUNTER — Other Ambulatory Visit (HOSPITAL_COMMUNITY): Payer: Self-pay

## 2022-01-24 ENCOUNTER — Other Ambulatory Visit: Payer: Self-pay | Admitting: Internal Medicine

## 2022-01-24 DIAGNOSIS — J449 Chronic obstructive pulmonary disease, unspecified: Secondary | ICD-10-CM

## 2022-01-25 NOTE — Telephone Encounter (Signed)
Requested Prescriptions  Pending Prescriptions Disp Refills  . albuterol (PROVENTIL) (2.5 MG/3ML) 0.083% nebulizer solution [Pharmacy Med Name: ALBUTEROL SULFATE (2.5 MG/3ML) 0.083% Nebulization Solution] 540 mL 1    Sig: INHALE THE CONTENTS OF 1 VIAL VIA NEBULIZER EVERY 6 HOURS AS NEEDED FOR SHORTNESS OF BREATH OR FOR WHEEZING     Pulmonology:  Beta Agonists 2 Failed - 01/24/2022  5:55 AM      Failed - Last BP in normal range    BP Readings from Last 1 Encounters:  11/11/21 (!) 142/76         Passed - Last Heart Rate in normal range    Pulse Readings from Last 1 Encounters:  11/11/21 82         Passed - Valid encounter within last 12 months    Recent Outpatient Visits          2 months ago Chronic obstructive pulmonary disease, unspecified COPD type ([place])   [place] [facility] at [facility], Jesse Sans, MD   1 year ago RUQ abdominal pain   [place] [facility] at [facility], Jesse Sans, MD   1 year ago Essential (primary) hypertension   [place] [facility] at [facility], Jesse Sans, MD   1 year ago Essential (primary) hypertension    Primary Care and Sports Medicine at [facility], Jesse Sans, MD   1 year ago Essential (primary) hypertension    Primary Care and Sports Medicine at [facility], Jesse Sans, MD

## 2022-01-31 ENCOUNTER — Other Ambulatory Visit: Payer: Self-pay | Admitting: Internal Medicine

## 2022-01-31 MED ORDER — DICYCLOMINE HCL 10 MG PO CAPS: 10.0000 mg | ORAL_CAPSULE | Freq: Four times a day (QID) | ORAL | 1 refills | Status: DC

## 2022-02-26 ENCOUNTER — Other Ambulatory Visit: Payer: Self-pay | Admitting: Internal Medicine

## 2022-02-26 DIAGNOSIS — I10 Essential (primary) hypertension: Secondary | ICD-10-CM

## 2022-02-26 DIAGNOSIS — R1319 Other dysphagia: Secondary | ICD-10-CM

## 2022-02-26 DIAGNOSIS — F322 Major depressive disorder, single episode, severe without psychotic features: Secondary | ICD-10-CM

## 2022-02-26 NOTE — Telephone Encounter (Signed)
Requested Prescriptions  Pending Prescriptions Disp Refills   escitalopram (LEXAPRO) 20 MG tablet [Pharmacy Med Name: ESCITALOPRAM OXALATE 20 MG Tablet] 90 tablet 0    Sig: TAKE 1 TABLET EVERY DAY     Psychiatry:  Antidepressants - SSRI Passed - 02/26/2022  2:35 AM      Passed - Completed PHQ-2 or PHQ-9 in the last 360 days      Passed - Valid encounter within last 6 months    Recent Outpatient Visits           3 months ago Chronic obstructive pulmonary disease, unspecified COPD type ([place])   [place] [facility] at [facility], Jesse Sans, MD   1 year ago RUQ abdominal pain   [place] [facility] at [facility], Jesse Sans, MD   1 year ago Essential (primary) hypertension   [place] [facility] at [facility], Jesse Sans, MD   1 year ago Essential (primary) hypertension   [place] [facility] and [facility] Medicine at [facility], Jesse Sans, MD   2 years ago Essential (primary) hypertension   [place] [facility] Medicine at [facility], Jesse Sans, MD               lisinopril (ZESTRIL) 40 MG tablet [Pharmacy Med Name: LISINOPRIL 40 MG Tablet] 90 tablet 0    Sig: TAKE 1 TABLET EVERY DAY     Cardiovascular:  ACE Inhibitors Failed - 02/26/2022  2:35 AM      Failed - Cr in normal range and within 180 days    Creatinine, Ser  Date Value Ref Range Status  07/01/2020 1.04 (H) 0.57 - 1.00 mg/dL Final         Failed - K in normal range and within 180 days    Potassium  Date Value Ref Range Status  07/01/2020 4.0 3.5 - 5.2 mmol/L Final         Failed - Last BP in normal range    BP Readings from Last 1 Encounters:  11/11/21 (!) 142/76         Passed - Patient is not pregnant      Passed - Valid encounter within last 6 months    Recent Outpatient Visits           3 months ago Chronic obstructive pulmonary disease, unspecified  COPD type ([place])   [place] [facility] at [facility], Jesse Sans, MD   1 year ago RUQ abdominal pain   [place] [facility] at [facility], Jesse Sans, MD   1 year ago Essential (primary) hypertension   [place] [facility] at [facility], Jesse Sans, MD   1 year ago Essential (primary) hypertension   [place] [facility] at [facility], Jesse Sans, MD   2 years ago Essential (primary) hypertension   [place] [facility] at [facility], Jesse Sans, MD               amLODipine (NORVASC) 10 MG tablet [Pharmacy Med Name: AMLODIPINE BESYLATE 10 MG Tablet] 90 tablet 0    Sig: TAKE 1 TABLET EVERY DAY     Cardiovascular: Calcium Channel Blockers 2 Failed - 02/26/2022  2:35 AM      Failed - Last BP in normal  range    BP Readings from Last 1 Encounters:  11/11/21 (!) 142/76         Passed - Last Heart Rate in normal range    Pulse Readings from Last 1 Encounters:  11/11/21 82         Passed - Valid encounter within last 6 months    Recent Outpatient Visits           3 months ago Chronic obstructive pulmonary disease, unspecified COPD type ([place])   [place] [facility] at [facility], Jesse Sans, MD   1 year ago RUQ abdominal pain   [place] [facility] at [facility], Jesse Sans, MD   1 year ago Essential (primary) hypertension   [place] [facility] at [facility], Jesse Sans, MD   1 year ago Essential (primary) hypertension   [place] [facility] and [facility] Medicine at [facility], Jesse Sans, MD   2 years ago Essential (primary) hypertension   [place]  [facility] Care and Sports Medicine at [facility], Jesse Sans, MD               montelukast (SINGULAIR) 10 MG tablet  [Pharmacy Med Name: MONTELUKAST SODIUM 10 MG Tablet] 90 tablet 0    Sig: TAKE 1 TABLET AT BEDTIME     Pulmonology:  Leukotriene Inhibitors Passed - 02/26/2022  2:35 AM      Passed - Valid encounter within last 12 months    Recent Outpatient Visits           3 months ago Chronic obstructive pulmonary disease, unspecified COPD type ([place])   [place] [facility] at [facility], Jesse Sans, MD   1 year ago RUQ abdominal pain   [place] [facility] at [facility], Jesse Sans, MD   1 year ago Essential (primary) hypertension   [place] [facility] at [facility], Jesse Sans, MD   1 year ago Essential (primary) hypertension   [place] [facility] at [facility], Jesse Sans, MD   2 years ago Essential (primary) hypertension   [place] [facility] at [facility], Jesse Sans, MD

## 2022-03-23 ENCOUNTER — Other Ambulatory Visit: Payer: Self-pay | Admitting: Internal Medicine

## 2022-03-23 NOTE — Telephone Encounter (Signed)
Requested medications are due for refill today.  yes  Requested medications are on the active medications list.  yes  Last refill. 8/28/203 #[phone] 0 rf  Future visit scheduled.   no  Notes to clinic.  Labs are expired    Requested Prescriptions  Pending Prescriptions Disp Refills   torsemide (DEMADEX) 10 MG tablet 90 tablet 0    Sig: Take 1 tablet by mouth daily.     Cardiovascular:  Diuretics - Loop Failed - 03/23/2022 10:59 AM      Failed - K in normal range and within 180 days    Potassium  Date Value Ref Range Status  07/01/2020 4.0 3.5 - 5.2 mmol/L Final         Failed - Ca in normal range and within 180 days    Calcium  Date Value Ref Range Status  07/01/2020 10.7 (H) 8.7 - 10.3 mg/dL Final         Failed - Na in normal range and within 180 days    Sodium  Date Value Ref Range Status  07/01/2020 136 134 - 144 mmol/L Final         Failed - Cr in normal range and within 180 days    Creatinine, Ser  Date Value Ref Range Status  07/01/2020 1.04 (H) 0.57 - 1.00 mg/dL Final         Failed - Cl in normal range and within 180 days    Chloride  Date Value Ref Range Status  07/01/2020 96 96 - 106 mmol/L Final         Failed - Mg Level in normal range and within 180 days    Magnesium  Date Value Ref Range Status  10/23/2019 2.3 1.7 - 2.4 mg/dL Final    Comment:    Performed at [facility], [address] [place], [place] [address]         Failed - Last BP in normal range    BP Readings from Last 1 Encounters:  11/11/21 (!) 142/76         Passed - Valid encounter within last 6 months    Recent Outpatient Visits           4 months ago Chronic obstructive pulmonary disease, unspecified COPD type ([place])   [place] [facility] at [facility], Jesse Sans, MD   1 year ago RUQ abdominal pain   [place] [facility] at [facility], Jesse Sans, MD   1 year ago Essential (primary)  hypertension   [place] [facility] at [facility], Jesse Sans, MD   1 year ago Essential (primary) hypertension   [place] [facility] at [facility], Jesse Sans, MD   2 years ago Essential (primary) hypertension   [place] [facility] at [facility], Jesse Sans, MD

## 2022-03-24 ENCOUNTER — Other Ambulatory Visit (HOSPITAL_COMMUNITY): Payer: Self-pay

## 2022-03-24 MED ORDER — TORSEMIDE 10 MG PO TABS
10.0000 mg | ORAL_TABLET | Freq: Every day | ORAL | 0 refills | Status: DC
  Filled 2022-03-24 – 2022-04-05 (×2): qty 90, 90d supply, fill #0

## 2022-03-25 ENCOUNTER — Other Ambulatory Visit (HOSPITAL_COMMUNITY): Payer: Self-pay

## 2022-03-27 ENCOUNTER — Other Ambulatory Visit (HOSPITAL_COMMUNITY): Payer: Self-pay

## 2022-03-29 ENCOUNTER — Other Ambulatory Visit (HOSPITAL_COMMUNITY): Payer: Self-pay

## 2022-03-30 ENCOUNTER — Other Ambulatory Visit (HOSPITAL_COMMUNITY): Payer: Self-pay

## 2022-03-31 ENCOUNTER — Other Ambulatory Visit (HOSPITAL_COMMUNITY): Payer: Self-pay

## 2022-04-05 ENCOUNTER — Other Ambulatory Visit (HOSPITAL_COMMUNITY): Payer: Self-pay

## 2022-04-05 ENCOUNTER — Other Ambulatory Visit: Payer: Self-pay | Admitting: Internal Medicine

## 2022-04-05 ENCOUNTER — Ambulatory Visit: Payer: Self-pay

## 2022-04-05 NOTE — Telephone Encounter (Signed)
  Chief Complaint: SOB Symptoms: cough, congestion with wheezing and rattling in chest, SOB Frequency: 2 days  Pertinent Negatives: Patient denies fever Disposition: '[]'$ ED /'[]'$ Urgent Care (no appt availability in office) / '[x]'$ Appointment(In office/virtual)/ '[]'$  [place] [facility]/ '[]'$ Home Care/ '[]'$ Refused Recommended Disposition /'[]'$ [place] [facility]/ '[]'$  Follow-up with PCP Additional Notes: daughter has been giving Coricidin HBP and neb tx and not getting better. Offered appt today at 1500 with Dr. Zigmund Daniel but daughter declined and wants to see PCP. Scheduled appt for tomorrow at 1620.   Reason for Disposition  [1] MILD difficulty breathing (e.g., minimal/no SOB at rest, SOB with walking, pulse <100) AND [2] NEW-onset or WORSE than normal  Answer Assessment - Initial Assessment Questions 1. RESPIRATORY STATUS: "Describe your breathing?" (e.g., wheezing, shortness of breath, unable to speak, severe coughing)      Cough, wheezing, SOB  2. ONSET: "When did this breathing problem begin?"      2 days  4. SEVERITY: "How bad is your breathing?" (e.g., mild, moderate, severe)    - MILD: No SOB at rest, mild SOB with walking, speaks normally in sentences, can lie down, no retractions, pulse < 100.    - MODERATE: SOB at rest, SOB with minimal exertion and prefers to sit, cannot lie down flat, speaks in phrases, mild retractions, audible wheezing, pulse 100-120.    - SEVERE: Very SOB at rest, speaks in single words, struggling to breathe, sitting hunched forward, retractions, pulse > 120      moderate 6. CARDIAC HISTORY: "Do you have any history of heart disease?" (e.g., heart attack, angina, bypass surgery, angioplasty)      HTN 7. LUNG HISTORY: "Do you have any history of lung disease?"  (e.g., pulmonary embolus, asthma, emphysema)     COPD 9. OTHER SYMPTOMS: "Do you have any other symptoms? (e.g., dizziness, runny nose, cough, chest pain, fever)     Cough and congestion, rattling in the  chest  Protocols used: Breathing Difficulty-A-AH

## 2022-04-06 ENCOUNTER — Encounter: Payer: Self-pay | Admitting: Internal Medicine

## 2022-04-06 ENCOUNTER — Ambulatory Visit (INDEPENDENT_AMBULATORY_CARE_PROVIDER_SITE_OTHER): Payer: Medicare HMO | Admitting: Internal Medicine

## 2022-04-06 VITALS — BP 129/72 | HR 69 | Temp 98.1°F | Ht 60.0 in | Wt 144.0 lb

## 2022-04-06 DIAGNOSIS — I5032 Chronic diastolic (congestive) heart failure: Secondary | ICD-10-CM | POA: Diagnosis not present

## 2022-04-06 DIAGNOSIS — J441 Chronic obstructive pulmonary disease with (acute) exacerbation: Secondary | ICD-10-CM | POA: Diagnosis not present

## 2022-04-06 DIAGNOSIS — F322 Major depressive disorder, single episode, severe without psychotic features: Secondary | ICD-10-CM | POA: Diagnosis not present

## 2022-04-06 DIAGNOSIS — I480 Paroxysmal atrial fibrillation: Secondary | ICD-10-CM | POA: Diagnosis not present

## 2022-04-06 DIAGNOSIS — I700 Atherosclerosis of aorta: Secondary | ICD-10-CM | POA: Diagnosis not present

## 2022-04-06 MED ORDER — PREDNISONE 10 MG PO TABS: ORAL_TABLET | ORAL | 0 refills | Status: AC

## 2022-04-06 MED ORDER — DOXYCYCLINE HYCLATE 100 MG PO TABS: 100.0000 mg | ORAL_TABLET | Freq: Two times a day (BID) | ORAL | 0 refills | Status: AC

## 2022-04-06 MED ORDER — TORSEMIDE 10 MG PO TABS: 10.0000 mg | ORAL_TABLET | Freq: Every day | ORAL | 0 refills | Status: DC

## 2022-04-06 NOTE — Progress Notes (Signed)
Date:  04/06/2022   Name:  Jessica Howell   DOB:  1935/03/06   MRN:  [id number]   Chief Complaint: Cough  Cough This is a new problem. The current episode started in the past 7 days. The problem has been gradually worsening. The problem occurs every few minutes. The cough is Productive of sputum. Associated symptoms include myalgias, shortness of breath and wheezing. Pertinent negatives include no chest pain, chills, fever or headaches. The symptoms are aggravated by exercise and lying down. She has tried a beta-agonist inhaler for the symptoms. The treatment provided mild relief. Her past medical history is significant for COPD.    Lab Results  Component Value Date   NA 136 07/01/2020   K 4.0 07/01/2020   CO2 22 07/01/2020   GLUCOSE 90 07/01/2020   BUN 15 07/01/2020   CREATININE 1.04 (H) 07/01/2020   CALCIUM 10.7 (H) 07/01/2020   EGFR 53 (L) 07/01/2020   GFRNONAA 44 (L) 11/05/2019   Lab Results  Component Value Date   CHOL 227 (H) 10/09/2019   HDL 48 10/09/2019   LDLCALC 156 (H) 10/09/2019   TRIG 129 10/09/2019   CHOLHDL 4.7 (H) 10/09/2019   Lab Results  Component Value Date   TSH 2.840 10/16/2019   Lab Results  Component Value Date   HGBA1C 5.5 02/03/2018   Lab Results  Component Value Date   WBC 11.5 (H) 05/08/2020   HGB 13.8 05/08/2020   HCT 40.1 05/08/2020   MCV 82.0 05/08/2020   PLT 263 05/08/2020   Lab Results  Component Value Date   ALT 21 07/01/2020   AST 25 07/01/2020   ALKPHOS 94 07/01/2020   BILITOT 0.3 07/01/2020   Lab Results  Component Value Date   VD25OH 59.8 10/09/2019     Review of Systems  Constitutional:  Negative for appetite change, chills, fatigue and fever.  Respiratory:  Positive for cough, chest tightness, shortness of breath and wheezing.   Cardiovascular:  Negative for chest pain, palpitations and leg swelling.  Gastrointestinal:  Negative for nausea and vomiting.  Musculoskeletal:  Positive for arthralgias, gait problem  and myalgias.  Neurological:  Negative for dizziness and headaches.  Psychiatric/Behavioral:  Positive for sleep disturbance. Negative for dysphoric mood. The patient is not nervous/anxious.     Patient Active Problem List   Diagnosis Date Noted   Closed bilateral fracture of pubic rami ([place]) 10/14/2021   Sick sinus syndrome ([place]) 07/16/2020   Alzheimer disease ([place]) 02/22/2020   Closed fracture of left hip ([place]) 02/14/2020   Neuropathy 11/05/2019   Aortic atherosclerosis ([place]) 11/05/2019   Chronic diastolic CHF (congestive heart failure) ([place]) 10/18/2019   Hyponatremia 10/18/2019   Pulmonary nodule 1 cm or greater in diameter 10/18/2019   Thyroid nodule greater than or equal to 1.5 cm in diameter incidentally noted on imaging study 10/18/2019   CKD (chronic kidney disease) stage 3, GFR 30-59 ml/min (HCC) 10/18/2019   Microcytic anemia 10/16/2019   Senile osteoporosis 06/11/2019   Current severe episode of major depressive disorder without psychotic features ([place]) 06/11/2019   Myalgia due to statin 06/11/2019   Esophageal dysphagia 05/03/2018   Iron deficiency anemia 03/18/2018   Other age-related cataract 05/31/2016   Paroxysmal atrial fibrillation ([place]) 04/15/2016   Other seborrheic keratosis 01/03/2013   Coronary atherosclerosis of native coronary artery 01/03/2013   Vitamin D deficiency 12/19/2012   Mixed hyperlipidemia 12/19/2012   Essential (primary) hypertension 12/19/2012   Chronic obstructive pulmonary disease, unspecified ([place]) 12/19/2012  Age-related osteoporosis with current pathological fracture with routine healing 12/19/2012    Allergies  Allergen Reactions   Penicillins Anaphylaxis, Rash and Other (See Comments)   Azithromycin Other (See Comments)    abd pain    Gemfibrozil Nausea And Vomiting and Rash        Solifenacin     Mild urinary retention   Statins Nausea And Vomiting and Rash    Stomach pain      Past Surgical History:  Procedure  Laterality Date   ABDOMINAL HYSTERECTOMY     BREAST SURGERY     COLONOSCOPY WITH PROPOFOL N/A 10/21/2019   Procedure: COLONOSCOPY WITH PROPOFOL;  Surgeon: Jonathon Bellows, MD;  Location: [facility] ENDOSCOPY;  Service: Gastroenterology;  Laterality: N/A;   ESOPHAGOGASTRODUODENOSCOPY (EGD) WITH PROPOFOL N/A 10/20/2019   Procedure: ESOPHAGOGASTRODUODENOSCOPY (EGD) WITH PROPOFOL;  Surgeon: Lin Landsman, MD;  Location[facility] ENDOSCOPY;  Service: Gastroenterology;  Laterality: N/A;   FOREARM SURGERY     GIVENS CAPSULE STUDY N/A 10/21/2019   Procedure: GIVENS CAPSULE STUDY;  Surgeon: Jonathon Bellows, MD;  Location: [facility] ENDOSCOPY;  Service: Gastroenterology;  Laterality: N/A;   TREATMENT INTERTROCHANTERIC, PERITROCHANTERIC, OR SUBTROCHANTERIC FEMUR FX; W/INTRAMED IMPLANT W/WO SCREWS Left 01/2020    Social History   Tobacco Use   Smoking status: Former    Packs/day: 0.50    Years: 45.00    Total pack years: 22.50    Types: Cigarettes    Quit date: 06/10/2004    Years since quitting: 17.8   Smokeless tobacco: Never  Vaping Use   Vaping Use: Never used  Substance Use Topics   Alcohol use: Never   Drug use: Not Currently     Medication list has been reviewed and updated.  Current Meds  Medication Sig   acetaminophen (TYLENOL) 325 MG tablet Take 325 mg by mouth every 4 (four) hours as needed.   albuterol (PROVENTIL) (2.5 MG/3ML) 0.083% nebulizer solution INHALE THE CONTENTS OF 1 VIAL VIA NEBULIZER EVERY 6 HOURS AS NEEDED FOR SHORTNESS OF BREATH OR FOR WHEEZING   albuterol (VENTOLIN HFA) 108 (90 Base) MCG/ACT inhaler Inhale 2 puffs into the lungs every 6 (six) hours as needed.   amLODipine (NORVASC) 10 MG tablet TAKE 1 TABLET EVERY DAY   aspirin EC 81 MG tablet Take 81 mg by mouth daily. Swallow whole.   budesonide (PULMICORT) 0.5 MG/2ML nebulizer solution Take 2 mLs (0.5 mg total) by nebulization 2 (two) times daily.   CALCIUM PO Take 600 mg by mouth 2 (two) times daily.   cloNIDine (CATAPRES)  0.1 MG tablet Take 1 tablet (0.1 mg total) by mouth 2 (two) times daily.   Cyanocobalamin (VITAMIN B 12 PO) Take 1,000 mcg by mouth daily.   dicyclomine (BENTYL) 10 MG capsule Take 1 capsule (10 mg total) by mouth in the morning, at noon, in the evening, and at bedtime.   escitalopram (LEXAPRO) 20 MG tablet TAKE 1 TABLET EVERY DAY   ferrous gluconate (FERGON) 324 MG tablet TAKE 1 TABLET EVERY DAY   fluticasone (FLONASE) 50 MCG/ACT nasal spray Place 2 sprays into both nostrils daily.   gabapentin (NEURONTIN) 100 MG capsule TAKE 1 CAPSULE TWICE DAILY   lisinopril (ZESTRIL) 40 MG tablet TAKE 1 TABLET EVERY DAY   Melatonin 5 MG CHEW Chew by mouth.    metoprolol (TOPROL-XL) 200 MG 24 hr tablet TAKE 1 TABLET EVERY DAY   Misc Natural Products (FIBER 7 PO) Take by mouth.   montelukast (SINGULAIR) 10 MG tablet TAKE 1 TABLET AT  BEDTIME   omeprazole (PRILOSEC) 20 MG capsule TAKE 1 CAPSULE TWICE DAILY BEFORE MEALS   torsemide (DEMADEX) 10 MG tablet Take 1 tablet (10 mg total) by mouth daily.   vitamin C (ASCORBIC ACID) 500 MG tablet Take 500 mg by mouth daily.       11/11/2021    2:04 PM 01/16/2021    2:49 PM 07/01/2020    2:12 PM 02/13/2020    1:44 PM  GAD 7 : Generalized Anxiety Score  Nervous, Anxious, on Edge 2 2 0 1  Control/stop worrying 3 3 0 1  Worry too much - different things 3 3 0 0  Trouble relaxing 3 2 0 0  Restless 2 1 0 0  Easily annoyed or irritable 1 3 0 0  Afraid - awful might happen 1 2 0 0  Total GAD 7 Score 15 16 0 2  Anxiety Difficulty Very difficult Not difficult at all Not difficult at all Not difficult at all       12/08/2021    3:01 PM 11/11/2021    2:03 PM 01/16/2021    2:48 PM  Depression screen PHQ 2/9  Decreased Interest 0 2 2  Down, Depressed, Hopeless 0 1 1  PHQ - 2 Score 0 3 3  Altered sleeping  2 1  Tired, decreased energy  2 2  Change in appetite  2 2  Feeling bad or failure about yourself   1 3  Trouble concentrating  2 0  Moving slowly or  fidgety/restless  2 3  Suicidal thoughts  0 0  PHQ-9 Score  14 14  Difficult doing work/chores  Very difficult Extremely dIfficult    BP Readings from Last 3 Encounters:  04/06/22 129/72  11/11/21 (!) 142/76  01/16/21 120/84    Physical Exam Vitals and nursing note reviewed.  Constitutional:      General: She is not in acute distress.    Appearance: She is well-developed. She is not ill-appearing.  HENT:     Head: Normocephalic and atraumatic.  Cardiovascular:     Rate and Rhythm: Normal rate and regular rhythm.  Pulmonary:     Effort: Pulmonary effort is normal. No prolonged expiration or respiratory distress.     Breath sounds: Decreased breath sounds present. No wheezing or rhonchi.  Musculoskeletal:     Cervical back: Normal range of motion.  Lymphadenopathy:     Cervical: No cervical adenopathy.  Skin:    General: Skin is warm and dry.     Findings: No rash.  Neurological:     Mental Status: She is alert and oriented to person, place, and time.     Gait: Gait abnormal (uses cane for ambulation).  Psychiatric:        Mood and Affect: Mood normal.        Behavior: Behavior normal.     Wt Readings from Last 3 Encounters:  04/06/22 144 lb (65.3 kg)  11/11/21 139 lb (63 kg)  01/16/21 139 lb (63 kg)    BP 129/72   Pulse 69   Temp 98.1 F (36.7 C) (Oral)   Ht 5' (1.524 m)   Wt 144 lb (65.3 kg)   SpO2 92%   BMI 28.12 kg/m   Assessment and Plan: 1. Acute exacerbation of chronic obstructive pulmonary disease (COPD) (HCC) Continue albuterol nebs at least twice a day Doxycycline and steroid taper. Follow up if no improvement. - predniSONE (DELTASONE) 10 MG tablet; Take 6 tablets (60 mg total) by mouth  daily with breakfast for 1 day, THEN 5 tablets (50 mg total) daily with breakfast for 1 day, THEN 4 tablets (40 mg total) daily with breakfast for 1 day, THEN 3 tablets (30 mg total) daily with breakfast for 1 day, THEN 2 tablets (20 mg total) daily with breakfast  for 1 day, THEN 1 tablet (10 mg total) daily with breakfast for 1 day.  Dispense: 21 tablet; Refill: 0 - doxycycline (VIBRA-TABS) 100 MG tablet; Take 1 tablet (100 mg total) by mouth 2 (two) times daily for 10 days.  Dispense: 20 tablet; Refill: 0  2. Chronic diastolic CHF (congestive heart failure) (HCC) Stable on beta blocker and diuretic Recommend continued annual cardiology follow up. - torsemide (DEMADEX) 10 MG tablet; Take 1 tablet (10 mg total) by mouth daily.  Dispense: 90 tablet; Refill: 0  3. Current severe episode of major depressive disorder without psychotic features without prior episode ([place]) Clinically stable on current regimen with good control of symptoms, No SI or HI. Will continue current therapy.  4. Aortic atherosclerosis (HCC) Intolerant of statins Will not push these due to age  15. Paroxysmal atrial fibrillation (HCC) In SR today. Continue beta blockers and aspirin   Partially dictated using Editor, commissioning. Any errors are unintentional.  Halina Maidens, MD [facility]  04/06/2022

## 2022-04-09 ENCOUNTER — Other Ambulatory Visit: Payer: Self-pay | Admitting: Internal Medicine

## 2022-04-09 DIAGNOSIS — I10 Essential (primary) hypertension: Secondary | ICD-10-CM

## 2022-04-09 NOTE — Telephone Encounter (Signed)
Requested Prescriptions  Pending Prescriptions Disp Refills   cloNIDine (CATAPRES) 0.1 MG tablet [Pharmacy Med Name: CLONIDINE HYDROCHLORIDE 0.1 MG Tablet] 180 tablet 0    Sig: TAKE 1 TABLET TWICE DAILY     Cardiovascular:  Alpha-2 Agonists Passed - 04/09/2022  2:18 AM      Passed - Last BP in normal range    BP Readings from Last 1 Encounters:  04/06/22 129/72         Passed - Last Heart Rate in normal range    Pulse Readings from Last 1 Encounters:  04/06/22 69         Passed - Valid encounter within last 6 months    Recent Outpatient Visits           3 days ago Acute exacerbation of chronic obstructive pulmonary disease (COPD) ([place])   [place] [facility] at [facility], Jesse Sans, MD   4 months ago Chronic obstructive pulmonary disease, unspecified COPD type [facility])   [place] [facility] at [facility], Jesse Sans, MD   1 year ago RUQ abdominal pain   [place] [facility] at [facility], Jesse Sans, MD   1 year ago Essential (primary) hypertension   [place] [facility] at [facility], Jesse Sans, MD   2 years ago Essential (primary) hypertension   [place] [facility] at [facility], Jesse Sans, MD

## 2022-04-28 ENCOUNTER — Other Ambulatory Visit: Payer: Self-pay | Admitting: Internal Medicine

## 2022-04-28 DIAGNOSIS — I10 Essential (primary) hypertension: Secondary | ICD-10-CM

## 2022-05-03 DIAGNOSIS — I5033 Acute on chronic diastolic (congestive) heart failure: Secondary | ICD-10-CM | POA: Diagnosis not present

## 2022-05-03 DIAGNOSIS — I272 Pulmonary hypertension, unspecified: Secondary | ICD-10-CM | POA: Diagnosis not present

## 2022-05-03 DIAGNOSIS — I509 Heart failure, unspecified: Secondary | ICD-10-CM | POA: Diagnosis not present

## 2022-05-03 DIAGNOSIS — J449 Chronic obstructive pulmonary disease, unspecified: Secondary | ICD-10-CM | POA: Diagnosis not present

## 2022-05-03 DIAGNOSIS — I480 Paroxysmal atrial fibrillation: Secondary | ICD-10-CM | POA: Diagnosis not present

## 2022-05-03 DIAGNOSIS — I5030 Unspecified diastolic (congestive) heart failure: Secondary | ICD-10-CM | POA: Diagnosis not present

## 2022-05-03 DIAGNOSIS — Z87891 Personal history of nicotine dependence: Secondary | ICD-10-CM | POA: Diagnosis not present

## 2022-05-03 DIAGNOSIS — R5381 Other malaise: Secondary | ICD-10-CM | POA: Diagnosis not present

## 2022-05-03 DIAGNOSIS — J9601 Acute respiratory failure with hypoxia: Secondary | ICD-10-CM | POA: Diagnosis not present

## 2022-05-03 DIAGNOSIS — R918 Other nonspecific abnormal finding of lung field: Secondary | ICD-10-CM | POA: Diagnosis not present

## 2022-05-03 DIAGNOSIS — R5383 Other fatigue: Secondary | ICD-10-CM | POA: Diagnosis not present

## 2022-05-03 DIAGNOSIS — I081 Rheumatic disorders of both mitral and tricuspid valves: Secondary | ICD-10-CM | POA: Diagnosis not present

## 2022-05-03 DIAGNOSIS — J441 Chronic obstructive pulmonary disease with (acute) exacerbation: Secondary | ICD-10-CM | POA: Diagnosis not present

## 2022-05-03 DIAGNOSIS — J811 Chronic pulmonary edema: Secondary | ICD-10-CM | POA: Diagnosis not present

## 2022-05-03 DIAGNOSIS — J810 Acute pulmonary edema: Secondary | ICD-10-CM | POA: Diagnosis not present

## 2022-05-03 DIAGNOSIS — F05 Delirium due to known physiological condition: Secondary | ICD-10-CM | POA: Diagnosis not present

## 2022-05-03 DIAGNOSIS — N183 Chronic kidney disease, stage 3 unspecified: Secondary | ICD-10-CM | POA: Diagnosis not present

## 2022-05-03 DIAGNOSIS — R4182 Altered mental status, unspecified: Secondary | ICD-10-CM | POA: Diagnosis not present

## 2022-05-03 DIAGNOSIS — R55 Syncope and collapse: Secondary | ICD-10-CM | POA: Diagnosis not present

## 2022-05-03 DIAGNOSIS — I1A0 Resistant hypertension: Secondary | ICD-10-CM | POA: Diagnosis not present

## 2022-05-03 DIAGNOSIS — R279 Unspecified lack of coordination: Secondary | ICD-10-CM | POA: Diagnosis not present

## 2022-05-03 DIAGNOSIS — R630 Anorexia: Secondary | ICD-10-CM | POA: Diagnosis not present

## 2022-05-03 DIAGNOSIS — I130 Hypertensive heart and chronic kidney disease with heart failure and stage 1 through stage 4 chronic kidney disease, or unspecified chronic kidney disease: Secondary | ICD-10-CM | POA: Diagnosis not present

## 2022-05-03 DIAGNOSIS — I252 Old myocardial infarction: Secondary | ICD-10-CM | POA: Diagnosis not present

## 2022-05-03 DIAGNOSIS — I10 Essential (primary) hypertension: Secondary | ICD-10-CM | POA: Diagnosis not present

## 2022-05-03 DIAGNOSIS — J9690 Respiratory failure, unspecified, unspecified whether with hypoxia or hypercapnia: Secondary | ICD-10-CM | POA: Diagnosis not present

## 2022-05-03 DIAGNOSIS — I5032 Chronic diastolic (congestive) heart failure: Secondary | ICD-10-CM | POA: Diagnosis not present

## 2022-05-03 DIAGNOSIS — G629 Polyneuropathy, unspecified: Secondary | ICD-10-CM | POA: Diagnosis not present

## 2022-05-03 DIAGNOSIS — R419 Unspecified symptoms and signs involving cognitive functions and awareness: Secondary | ICD-10-CM | POA: Diagnosis not present

## 2022-05-03 DIAGNOSIS — I493 Ventricular premature depolarization: Secondary | ICD-10-CM | POA: Diagnosis not present

## 2022-05-03 DIAGNOSIS — M6281 Muscle weakness (generalized): Secondary | ICD-10-CM | POA: Diagnosis not present

## 2022-05-03 DIAGNOSIS — I2101 ST elevation (STEMI) myocardial infarction involving left main coronary artery: Secondary | ICD-10-CM | POA: Diagnosis not present

## 2022-05-03 DIAGNOSIS — R410 Disorientation, unspecified: Secondary | ICD-10-CM | POA: Diagnosis not present

## 2022-05-03 DIAGNOSIS — Z9981 Dependence on supplemental oxygen: Secondary | ICD-10-CM | POA: Diagnosis not present

## 2022-05-03 DIAGNOSIS — I2510 Atherosclerotic heart disease of native coronary artery without angina pectoris: Secondary | ICD-10-CM | POA: Diagnosis not present

## 2022-05-05 DIAGNOSIS — I5030 Unspecified diastolic (congestive) heart failure: Secondary | ICD-10-CM | POA: Diagnosis not present

## 2022-05-05 DIAGNOSIS — I1A0 Resistant hypertension: Secondary | ICD-10-CM | POA: Diagnosis not present

## 2022-05-05 DIAGNOSIS — I2101 ST elevation (STEMI) myocardial infarction involving left main coronary artery: Secondary | ICD-10-CM | POA: Diagnosis not present

## 2022-05-05 DIAGNOSIS — J9601 Acute respiratory failure with hypoxia: Secondary | ICD-10-CM | POA: Diagnosis not present

## 2022-05-05 DIAGNOSIS — J441 Chronic obstructive pulmonary disease with (acute) exacerbation: Secondary | ICD-10-CM | POA: Diagnosis not present

## 2022-05-06 DIAGNOSIS — R410 Disorientation, unspecified: Secondary | ICD-10-CM | POA: Diagnosis not present

## 2022-05-09 IMAGING — CT CT ANGIO CHEST-ABD-PELV FOR DISSECTION W/ AND WO/W CM
2 of 7 series · 12 of 46 positions shown, 14 images · IV contrast (APPLIED)
Comparison: None.

CLINICAL DATA: 84-year-old female with chest pain. Concern for
acute aortic syndrome.

EXAM:
CT ANGIOGRAPHY CHEST, ABDOMEN AND PELVIS
TECHNIQUE: Non-contrast CT of the chest was initially obtained.

[Series 6: coronals · coronal · 0.69mm/px · 3 of 131 slices shown]
[im 33/131  soft-tissue]
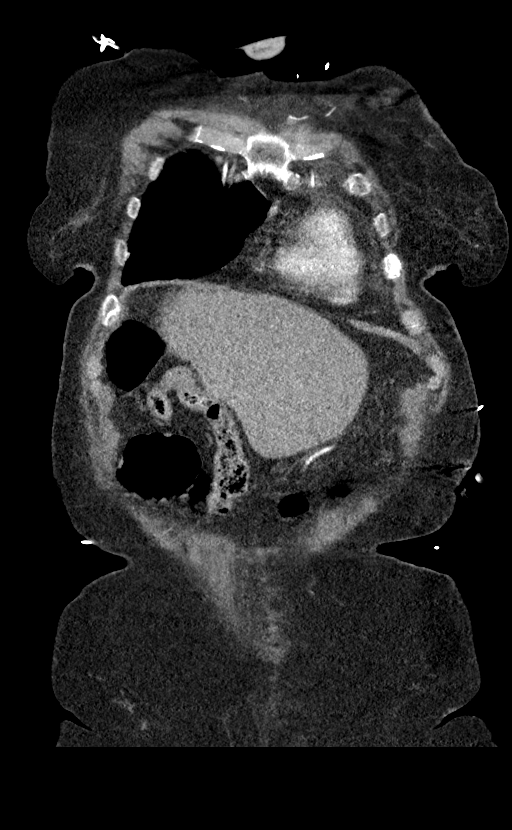
[im 66/131  soft-tissue]
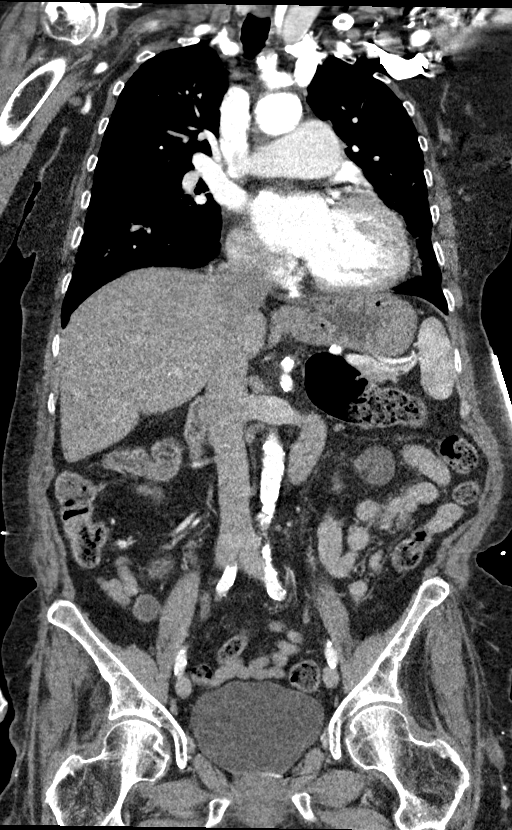
[im 98/131  soft-tissue]
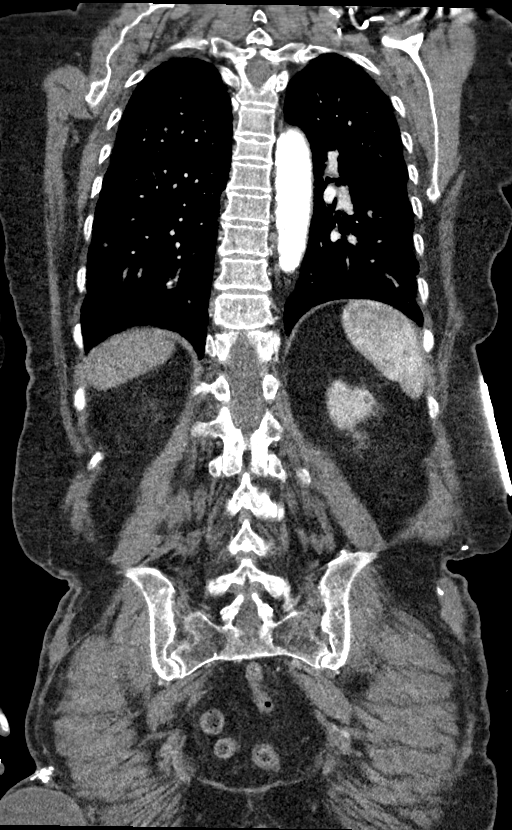

[Series 10: axial arterial · axial · arterial · 0.65mm/px · z∈[-664,-175]mm · 9 of 189 slices shown, 11 images]
[im 13/189  soft-tissue]
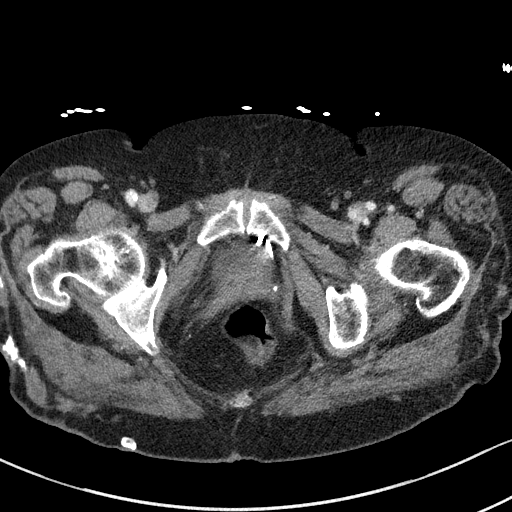
[im 13/189  bone]
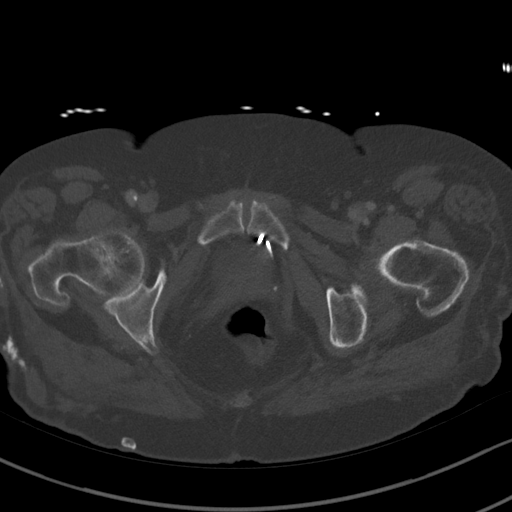
[im 38/189  soft-tissue]
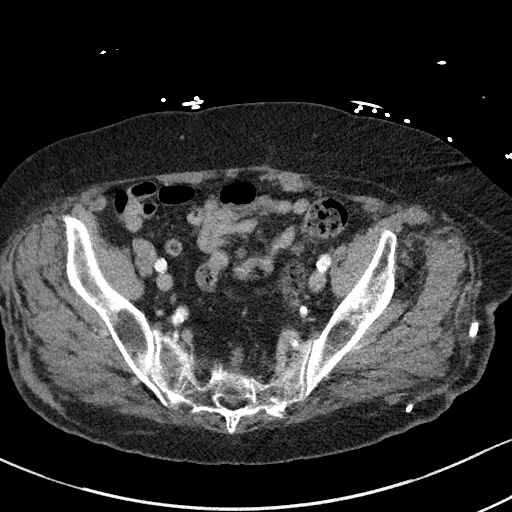
[im 51/189  soft-tissue]
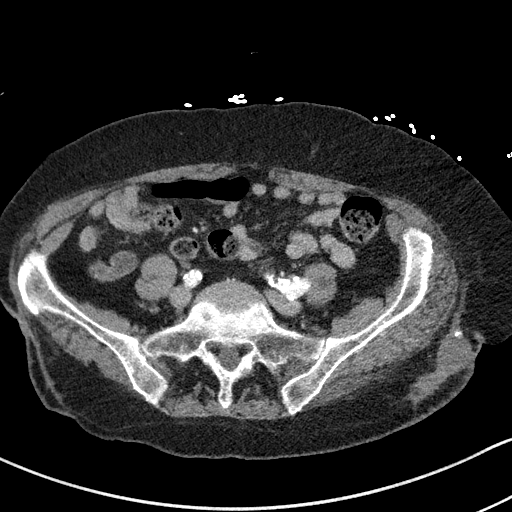
[im 76/189  soft-tissue]
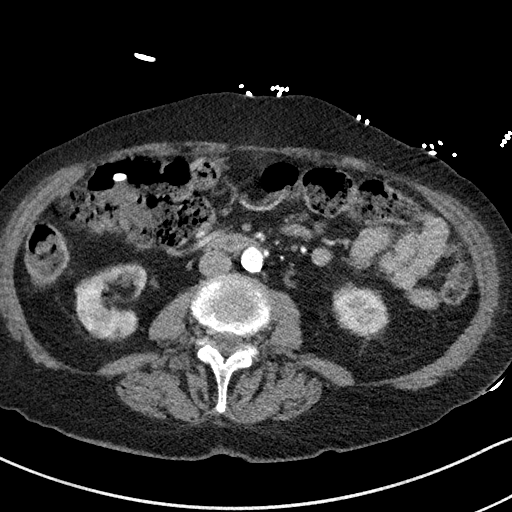
[im 101/189  soft-tissue]
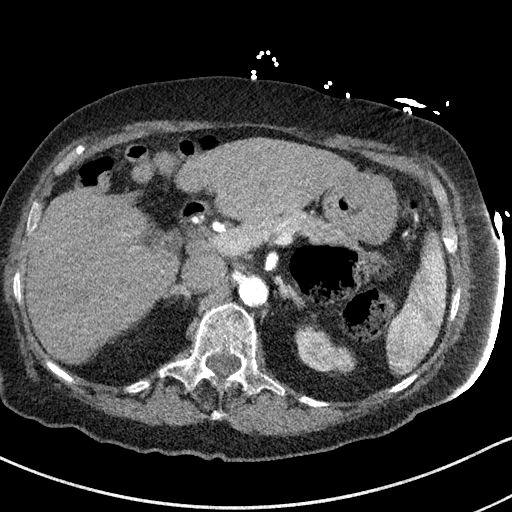
[im 113/189  soft-tissue]
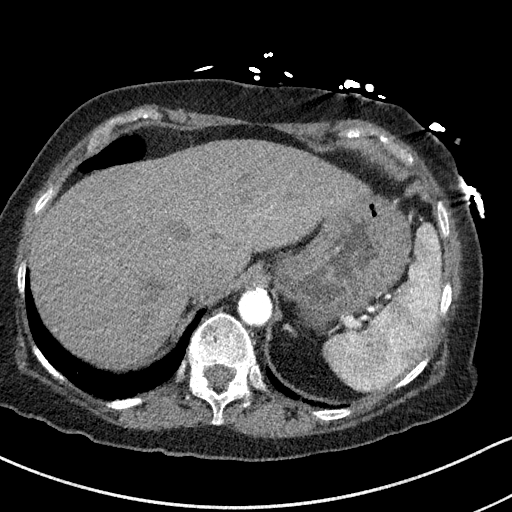
[im 138/189  soft-tissue]
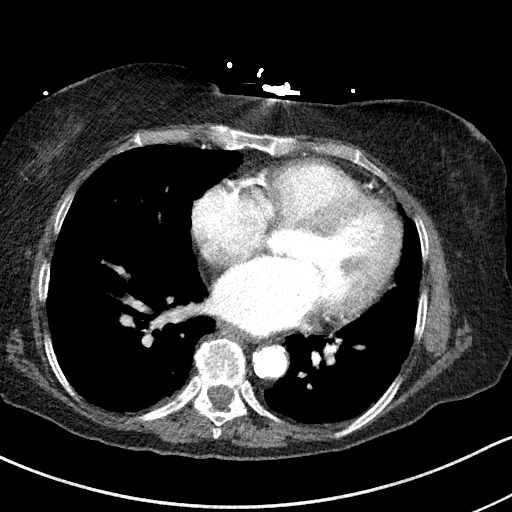
[im 151/189  soft-tissue]
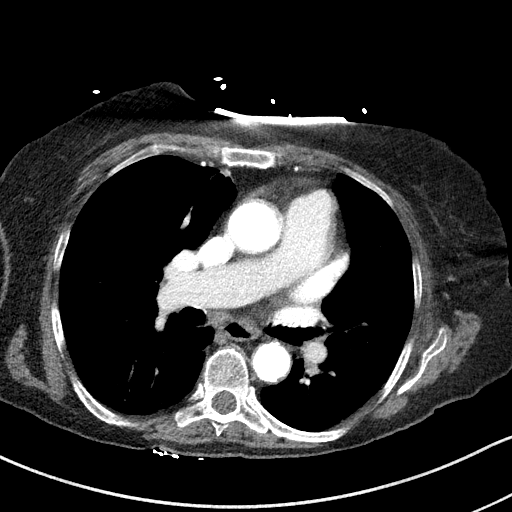
[im 176/189  soft-tissue]
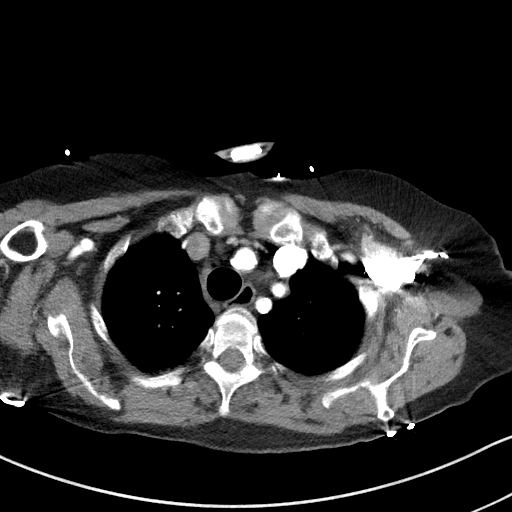
[im 176/189  bone]
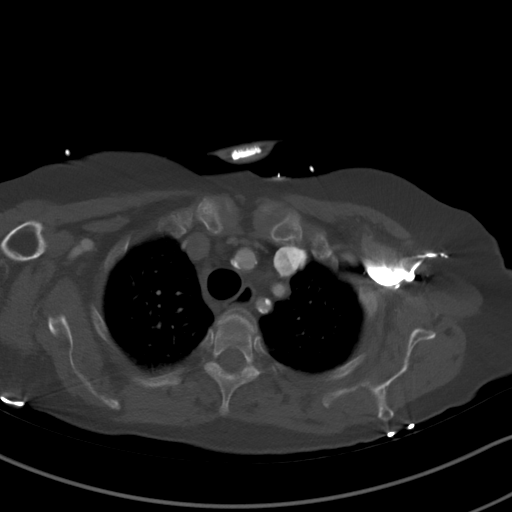

[12 of 46 positions shown; findings below may reference images not displayed]

Multidetector CT imaging through the chest, abdomen and pelvis was
performed using the standard protocol during bolus administration of
intravenous contrast. Multiplanar reconstructed images and MIPs were
obtained and reviewed to evaluate the vascular anatomy.

CONTRAST:  75mL OMNIPAQUE IOHEXOL 350 MG/ML SOLN
FINDINGS: CTA CHEST FINDINGS

Cardiovascular: There is mild cardiomegaly. No pericardial effusion.
Advanced 3 vessel coronary vascular calcification. There is advanced
atherosclerotic calcification of the thoracic aorta. No aneurysmal
dilatation or dissection. The origins of the great vessels of the
aortic arch appear patent as visualized. There is mild dilatation of
main pulmonary trunk suggestive of pulmonary hypertension. Clinical
correlation is recommended. Evaluation of the pulmonary arteries is
limited due to suboptimal opacification and timing of the contrast.
The central pulmonary arteries appear patent as visualized.

Mediastinum/Nodes: Mildly enlarged right hilar lymph node measures
16 cm in short axis. No mediastinal adenopathy. The esophagus is
grossly unremarkable. There is a 2 cm heterogeneous right thyroid
nodule. Recommend thyroid US (ref: [HOSPITAL]. [DATE]):
143-50).No mediastinal fluid collection.

Lungs/Pleura: There is a 1 cm nodule along the superior aspect of
the right major fissure. No focal consolidation, pleural effusion,
pneumothorax. The central airways are patent.

Musculoskeletal: Mild degenerative changes of the spine as well as
arthritic changes of the shoulders. No acute osseous pathology.

Review of the MIP images confirms the above findings.

CTA ABDOMEN AND PELVIS FINDINGS

VASCULAR

Aorta: Advanced atherosclerotic calcification. No dissection or
aneurysm.

Celiac: There is atherosclerotic calcification of the origin of the
celiac axis. The celiac artery and its main branches are patent.

SMA: Atherosclerotic calcification of the proximal SMA. The SMA is
patent.

Renals: Atherosclerotic calcification of the renal arteries. The
renal arteries remain patent. And accessory left renal artery noted.

IMA: The IMA is patent.

Inflow: Advanced atherosclerotic calcification of the iliac
arteries. The iliac arteries remain patent.

Veins: No obvious venous abnormality within the limitations of this
arterial phase study.

Review of the MIP images confirms the above findings.

NON-VASCULAR

No intra-abdominal free air or free fluid.

Hepatobiliary: No focal liver abnormality is seen. No gallstones,
gallbladder wall thickening, or biliary dilatation.

Pancreas: Unremarkable. No pancreatic ductal dilatation or
surrounding inflammatory changes.

Spleen: Normal in size without focal abnormality.

Adrenals/Urinary Tract: The adrenal glands unremarkable. There is no
hydronephrosis on either side. There is thank more atrophy and
cortical scarring of the inferior pole of the right kidney. Several
left renal cysts measure up to 2.5 cm. Subcentimeter right renal
hypodense lesion is too small to characterize. This can be better
evaluated with ultrasound on a nonemergent/outpatient basis. The
visualized ureters and urinary bladder appear unremarkable.

Stomach/Bowel: There is no bowel obstruction or active inflammation.
The appendix is normal.

Lymphatic: No adenopathy.

Reproductive: Hysterectomy.

Other: A 3.7 x 2.1 cm chronic appearing complex collection in the
subcutaneous soft tissues of the left gluteal region. Additional
fluid collection also noted more inferiorly. There is subcutaneous
dystrophic calcification. A partially visualized larger fluid
collection measuring at least 6.5 cm in the subcutaneous soft
tissues of the right buttock.

Musculoskeletal: Osteopenia. No acute osseous pathology.

Review of the MIP images confirms the above findings.
IMPRESSION: 1. No acute intrathoracic, abdominal, or pelvic pathology. No CT
evidence of aortic dissection or aneurysm.
2. A 1 cm right upper lobe nodule along the major fissure. Consider
one of the following in 3 months for both low-risk and high-risk
individuals: (a) repeat chest CT, (b) follow-up PET-CT, or (c)
tissue sampling. This recommendation follows the consensus
statement: Guidelines for Management of Incidental Pulmonary Nodules
Detected on CT Images: From the [HOSPITAL] 7220; Radiology
7220; [DATE].
3. Mild cardiomegaly with advanced 3 vessel coronary vascular
calcification.
4. Mildly enlarged right hilar lymph node, nonspecific. Clinical
correlation is recommended.
5. A 2 cm heterogeneous right thyroid nodule. Recommend thyroid US
(ref: [HOSPITAL].
6. Aortic Atherosclerosis ([id number]).

## 2022-05-10 ENCOUNTER — Other Ambulatory Visit: Payer: Self-pay | Admitting: Internal Medicine

## 2022-05-10 DIAGNOSIS — F322 Major depressive disorder, single episode, severe without psychotic features: Secondary | ICD-10-CM

## 2022-05-10 DIAGNOSIS — R1319 Other dysphagia: Secondary | ICD-10-CM

## 2022-05-10 DIAGNOSIS — I10 Essential (primary) hypertension: Secondary | ICD-10-CM

## 2022-05-20 DIAGNOSIS — I480 Paroxysmal atrial fibrillation: Secondary | ICD-10-CM | POA: Diagnosis not present

## 2022-05-20 DIAGNOSIS — I252 Old myocardial infarction: Secondary | ICD-10-CM | POA: Diagnosis not present

## 2022-05-20 DIAGNOSIS — D500 Iron deficiency anemia secondary to blood loss (chronic): Secondary | ICD-10-CM | POA: Diagnosis not present

## 2022-05-20 DIAGNOSIS — M6281 Muscle weakness (generalized): Secondary | ICD-10-CM | POA: Diagnosis not present

## 2022-05-20 DIAGNOSIS — I10 Essential (primary) hypertension: Secondary | ICD-10-CM | POA: Diagnosis not present

## 2022-05-20 DIAGNOSIS — R5381 Other malaise: Secondary | ICD-10-CM | POA: Diagnosis not present

## 2022-05-20 DIAGNOSIS — I2510 Atherosclerotic heart disease of native coronary artery without angina pectoris: Secondary | ICD-10-CM | POA: Diagnosis not present

## 2022-05-20 DIAGNOSIS — Z9981 Dependence on supplemental oxygen: Secondary | ICD-10-CM | POA: Diagnosis not present

## 2022-05-20 DIAGNOSIS — G629 Polyneuropathy, unspecified: Secondary | ICD-10-CM | POA: Diagnosis not present

## 2022-05-20 DIAGNOSIS — I5033 Acute on chronic diastolic (congestive) heart failure: Secondary | ICD-10-CM | POA: Diagnosis not present

## 2022-05-20 DIAGNOSIS — J449 Chronic obstructive pulmonary disease, unspecified: Secondary | ICD-10-CM | POA: Diagnosis not present

## 2022-05-20 DIAGNOSIS — I110 Hypertensive heart disease with heart failure: Secondary | ICD-10-CM | POA: Diagnosis not present

## 2022-05-20 DIAGNOSIS — J9601 Acute respiratory failure with hypoxia: Secondary | ICD-10-CM | POA: Diagnosis not present

## 2022-05-20 DIAGNOSIS — R4182 Altered mental status, unspecified: Secondary | ICD-10-CM | POA: Diagnosis not present

## 2022-05-20 DIAGNOSIS — R279 Unspecified lack of coordination: Secondary | ICD-10-CM | POA: Diagnosis not present

## 2022-05-20 DIAGNOSIS — J441 Chronic obstructive pulmonary disease with (acute) exacerbation: Secondary | ICD-10-CM | POA: Diagnosis not present

## 2022-05-20 DIAGNOSIS — N183 Chronic kidney disease, stage 3 unspecified: Secondary | ICD-10-CM | POA: Diagnosis not present

## 2022-05-20 DIAGNOSIS — J810 Acute pulmonary edema: Secondary | ICD-10-CM | POA: Diagnosis not present

## 2022-05-20 DIAGNOSIS — J3089 Other allergic rhinitis: Secondary | ICD-10-CM | POA: Diagnosis not present

## 2022-05-20 DIAGNOSIS — I130 Hypertensive heart and chronic kidney disease with heart failure and stage 1 through stage 4 chronic kidney disease, or unspecified chronic kidney disease: Secondary | ICD-10-CM | POA: Diagnosis not present

## 2022-05-20 DIAGNOSIS — I5032 Chronic diastolic (congestive) heart failure: Secondary | ICD-10-CM | POA: Diagnosis not present

## 2022-05-24 DIAGNOSIS — I110 Hypertensive heart disease with heart failure: Secondary | ICD-10-CM | POA: Diagnosis not present

## 2022-05-24 DIAGNOSIS — I5033 Acute on chronic diastolic (congestive) heart failure: Secondary | ICD-10-CM | POA: Diagnosis not present

## 2022-05-24 DIAGNOSIS — J9601 Acute respiratory failure with hypoxia: Secondary | ICD-10-CM | POA: Diagnosis not present

## 2022-05-24 DIAGNOSIS — I480 Paroxysmal atrial fibrillation: Secondary | ICD-10-CM | POA: Diagnosis not present

## 2022-05-24 DIAGNOSIS — J441 Chronic obstructive pulmonary disease with (acute) exacerbation: Secondary | ICD-10-CM | POA: Diagnosis not present

## 2022-05-24 DIAGNOSIS — J3089 Other allergic rhinitis: Secondary | ICD-10-CM | POA: Diagnosis not present

## 2022-05-24 DIAGNOSIS — R4182 Altered mental status, unspecified: Secondary | ICD-10-CM | POA: Diagnosis not present

## 2022-05-24 DIAGNOSIS — D500 Iron deficiency anemia secondary to blood loss (chronic): Secondary | ICD-10-CM | POA: Diagnosis not present

## 2022-05-24 DIAGNOSIS — N183 Chronic kidney disease, stage 3 unspecified: Secondary | ICD-10-CM | POA: Diagnosis not present

## 2022-05-25 DIAGNOSIS — D500 Iron deficiency anemia secondary to blood loss (chronic): Secondary | ICD-10-CM | POA: Diagnosis not present

## 2022-05-25 DIAGNOSIS — I110 Hypertensive heart disease with heart failure: Secondary | ICD-10-CM | POA: Diagnosis not present

## 2022-05-25 DIAGNOSIS — J9601 Acute respiratory failure with hypoxia: Secondary | ICD-10-CM | POA: Diagnosis not present

## 2022-05-25 DIAGNOSIS — R4182 Altered mental status, unspecified: Secondary | ICD-10-CM | POA: Diagnosis not present

## 2022-05-25 DIAGNOSIS — J3089 Other allergic rhinitis: Secondary | ICD-10-CM | POA: Diagnosis not present

## 2022-05-25 DIAGNOSIS — I480 Paroxysmal atrial fibrillation: Secondary | ICD-10-CM | POA: Diagnosis not present

## 2022-05-25 DIAGNOSIS — I5033 Acute on chronic diastolic (congestive) heart failure: Secondary | ICD-10-CM | POA: Diagnosis not present

## 2022-05-25 DIAGNOSIS — J441 Chronic obstructive pulmonary disease with (acute) exacerbation: Secondary | ICD-10-CM | POA: Diagnosis not present

## 2022-05-25 DIAGNOSIS — N183 Chronic kidney disease, stage 3 unspecified: Secondary | ICD-10-CM | POA: Diagnosis not present

## 2022-05-25 DIAGNOSIS — I10 Essential (primary) hypertension: Secondary | ICD-10-CM | POA: Diagnosis not present

## 2022-05-28 DIAGNOSIS — I5033 Acute on chronic diastolic (congestive) heart failure: Secondary | ICD-10-CM | POA: Diagnosis not present

## 2022-05-28 DIAGNOSIS — J441 Chronic obstructive pulmonary disease with (acute) exacerbation: Secondary | ICD-10-CM | POA: Diagnosis not present

## 2022-05-28 DIAGNOSIS — I480 Paroxysmal atrial fibrillation: Secondary | ICD-10-CM | POA: Diagnosis not present

## 2022-05-28 DIAGNOSIS — R4182 Altered mental status, unspecified: Secondary | ICD-10-CM | POA: Diagnosis not present

## 2022-05-28 DIAGNOSIS — J9601 Acute respiratory failure with hypoxia: Secondary | ICD-10-CM | POA: Diagnosis not present

## 2022-05-28 DIAGNOSIS — I110 Hypertensive heart disease with heart failure: Secondary | ICD-10-CM | POA: Diagnosis not present

## 2022-05-28 DIAGNOSIS — D500 Iron deficiency anemia secondary to blood loss (chronic): Secondary | ICD-10-CM | POA: Diagnosis not present

## 2022-05-28 DIAGNOSIS — J3089 Other allergic rhinitis: Secondary | ICD-10-CM | POA: Diagnosis not present

## 2022-05-28 DIAGNOSIS — N183 Chronic kidney disease, stage 3 unspecified: Secondary | ICD-10-CM | POA: Diagnosis not present

## 2022-05-29 ENCOUNTER — Other Ambulatory Visit: Payer: Self-pay | Admitting: Internal Medicine

## 2022-05-29 DIAGNOSIS — J449 Chronic obstructive pulmonary disease, unspecified: Secondary | ICD-10-CM

## 2022-05-31 DIAGNOSIS — R4182 Altered mental status, unspecified: Secondary | ICD-10-CM | POA: Diagnosis not present

## 2022-05-31 DIAGNOSIS — J3089 Other allergic rhinitis: Secondary | ICD-10-CM | POA: Diagnosis not present

## 2022-05-31 DIAGNOSIS — I110 Hypertensive heart disease with heart failure: Secondary | ICD-10-CM | POA: Diagnosis not present

## 2022-05-31 DIAGNOSIS — I5033 Acute on chronic diastolic (congestive) heart failure: Secondary | ICD-10-CM | POA: Diagnosis not present

## 2022-05-31 DIAGNOSIS — J9601 Acute respiratory failure with hypoxia: Secondary | ICD-10-CM | POA: Diagnosis not present

## 2022-05-31 DIAGNOSIS — I480 Paroxysmal atrial fibrillation: Secondary | ICD-10-CM | POA: Diagnosis not present

## 2022-05-31 DIAGNOSIS — D500 Iron deficiency anemia secondary to blood loss (chronic): Secondary | ICD-10-CM | POA: Diagnosis not present

## 2022-05-31 DIAGNOSIS — N183 Chronic kidney disease, stage 3 unspecified: Secondary | ICD-10-CM | POA: Diagnosis not present

## 2022-05-31 DIAGNOSIS — J441 Chronic obstructive pulmonary disease with (acute) exacerbation: Secondary | ICD-10-CM | POA: Diagnosis not present

## 2022-05-31 NOTE — Telephone Encounter (Signed)
Requested Prescriptions  Pending Prescriptions Disp Refills   albuterol (PROVENTIL) (2.5 MG/3ML) 0.083% nebulizer solution [Pharmacy Med Name: ALBUTEROL SULFATE (2.5 MG/3ML) 0.083% Nebulization Solution] 540 mL 0    Sig: INHALE THE CONTENTS OF 1 VIAL VIA NEBULIZER EVERY 6 HOURS AS NEEDED FOR SHORTNESS OF BREATH OR FOR WHEEZING     Pulmonology:  Beta Agonists 2 Passed - 05/29/2022  2:29 AM      Passed - Last BP in normal range    BP Readings from Last 1 Encounters:  04/06/22 129/72         Passed - Last Heart Rate in normal range    Pulse Readings from Last 1 Encounters:  04/06/22 69         Passed - Valid encounter within last 12 months    Recent Outpatient Visits           1 month ago Acute exacerbation of chronic obstructive pulmonary disease (COPD) ([place])   [place] [facility] at [facility], Jesse Sans, MD   6 months ago Chronic obstructive pulmonary disease, unspecified COPD type [facility])   [place] at [facility], Jesse Sans, MD   1 year ago RUQ abdominal pain   [place] [facility] [place] [facility] [place], Jesse Sans, MD   1 year ago Essential (primary) hypertension   [place] [facility] [place] [facility], Jesse Sans, MD   2 years ago Essential (primary) hypertension   [place] at [facility], Jesse Sans, MD

## 2022-06-01 DIAGNOSIS — N183 Chronic kidney disease, stage 3 unspecified: Secondary | ICD-10-CM | POA: Diagnosis not present

## 2022-06-04 DIAGNOSIS — N183 Chronic kidney disease, stage 3 unspecified: Secondary | ICD-10-CM | POA: Diagnosis not present

## 2022-06-04 DIAGNOSIS — I110 Hypertensive heart disease with heart failure: Secondary | ICD-10-CM | POA: Diagnosis not present

## 2022-06-04 DIAGNOSIS — J441 Chronic obstructive pulmonary disease with (acute) exacerbation: Secondary | ICD-10-CM | POA: Diagnosis not present

## 2022-06-04 DIAGNOSIS — I480 Paroxysmal atrial fibrillation: Secondary | ICD-10-CM | POA: Diagnosis not present

## 2022-06-04 DIAGNOSIS — J3089 Other allergic rhinitis: Secondary | ICD-10-CM | POA: Diagnosis not present

## 2022-06-04 DIAGNOSIS — J9601 Acute respiratory failure with hypoxia: Secondary | ICD-10-CM | POA: Diagnosis not present

## 2022-06-04 DIAGNOSIS — D500 Iron deficiency anemia secondary to blood loss (chronic): Secondary | ICD-10-CM | POA: Diagnosis not present

## 2022-06-04 DIAGNOSIS — I5033 Acute on chronic diastolic (congestive) heart failure: Secondary | ICD-10-CM | POA: Diagnosis not present

## 2022-06-04 DIAGNOSIS — R4182 Altered mental status, unspecified: Secondary | ICD-10-CM | POA: Diagnosis not present

## 2022-06-05 IMAGING — US US THYROID
1 series · 13 of 25 positions shown · non-contrast
Comparison: None.

CLINICAL DATA: Thyroid nodule seen on CT.

EXAM:
THYROID ULTRASOUND
TECHNIQUE: Ultrasound examination of the thyroid gland and adjacent soft
tissues was performed.

[Series 1: us thyroid · 0.07mm/px · 13 of 59 slices shown]
[im 1/59]
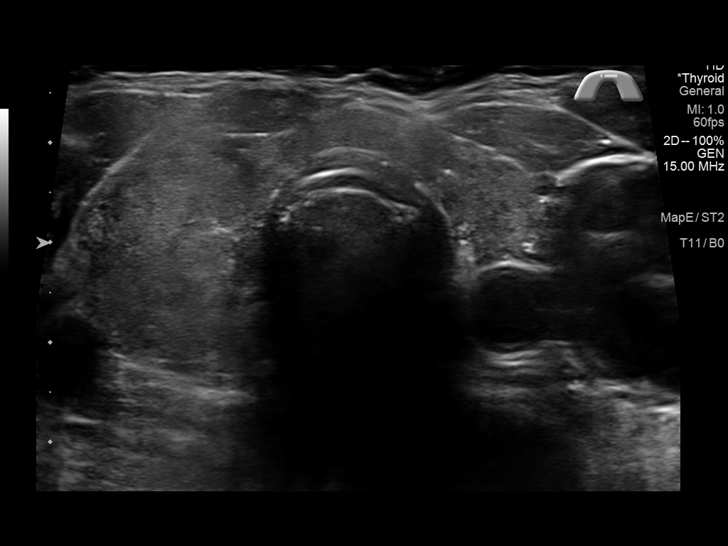
[im 5/59]
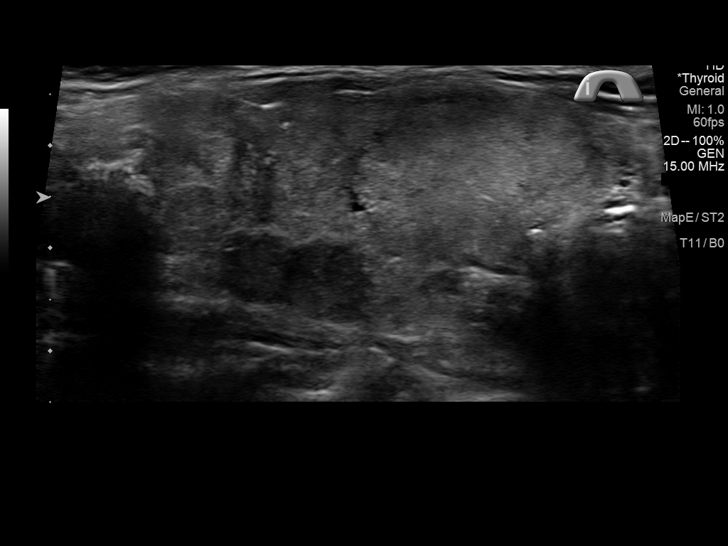
[im 10/59]
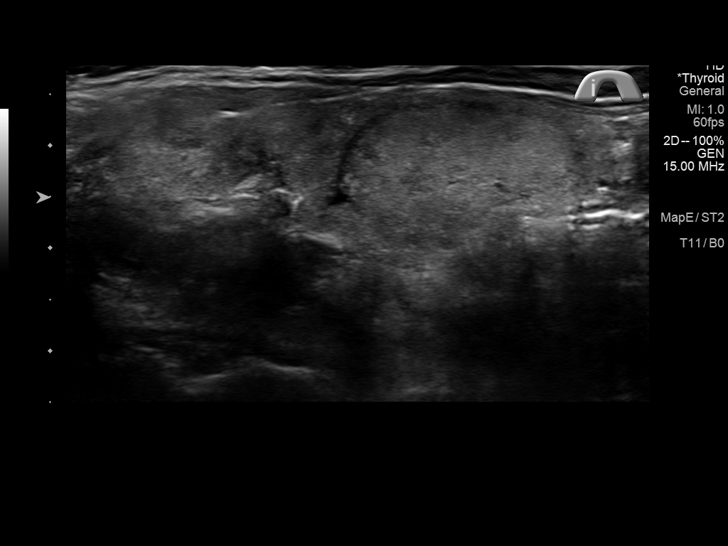
[im 15/59]
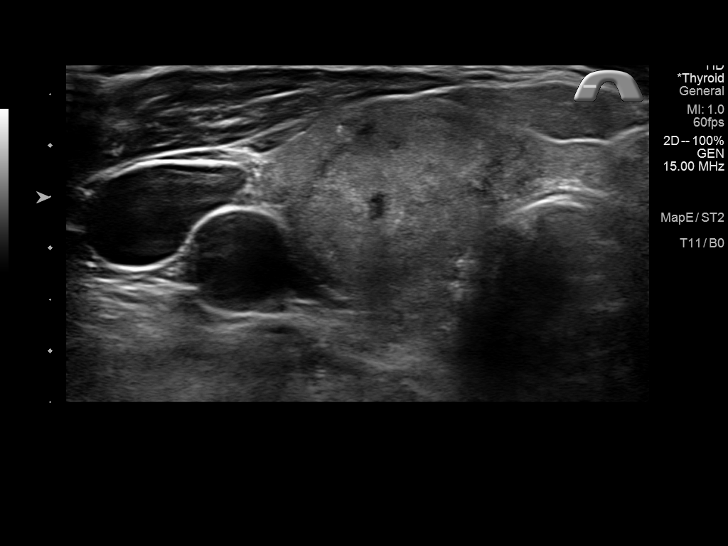
[im 20/59]
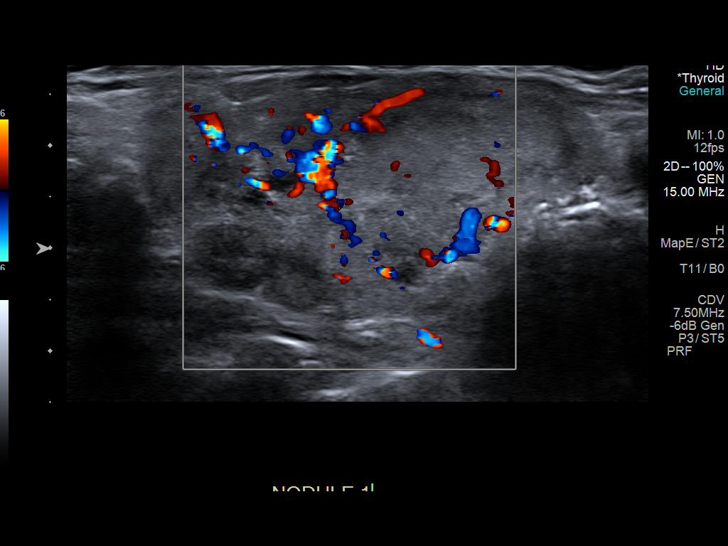
[im 25/59]
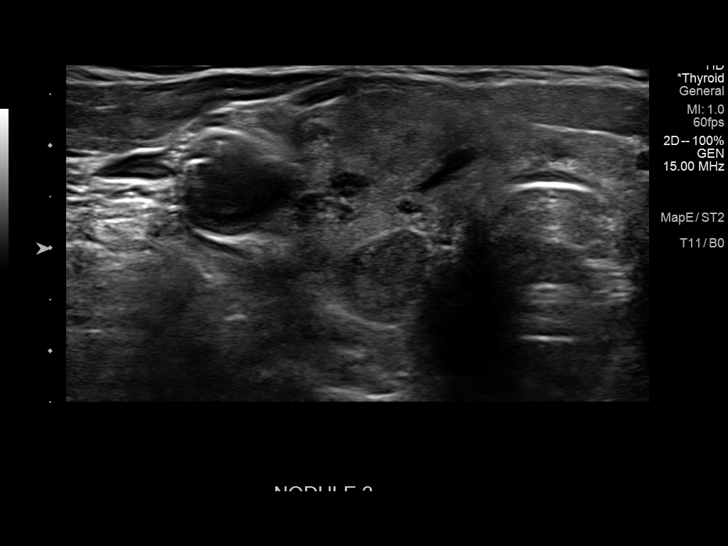
[im 30/59]
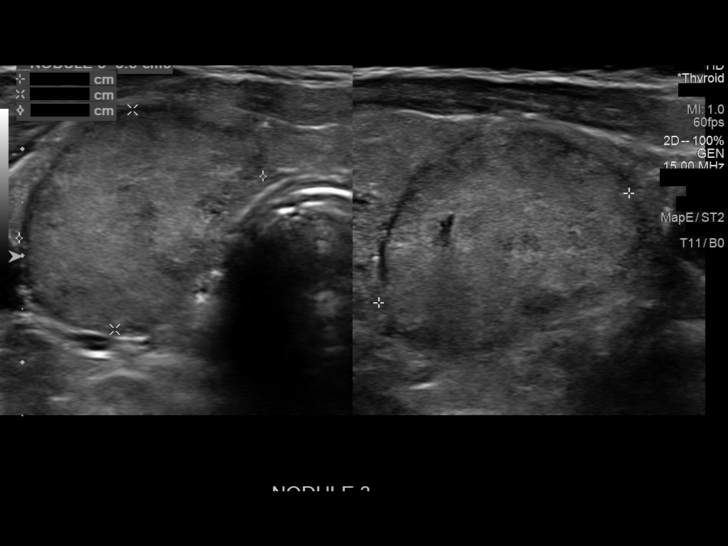
[im 34/59]
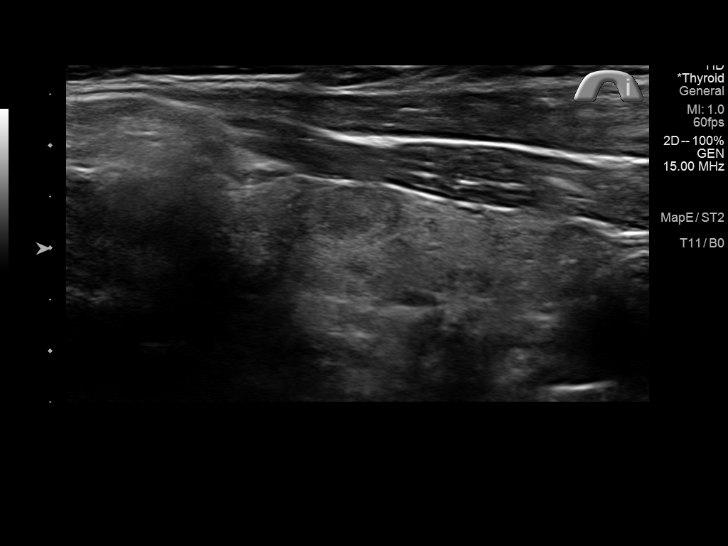
[im 39/59]
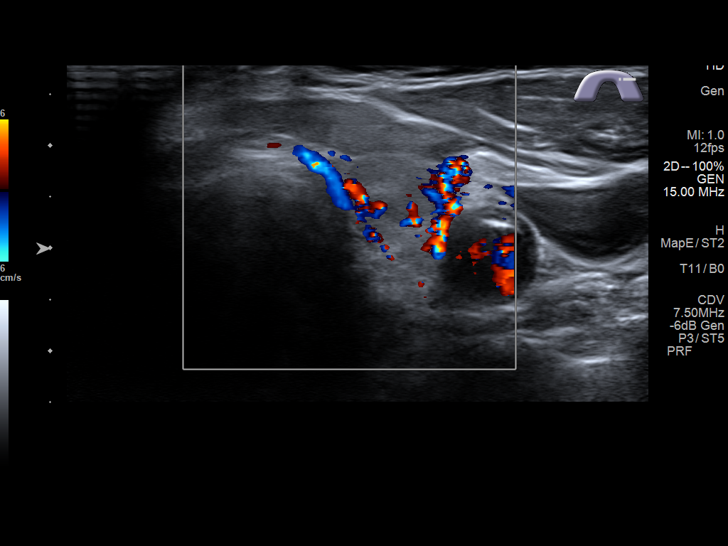
[im 44/59]
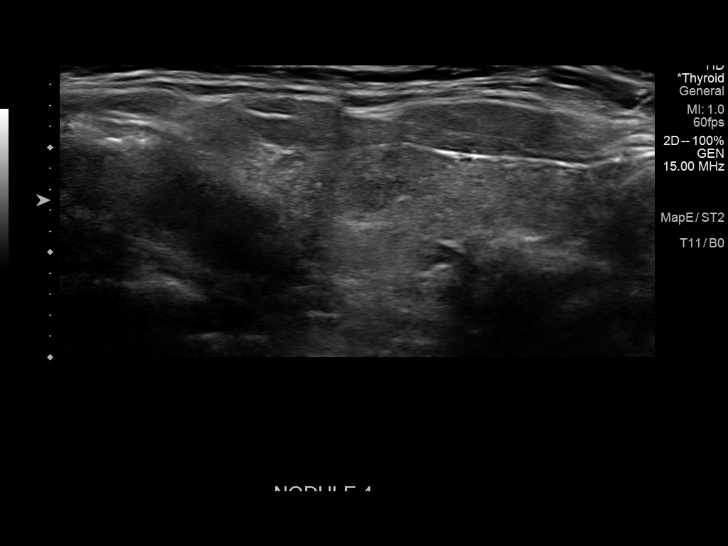
[im 49/59]
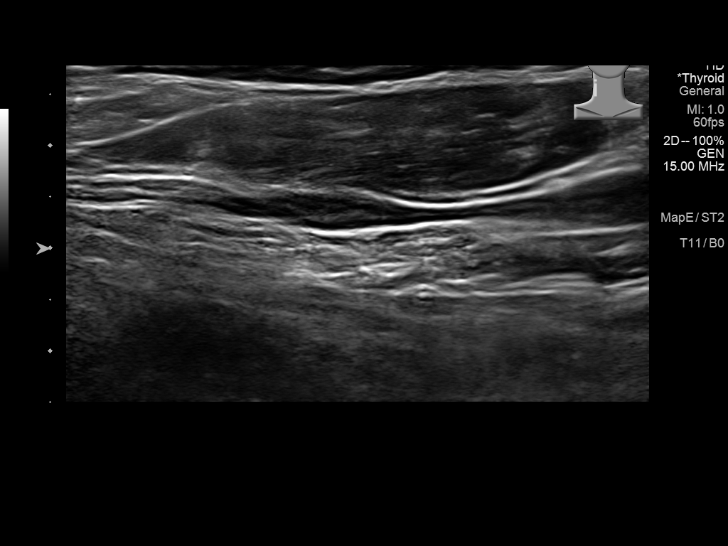
[im 54/59]
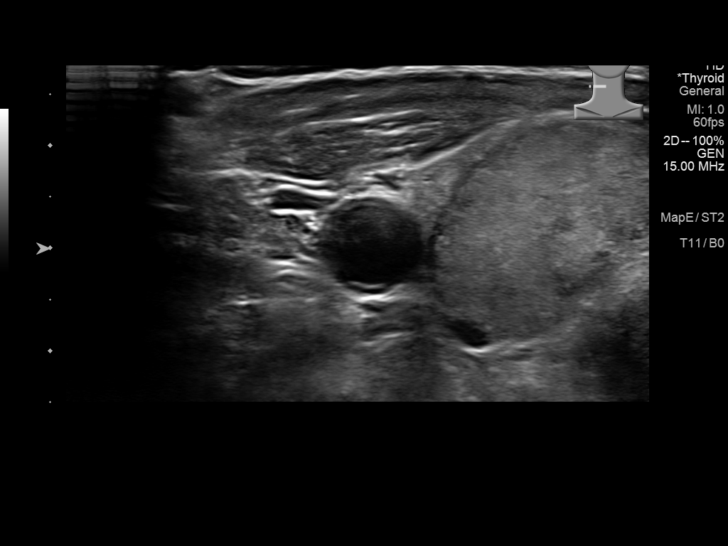
[im 59/59]
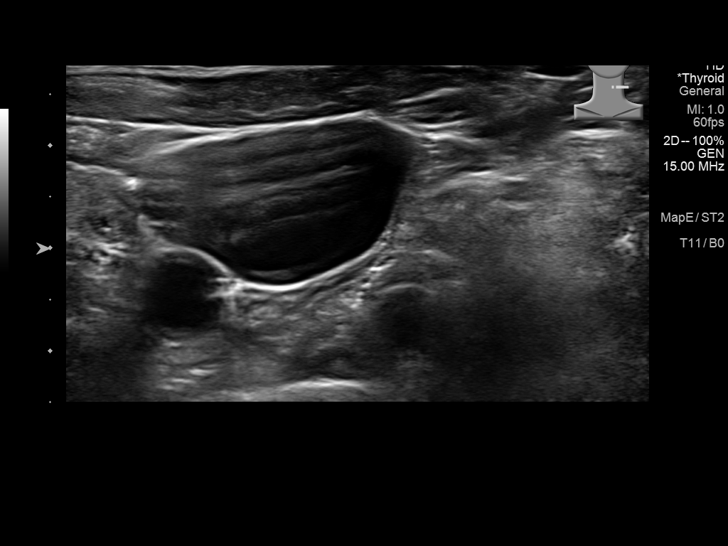

[13 of 25 positions shown; findings below may reference images not displayed]

FINDINGS: Parenchymal Echotexture: Moderately heterogenous

Isthmus: 0.5 cm

Right lobe: 4.6 x 2.3 x 2.2 cm

Left lobe: 3.3 x 1.1 x 1.6 cm

_________________________________________________________

Estimated total number of nodules >/= 1 cm: 2

Number of spongiform nodules >/=  2 cm not described below (TR1): 0

Number of mixed cystic and solid nodules >/= 1.5 cm not described
below (TR2): 0

_________________________________________________________

Nodule # 3:

Location: Right; Inferior

Maximum size: 2.6 cm; Other 2 dimensions: 2.2 x 2.4 cm

Composition: solid/almost completely solid (2)

Echogenicity: isoechoic (1)

Shape: not taller-than-wide (0)

Margins: ill-defined (0)

Echogenic foci: none (0)

ACR TI-RADS total points: 3.

ACR TI-RADS risk category: TR3 (3 points).

ACR TI-RADS recommendations:

**Given size (>/= 2.5 cm) and appearance, fine needle aspiration of
this mildly suspicious nodule should be considered based on TI-RADS
criteria.

_________________________________________________________

There are additional bilateral thyroid nodules measuring up to
approximately 1 cm that do not currently meet criteria for
fine-needle aspiration or follow-up ultrasound.
IMPRESSION: 1. Moderately heterogeneous thyroid gland with multiple distinct
thyroid nodules.
2. There is a 2.6 cm thyroid nodule in the inferior right thyroid
gland. Fine-needle aspiration is recommended for this thyroid
nodule.
3. There are additional small bilateral thyroid nodules that do not
currently meet criteria for follow-up or fine-needle aspiration.

The above is in keeping with the ACR TI-RADS recommendations - [HOSPITAL] 9428;[DATE].

## 2022-06-08 ENCOUNTER — Inpatient Hospital Stay
Admit: 2022-06-08 | Discharge: 2022-06-08 | Disposition: A | Discharge status: Home / Self Care | Payer: Medicare HMO | Attending: Cardiology | Admitting: Cardiology

## 2022-06-08 ENCOUNTER — Encounter: Payer: Self-pay | Admitting: Family Medicine

## 2022-06-08 ENCOUNTER — Other Ambulatory Visit: Payer: Self-pay

## 2022-06-08 ENCOUNTER — Emergency Department: Payer: Medicare HMO

## 2022-06-08 ENCOUNTER — Inpatient Hospital Stay
Admission: EM | Admit: 2022-06-08 | Discharge: 2022-06-15 | DRG: 291 | Disposition: A | Discharge status: Home Health | Payer: Medicare HMO | Attending: Hospitalist | Admitting: Hospitalist

## 2022-06-08 DIAGNOSIS — I272 Pulmonary hypertension, unspecified: Secondary | ICD-10-CM | POA: Diagnosis not present

## 2022-06-08 DIAGNOSIS — I495 Sick sinus syndrome: Secondary | ICD-10-CM | POA: Diagnosis not present

## 2022-06-08 DIAGNOSIS — Z833 Family history of diabetes mellitus: Secondary | ICD-10-CM

## 2022-06-08 DIAGNOSIS — I2723 Pulmonary hypertension due to lung diseases and hypoxia: Secondary | ICD-10-CM | POA: Diagnosis not present

## 2022-06-08 DIAGNOSIS — J441 Chronic obstructive pulmonary disease with (acute) exacerbation: Secondary | ICD-10-CM | POA: Diagnosis not present

## 2022-06-08 DIAGNOSIS — D631 Anemia in chronic kidney disease: Secondary | ICD-10-CM | POA: Diagnosis present

## 2022-06-08 DIAGNOSIS — J9621 Acute and chronic respiratory failure with hypoxia: Secondary | ICD-10-CM | POA: Diagnosis present

## 2022-06-08 DIAGNOSIS — N179 Acute kidney failure, unspecified: Secondary | ICD-10-CM | POA: Diagnosis not present

## 2022-06-08 DIAGNOSIS — Y92009 Unspecified place in unspecified non-institutional (private) residence as the place of occurrence of the external cause: Secondary | ICD-10-CM

## 2022-06-08 DIAGNOSIS — Z9981 Dependence on supplemental oxygen: Secondary | ICD-10-CM

## 2022-06-08 DIAGNOSIS — I2489 Other forms of acute ischemic heart disease: Secondary | ICD-10-CM | POA: Diagnosis not present

## 2022-06-08 DIAGNOSIS — I160 Hypertensive urgency: Secondary | ICD-10-CM | POA: Diagnosis not present

## 2022-06-08 DIAGNOSIS — Z825 Family history of asthma and other chronic lower respiratory diseases: Secondary | ICD-10-CM

## 2022-06-08 DIAGNOSIS — Z880 Allergy status to penicillin: Secondary | ICD-10-CM

## 2022-06-08 DIAGNOSIS — I5023 Acute on chronic systolic (congestive) heart failure: Secondary | ICD-10-CM

## 2022-06-08 DIAGNOSIS — E785 Hyperlipidemia, unspecified: Secondary | ICD-10-CM | POA: Diagnosis present

## 2022-06-08 DIAGNOSIS — I5021 Acute systolic (congestive) heart failure: Secondary | ICD-10-CM | POA: Diagnosis not present

## 2022-06-08 DIAGNOSIS — N1831 Chronic kidney disease, stage 3a: Secondary | ICD-10-CM | POA: Diagnosis present

## 2022-06-08 DIAGNOSIS — E876 Hypokalemia: Secondary | ICD-10-CM | POA: Diagnosis present

## 2022-06-08 DIAGNOSIS — M1990 Unspecified osteoarthritis, unspecified site: Secondary | ICD-10-CM | POA: Diagnosis present

## 2022-06-08 DIAGNOSIS — I2510 Atherosclerotic heart disease of native coronary artery without angina pectoris: Secondary | ICD-10-CM | POA: Diagnosis present

## 2022-06-08 DIAGNOSIS — I480 Paroxysmal atrial fibrillation: Secondary | ICD-10-CM | POA: Diagnosis present

## 2022-06-08 DIAGNOSIS — I509 Heart failure, unspecified: Secondary | ICD-10-CM

## 2022-06-08 DIAGNOSIS — F32A Depression, unspecified: Secondary | ICD-10-CM | POA: Diagnosis present

## 2022-06-08 DIAGNOSIS — Z87891 Personal history of nicotine dependence: Secondary | ICD-10-CM

## 2022-06-08 DIAGNOSIS — R0902 Hypoxemia: Secondary | ICD-10-CM | POA: Diagnosis not present

## 2022-06-08 DIAGNOSIS — W19XXXA Unspecified fall, initial encounter: Secondary | ICD-10-CM

## 2022-06-08 DIAGNOSIS — F0280 Dementia in other diseases classified elsewhere without behavioral disturbance: Secondary | ICD-10-CM | POA: Diagnosis present

## 2022-06-08 DIAGNOSIS — I130 Hypertensive heart and chronic kidney disease with heart failure and stage 1 through stage 4 chronic kidney disease, or unspecified chronic kidney disease: Secondary | ICD-10-CM | POA: Diagnosis not present

## 2022-06-08 DIAGNOSIS — F0390 Unspecified dementia without behavioral disturbance: Secondary | ICD-10-CM | POA: Diagnosis not present

## 2022-06-08 DIAGNOSIS — N183 Chronic kidney disease, stage 3 unspecified: Secondary | ICD-10-CM | POA: Diagnosis not present

## 2022-06-08 DIAGNOSIS — I2729 Other secondary pulmonary hypertension: Secondary | ICD-10-CM | POA: Diagnosis not present

## 2022-06-08 DIAGNOSIS — B9620 Unspecified Escherichia coli [E. coli] as the cause of diseases classified elsewhere: Secondary | ICD-10-CM | POA: Diagnosis present

## 2022-06-08 DIAGNOSIS — I252 Old myocardial infarction: Secondary | ICD-10-CM

## 2022-06-08 DIAGNOSIS — H9190 Unspecified hearing loss, unspecified ear: Secondary | ICD-10-CM | POA: Diagnosis present

## 2022-06-08 DIAGNOSIS — A419 Sepsis, unspecified organism: Secondary | ICD-10-CM | POA: Diagnosis not present

## 2022-06-08 DIAGNOSIS — I10 Essential (primary) hypertension: Secondary | ICD-10-CM | POA: Diagnosis not present

## 2022-06-08 DIAGNOSIS — Z743 Need for continuous supervision: Secondary | ICD-10-CM | POA: Diagnosis not present

## 2022-06-08 DIAGNOSIS — G309 Alzheimer's disease, unspecified: Secondary | ICD-10-CM | POA: Diagnosis present

## 2022-06-08 DIAGNOSIS — Z79899 Other long term (current) drug therapy: Secondary | ICD-10-CM

## 2022-06-08 DIAGNOSIS — N390 Urinary tract infection, site not specified: Secondary | ICD-10-CM

## 2022-06-08 DIAGNOSIS — R0602 Shortness of breath: Secondary | ICD-10-CM | POA: Diagnosis present

## 2022-06-08 DIAGNOSIS — I5043 Acute on chronic combined systolic (congestive) and diastolic (congestive) heart failure: Secondary | ICD-10-CM | POA: Diagnosis present

## 2022-06-08 DIAGNOSIS — Z1152 Encounter for screening for COVID-19: Secondary | ICD-10-CM

## 2022-06-08 DIAGNOSIS — Z8249 Family history of ischemic heart disease and other diseases of the circulatory system: Secondary | ICD-10-CM

## 2022-06-08 DIAGNOSIS — Z881 Allergy status to other antibiotic agents status: Secondary | ICD-10-CM

## 2022-06-08 DIAGNOSIS — J449 Chronic obstructive pulmonary disease, unspecified: Secondary | ICD-10-CM | POA: Diagnosis present

## 2022-06-08 DIAGNOSIS — Z9181 History of falling: Secondary | ICD-10-CM

## 2022-06-08 DIAGNOSIS — Z809 Family history of malignant neoplasm, unspecified: Secondary | ICD-10-CM

## 2022-06-08 DIAGNOSIS — I5032 Chronic diastolic (congestive) heart failure: Secondary | ICD-10-CM

## 2022-06-08 DIAGNOSIS — J9601 Acute respiratory failure with hypoxia: Principal | ICD-10-CM

## 2022-06-08 DIAGNOSIS — I110 Hypertensive heart disease with heart failure: Secondary | ICD-10-CM | POA: Diagnosis not present

## 2022-06-08 DIAGNOSIS — Z888 Allergy status to other drugs, medicaments and biological substances status: Secondary | ICD-10-CM

## 2022-06-08 DIAGNOSIS — G629 Polyneuropathy, unspecified: Secondary | ICD-10-CM | POA: Diagnosis present

## 2022-06-08 DIAGNOSIS — T502X5A Adverse effect of carbonic-anhydrase inhibitors, benzothiadiazides and other diuretics, initial encounter: Secondary | ICD-10-CM | POA: Diagnosis not present

## 2022-06-08 DIAGNOSIS — I5033 Acute on chronic diastolic (congestive) heart failure: Secondary | ICD-10-CM | POA: Diagnosis not present

## 2022-06-08 DIAGNOSIS — Z7951 Long term (current) use of inhaled steroids: Secondary | ICD-10-CM

## 2022-06-08 DIAGNOSIS — K219 Gastro-esophageal reflux disease without esophagitis: Secondary | ICD-10-CM | POA: Diagnosis present

## 2022-06-08 DIAGNOSIS — Z66 Do not resuscitate: Secondary | ICD-10-CM | POA: Diagnosis not present

## 2022-06-08 DIAGNOSIS — M549 Dorsalgia, unspecified: Secondary | ICD-10-CM | POA: Diagnosis not present

## 2022-06-08 DIAGNOSIS — R079 Chest pain, unspecified: Secondary | ICD-10-CM | POA: Diagnosis not present

## 2022-06-08 DIAGNOSIS — Z7982 Long term (current) use of aspirin: Secondary | ICD-10-CM

## 2022-06-08 DIAGNOSIS — M546 Pain in thoracic spine: Secondary | ICD-10-CM | POA: Diagnosis not present

## 2022-06-08 LAB — RESP PANEL BY RT-PCR (RSV, FLU A&B, COVID)  RVPGX2
Influenza A by PCR: NEGATIVE
Influenza B by PCR: NEGATIVE
Resp Syncytial Virus by PCR: NEGATIVE
SARS Coronavirus 2 by RT PCR: NEGATIVE

## 2022-06-08 LAB — COMPREHENSIVE METABOLIC PANEL
ALT: 12 U/L (ref 0–44)
AST: 19 U/L (ref 15–41)
Albumin: 3.2 g/dL — ABNORMAL LOW (ref 3.5–5.0)
Alkaline Phosphatase: 67 U/L (ref 38–126)
Anion gap: 11 (ref 5–15)
BUN: 14 mg/dL (ref 8–23)
CO2: 21 mmol/L — ABNORMAL LOW (ref 22–32)
Calcium: 9.3 mg/dL (ref 8.9–10.3)
Chloride: 102 mmol/L (ref 98–111)
Creatinine, Ser: 1.2 mg/dL — ABNORMAL HIGH (ref 0.44–1.00)
GFR, Estimated: 44 mL/min — ABNORMAL LOW (ref >60)
Glucose, Bld: 99 mg/dL (ref 70–99)
Potassium: 3.7 mmol/L (ref 3.5–5.1)
Sodium: 134 mmol/L — ABNORMAL LOW (ref 135–145)
Total Bilirubin: 0.7 mg/dL (ref 0.3–1.2)
Total Protein: 6.4 g/dL — ABNORMAL LOW (ref 6.5–8.1)

## 2022-06-08 LAB — CBC WITH DIFFERENTIAL/PLATELET
Abs Immature Granulocytes: 0.03 10*3/uL (ref 0.00–0.07)
Abs Immature Granulocytes: 0.03 10*3/uL (ref 0.00–0.07)
Basophils Absolute: 0.1 10*3/uL (ref 0.0–0.1)
Basophils Absolute: 0.1 10*3/uL (ref 0.0–0.1)
Basophils Relative: 1 %
Basophils Relative: 1 %
Eosinophils Absolute: 0 10*3/uL (ref 0.0–0.5)
Eosinophils Absolute: 0 10*3/uL (ref 0.0–0.5)
Eosinophils Relative: 0 %
Eosinophils Relative: 0 %
HCT: 30.8 % — ABNORMAL LOW (ref 36.0–46.0)
HCT: 33.4 % — ABNORMAL LOW (ref 36.0–46.0)
Hemoglobin: 9.6 g/dL — ABNORMAL LOW (ref 12.0–15.0)
Hemoglobin: 10.4 g/dL — ABNORMAL LOW (ref 12.0–15.0)
Immature Granulocytes: 0 %
Immature Granulocytes: 0 %
Lymphocytes Relative: 19 %
Lymphocytes Relative: 20 %
Lymphs Abs: 1.7 10*3/uL (ref 0.7–4.0)
Lymphs Abs: 1.6 10*3/uL (ref 0.7–4.0)
MCH: 26.4 pg (ref 26.0–34.0)
MCH: 26.3 pg (ref 26.0–34.0)
MCHC: 31.2 g/dL (ref 30.0–36.0)
MCHC: 31.1 g/dL (ref 30.0–36.0)
MCV: 84.8 fL (ref 80.0–100.0)
MCV: 84.6 fL (ref 80.0–100.0)
Monocytes Absolute: 0.9 10*3/uL (ref 0.1–1.0)
Monocytes Absolute: 0.9 10*3/uL (ref 0.1–1.0)
Monocytes Relative: 11 %
Monocytes Relative: 11 %
Neutro Abs: 6 10*3/uL (ref 1.7–7.7)
Neutro Abs: 5.5 10*3/uL (ref 1.7–7.7)
Neutrophils Relative %: 69 %
Neutrophils Relative %: 68 %
Platelets: 252 10*3/uL (ref 150–400)
Platelets: UNDETERMINED 10*3/uL (ref 150–400)
RBC: 3.63 MIL/uL — ABNORMAL LOW (ref 3.87–5.11)
RBC: 3.95 MIL/uL (ref 3.87–5.11)
RDW: 15.7 % — ABNORMAL HIGH (ref 11.5–15.5)
RDW: 15.5 % (ref 11.5–15.5)
WBC: 8.7 10*3/uL (ref 4.0–10.5)
WBC: 8.1 10*3/uL (ref 4.0–10.5)
nRBC: 0 % (ref 0.0–0.2)
nRBC: 0 % (ref 0.0–0.2)

## 2022-06-08 LAB — LACTIC ACID, PLASMA: Lactic Acid, Venous: 0.7 mmol/L (ref 0.5–1.9)

## 2022-06-08 LAB — URINALYSIS, W/ REFLEX TO CULTURE (INFECTION SUSPECTED)
Bilirubin Urine: NEGATIVE
Glucose, UA: NEGATIVE mg/dL
Hgb urine dipstick: NEGATIVE
Ketones, ur: NEGATIVE mg/dL
Nitrite: POSITIVE — AB
Protein, ur: 100 mg/dL — AB
Specific Gravity, Urine: 1.003 — ABNORMAL LOW (ref 1.005–1.030)
pH: 7 (ref 5.0–8.0)

## 2022-06-08 LAB — BRAIN NATRIURETIC PEPTIDE: B Natriuretic Peptide: 1226.2 pg/mL — ABNORMAL HIGH (ref 0.0–100.0)

## 2022-06-08 LAB — BLOOD GAS, VENOUS
Acid-Base Excess: 2 mmol/L (ref 0.0–2.0)
Bicarbonate: 25.6 mmol/L (ref 20.0–28.0)
O2 Saturation: 73 %
Patient temperature: 37
pCO2, Ven: 36 mmHg — ABNORMAL LOW (ref 44–60)
pH, Ven: 7.46 — ABNORMAL HIGH (ref 7.25–7.43)
pO2, Ven: 45 mmHg (ref 32–45)

## 2022-06-08 LAB — PROTIME-INR
INR: 1.2 (ref 0.8–1.2)
Prothrombin Time: 14.7 seconds (ref 11.4–15.2)

## 2022-06-08 LAB — MRSA NEXT GEN BY PCR, NASAL: MRSA by PCR Next Gen: NOT DETECTED

## 2022-06-08 LAB — TROPONIN I (HIGH SENSITIVITY)
Troponin I (High Sensitivity): 33 ng/L — ABNORMAL HIGH (ref <18)
Troponin I (High Sensitivity): 34 ng/L — ABNORMAL HIGH (ref <18)

## 2022-06-08 MED ORDER — CEFDINIR 300 MG PO CAPS
300.0000 mg | ORAL_CAPSULE | Freq: Every day | ORAL | Status: DC
  Administered 2022-06-08 – 2022-06-09 (×2): 300 mg via ORAL
  Filled 2022-06-08 (×2): qty 1

## 2022-06-08 MED ORDER — MAGNESIUM HYDROXIDE 400 MG/5ML PO SUSP: 30.0000 mL | Freq: Every day | ORAL | Status: DC | PRN

## 2022-06-08 MED ORDER — SPIRONOLACTONE 25 MG PO TABS
25.0000 mg | ORAL_TABLET | Freq: Every day | ORAL | Status: DC
  Administered 2022-06-08 – 2022-06-12 (×5): 25 mg via ORAL
  Filled 2022-06-08 (×5): qty 1

## 2022-06-08 MED ORDER — LISINOPRIL 20 MG PO TABS
40.0000 mg | ORAL_TABLET | Freq: Every day | ORAL | Status: DC
  Administered 2022-06-08 – 2022-06-09 (×2): 40 mg via ORAL
  Filled 2022-06-08 (×2): qty 2
  Filled 2022-06-08: qty 4

## 2022-06-08 MED ORDER — ENOXAPARIN SODIUM 30 MG/0.3ML IJ SOSY
30.0000 mg | PREFILLED_SYRINGE | INTRAMUSCULAR | Status: DC
  Administered 2022-06-08 – 2022-06-15 (×8): 30 mg via SUBCUTANEOUS
  Filled 2022-06-08 (×8): qty 0.3

## 2022-06-08 MED ORDER — CLONIDINE HCL 0.1 MG PO TABS
0.1000 mg | ORAL_TABLET | Freq: Two times a day (BID) | ORAL | Status: DC
  Administered 2022-06-08 – 2022-06-15 (×15): 0.1 mg via ORAL
  Filled 2022-06-08 (×15): qty 1

## 2022-06-08 MED ORDER — DICYCLOMINE HCL 10 MG PO CAPS
10.0000 mg | ORAL_CAPSULE | Freq: Three times a day (TID) | ORAL | Status: DC
  Administered 2022-06-08 – 2022-06-15 (×28): 10 mg via ORAL
  Filled 2022-06-08 (×32): qty 1

## 2022-06-08 MED ORDER — ONDANSETRON HCL 4 MG PO TABS: 4.0000 mg | ORAL_TABLET | Freq: Four times a day (QID) | ORAL | Status: DC | PRN

## 2022-06-08 MED ORDER — ACETAMINOPHEN 325 MG PO TABS
650.0000 mg | ORAL_TABLET | Freq: Four times a day (QID) | ORAL | Status: DC | PRN
  Administered 2022-06-08 – 2022-06-13 (×3): 650 mg via ORAL
  Filled 2022-06-08 (×3): qty 2

## 2022-06-08 MED ORDER — MONTELUKAST SODIUM 10 MG PO TABS
10.0000 mg | ORAL_TABLET | Freq: Every day | ORAL | Status: DC
  Administered 2022-06-08 – 2022-06-14 (×7): 10 mg via ORAL
  Filled 2022-06-08 (×7): qty 1

## 2022-06-08 MED ORDER — FUROSEMIDE 10 MG/ML IJ SOLN
20.0000 mg | Freq: Once | INTRAMUSCULAR | Status: AC
  Administered 2022-06-08: 20 mg via INTRAVENOUS
  Filled 2022-06-08: qty 4

## 2022-06-08 MED ORDER — BUDESONIDE 0.5 MG/2ML IN SUSP
0.5000 mg | Freq: Two times a day (BID) | RESPIRATORY_TRACT | Status: DC
  Administered 2022-06-08 – 2022-06-15 (×15): 0.5 mg via RESPIRATORY_TRACT
  Filled 2022-06-08 (×15): qty 2

## 2022-06-08 MED ORDER — ONDANSETRON HCL 4 MG/2ML IJ SOLN: 4.0000 mg | Freq: Four times a day (QID) | INTRAMUSCULAR | Status: DC | PRN

## 2022-06-08 MED ORDER — VITAMIN B-12 1000 MCG PO TABS
1000.0000 ug | ORAL_TABLET | Freq: Every day | ORAL | Status: DC
  Administered 2022-06-08 – 2022-06-15 (×8): 1000 ug via ORAL
  Filled 2022-06-08 (×8): qty 1

## 2022-06-08 MED ORDER — SODIUM CHLORIDE 0.9 % IV SOLN
2.0000 g | Freq: Three times a day (TID) | INTRAVENOUS | Status: DC
  Administered 2022-06-08: 2 g via INTRAVENOUS
  Filled 2022-06-08: qty 10

## 2022-06-08 MED ORDER — FUROSEMIDE 10 MG/ML IJ SOLN
20.0000 mg | Freq: Two times a day (BID) | INTRAMUSCULAR | Status: DC
  Administered 2022-06-08 – 2022-06-10 (×5): 20 mg via INTRAVENOUS
  Filled 2022-06-08: qty 4
  Filled 2022-06-08 (×4): qty 2

## 2022-06-08 MED ORDER — FERROUS GLUCONATE 324 (38 FE) MG PO TABS
324.0000 mg | ORAL_TABLET | Freq: Every day | ORAL | Status: DC
  Administered 2022-06-08 – 2022-06-15 (×8): 324 mg via ORAL
  Filled 2022-06-08 (×8): qty 1

## 2022-06-08 MED ORDER — PANTOPRAZOLE SODIUM 40 MG PO TBEC
40.0000 mg | DELAYED_RELEASE_TABLET | Freq: Every day | ORAL | Status: DC
  Administered 2022-06-08 – 2022-06-15 (×8): 40 mg via ORAL
  Filled 2022-06-08 (×8): qty 1

## 2022-06-08 MED ORDER — FLUTICASONE PROPIONATE 50 MCG/ACT NA SUSP
2.0000 | Freq: Every day | NASAL | Status: DC
  Administered 2022-06-09 – 2022-06-15 (×7): 2 via NASAL
  Filled 2022-06-08: qty 16

## 2022-06-08 MED ORDER — TRAZODONE HCL 50 MG PO TABS
25.0000 mg | ORAL_TABLET | Freq: Every evening | ORAL | Status: DC | PRN
  Administered 2022-06-12: 25 mg via ORAL
  Filled 2022-06-08: qty 1

## 2022-06-08 MED ORDER — VITAMIN C 500 MG PO TABS
500.0000 mg | ORAL_TABLET | Freq: Every day | ORAL | Status: DC
  Administered 2022-06-08 – 2022-06-15 (×8): 500 mg via ORAL
  Filled 2022-06-08 (×8): qty 1

## 2022-06-08 MED ORDER — METOPROLOL SUCCINATE ER 100 MG PO TB24
200.0000 mg | ORAL_TABLET | Freq: Every day | ORAL | Status: DC
  Administered 2022-06-08 – 2022-06-09 (×2): 200 mg via ORAL
  Filled 2022-06-08: qty 2
  Filled 2022-06-08: qty 4
  Filled 2022-06-08: qty 2

## 2022-06-08 MED ORDER — ORAL CARE MOUTH RINSE: 15.0000 mL | OROMUCOSAL | Status: DC | PRN

## 2022-06-08 MED ORDER — ESCITALOPRAM OXALATE 10 MG PO TABS
20.0000 mg | ORAL_TABLET | Freq: Every day | ORAL | Status: DC
  Administered 2022-06-08 – 2022-06-15 (×8): 20 mg via ORAL
  Filled 2022-06-08 (×8): qty 2

## 2022-06-08 MED ORDER — ACETAMINOPHEN 650 MG RE SUPP: 650.0000 mg | Freq: Four times a day (QID) | RECTAL | Status: DC | PRN

## 2022-06-08 MED ORDER — ASPIRIN 81 MG PO TBEC
81.0000 mg | DELAYED_RELEASE_TABLET | Freq: Every day | ORAL | Status: DC
  Administered 2022-06-08 – 2022-06-15 (×8): 81 mg via ORAL
  Filled 2022-06-08 (×8): qty 1

## 2022-06-08 MED ORDER — AMLODIPINE BESYLATE 10 MG PO TABS
10.0000 mg | ORAL_TABLET | Freq: Every day | ORAL | Status: DC
  Administered 2022-06-08 – 2022-06-11 (×4): 10 mg via ORAL
  Filled 2022-06-08: qty 1
  Filled 2022-06-08: qty 2
  Filled 2022-06-08 (×3): qty 1

## 2022-06-08 MED ORDER — MELATONIN 5 MG PO TABS: 5.0000 mg | ORAL_TABLET | Freq: Every evening | ORAL | Status: DC | PRN

## 2022-06-08 MED ORDER — IPRATROPIUM-ALBUTEROL 0.5-2.5 (3) MG/3ML IN SOLN
3.0000 mL | RESPIRATORY_TRACT | Status: DC | PRN
  Filled 2022-06-08: qty 3

## 2022-06-08 NOTE — Assessment & Plan Note (Signed)
-   We will continue Neurontin. ?

## 2022-06-08 NOTE — Progress Notes (Signed)
Anticoagulation monitoring(Lovenox):  88 yo female ordered Lovenox 40 mg Q24h    Filed Weights   06/08/22 0520  Weight: 68.2 kg (150 lb 5.7 oz)   BMI 29.5   Lab Results  Component Value Date   CREATININE 1.20 (H) 06/08/2022   CREATININE 1.04 (H) 07/01/2020   CREATININE 1.16 (H) 11/05/2019   Estimated Creatinine Clearance: 29.5 mL/min (A) (by C-G formula based on SCr of 1.2 mg/dL (H)). Hemoglobin & Hematocrit     Component Value Date/Time   HGB 9.6 (L) 06/08/2022 0307   HGB 7.2 (L) 10/09/2019 1123   HCT 30.8 (L) 06/08/2022 0307   HCT 23.6 (L) 10/09/2019 1123     Per Protocol for Patient with estCrcl < 30 ml/min and BMI < 30, will transition to Lovenox 30 mg Q24h.

## 2022-06-08 NOTE — Assessment & Plan Note (Addendum)
-   The patient will be placed on IV aztreonam and will follow urine culture and sensitivity.

## 2022-06-08 NOTE — Progress Notes (Signed)
Jimsey made aware patient has order for reds vest clip reading. New admit.

## 2022-06-08 NOTE — Assessment & Plan Note (Signed)
-   This could be associated with generalized weakness due to #1 and #2. - PT consult to be obtained to assess her ambulation. - Management otherwise as above.

## 2022-06-08 NOTE — Consult Note (Signed)
[facility] NOTE       Patient ID: Jessica Howell MRN: [id number] DOB/AGE: 25-Sep-1934 88 y.o.  Admit date: 06/08/2022 Referring Physician Dr, Eugenie Norrie Primary Physician Dr. Halina Maidens Primary Cardiologist Dr. Nehemiah Massed Reason for Consultation AoCHF  HPI: Jessica Howell is an 88yoF with a PMH of HFmrEF (45-50%, g3DD, mild MR, mod pulm HTN 04/2022), HTN, HLD, CAD, paroxysmal AF (declined AC), COPD, hx tobacco use, CKD 3, cognitive impairment who presented to [facility] ED 06/08/22 after a fall at home, was hypoxic when EMS arrived and placed her on 3 L.  BNP was elevated at 1200 concerning for acute on chronic CHF for which cardiology is consulted.  Patient is a very limited historian and is quite hard of hearing so most of the history is obtained from the chart.  It appears she was recently admitted at [facility] January 2024 for a COPD exacerbation/acute on chronic hypoxic respiratory failure with some component of CHF exacerbation it seems.  She was hospitalized from 05/04/2022 to 05/20/2022 and discharged to a skilled nursing facility.  During that admission she had difficult to control blood pressures requiring multiple agents, and was discharged on torsemide 10 mg daily.  She presented to [facility] ED via EMS after she fell at home, she was putting on her shoes and slid out of bed, hitting her right arm against the bedside table.  Per EMS she was hypoxic, requiring 3 L of oxygen.  She was given a dose of IV Lasix 20 mg x 1 in addition to her home antihypertensives, and was started on antibiotics for treatment of cystitis.  At my time of evaluation patient was resting with her eyes closed but awakens easily to voice.  Her only complaint is pain in her right shoulder and back from her fall. She says she feels somewhat short of breath she is on continuous oxygen usually but I am not sure if this is actually the case.  Denies chest pain and is unsure regarding peripheral edema.  Recent vitals  notable for elevated blood pressure at 190/50, heart rate in the 50s-60s in sinus brady to NSR on telemetry.  Requiring 3 L of oxygen by nasal cannula. Labs are notable for a potassium of 3.7, BUN/creatinine 14/1.2 and GFR 44, BNP elevated at 1200, high-sensitivity troponin minimally elevated with a flat trend at 33, 34.  H&H low at 9.6/30.8, platelets 252.  Chest x-ray and read as stable without acute findings, but does appear to have some vascular congestion.   Review of systems complete and found to be negative unless listed above     Past Medical History:  Diagnosis Date   A-fib ([place])    Allergy    Anemia    CHF (congestive heart failure) (HCC)    Chronic anticoagulation, recent, Xarelto  10/18/2019   COPD (chronic obstructive pulmonary disease) (HCC)    Depression    Feeling grief    GERD (gastroesophageal reflux disease)    Hyperlipidemia    Hypertension    Hypertensive urgency 10/18/2019   Hypokalemia 09/06/2016   Last Assessment & Plan:  Formatting of this note is different from the original. Will continue to monitor; ordered labs as below. Lab Results  Component Value Date   K 3.2 (L) 04/12/2016   K 3.5 04/10/2016   K 4.2 04/09/2016   K 4.4 11/21/2013   K 4.4 01/03/2013   Myocardial infarction [facility])    Osteoarthritis    Post-nasal drip 11/10/2015   Formatting of this  note might be different from the original. Overview:   (Working Diagnosis)  Last Assessment & Plan:  Formatting of this note might be different from the original. Continue OTC flonase 2 sprays daily Likely contributing to cough.  Will see if flonase helps with cough and PND   Right wrist pain 06/20/2015   Last Assessment & Plan:  Formatting of this note might be different from the original. Significant edema, and some grip and extension weakness, and persistent severe pain. She was not able to tolerate splint Reviewed xr of her recent ED visit No fractures identified (except old fracture on radial head)  +++ snuff box  tenderness persists [facility]erred [facility] Information provide   Upper GI bleed 10/19/2019   Vitamin D deficiency     Past Surgical History:  Procedure Laterality Date   ABDOMINAL HYSTERECTOMY     BREAST SURGERY     COLONOSCOPY WITH PROPOFOL N/A 10/21/2019   Procedure: COLONOSCOPY WITH PROPOFOL;  Surgeon: Jonathon Bellows, MD;  Location: [facility] ENDOSCOPY;  Service: Gastroenterology;  Laterality: N/A;   ESOPHAGOGASTRODUODENOSCOPY (EGD) WITH PROPOFOL N/A 10/20/2019   Procedure: ESOPHAGOGASTRODUODENOSCOPY (EGD) WITH PROPOFOL;  Surgeon: Lin Landsman, MD;  Location: [facility] ENDOSCOPY;  Service: Gastroenterology;  Laterality: N/A;   FOREARM SURGERY     GIVENS CAPSULE STUDY N/A 10/21/2019   Procedure: GIVENS CAPSULE STUDY;  Surgeon: Jonathon Bellows, MD;  Location: [facility] ENDOSCOPY;  Service: Gastroenterology;  Laterality: N/A;   TREATMENT INTERTROCHANTERIC, PERITROCHANTERIC, OR SUBTROCHANTERIC FEMUR FX; W/INTRAMED IMPLANT W/WO SCREWS Left 01/2020    (Not in a hospital admission)  Social History   Socioeconomic History   Marital status: Widowed    Spouse name: Not on file   Number of children: 1   Years of education: Not on file   Highest education level: Not on file  Occupational History   Occupation: retired  Tobacco Use   Smoking status: Former    Packs/day: 0.50    Years: 45.00    Total pack years: 22.50    Types: Cigarettes    Quit date: 06/10/2004    Years since quitting: 18.0   Smokeless tobacco: Never  Vaping Use   Vaping Use: Never used  Substance and Sexual Activity   Alcohol use: Never   Drug use: Not Currently   Sexual activity: Not Currently  Other Topics Concern   Not on file  Social History Narrative   Living alone. Husband passed away in 11-20-18. Daughter checking in on her and great grandson checks in also.    Social Determinants of Health   Financial Resource Strain: Low Risk  (12/08/2021)   Overall Financial Resource Strain (CARDIA)    Difficulty  of Paying Living Expenses: Not hard at all  Food Insecurity: No Food Insecurity (12/08/2021)   Hunger Vital Sign    Worried About Running Out of Food in the Last Year: Never true    Ran Out of Food in the Last Year: Never true  Transportation Needs: No Transportation Needs (12/08/2021)   PRAPARE - Hydrologist (Medical): No    Lack of Transportation (Non-Medical): No  Physical Activity: Inactive (09/26/2019)   Exercise Vital Sign    Days of Exercise per Week: 0 days    Minutes of Exercise per Session: 0 min  Stress: Stress Concern Present (09/26/2019)   [place]    Feeling of Stress : Very much  Social Connections: Socially Isolated (12/08/2021)  Social Licensed conveyancer [NHANES]    Frequency of Communication with Friends and Family: Never    Frequency of Social Gatherings with Friends and Family: Never    Attends Religious Services: Never    Marine scientist or Organizations: No    Attends Archivist Meetings: Never    Marital Status: Widowed  Intimate Partner Violence: Not At Risk (09/26/2019)   Humiliation, Afraid, Rape, and Kick questionnaire    Fear of Current or Ex-Partner: No    Emotionally Abused: No    Physically Abused: No    Sexually Abused: No    Family History  Problem Relation Age of Onset   Diabetes Mother    Hypertension Mother    Cancer Father        esoph.    COPD Sister    Heart disease Brother    Early death Son      No intake or output data in the 24 hours ending 06/08/22 0751  Vitals:   06/08/22 0500 06/08/22 0520 06/08/22 0600 06/08/22 0700  BP: (!) 185/54  (!) 184/99 (!) 185/63  Pulse: 62  60 (!) 59  Resp: 20  (!) 23 (!) 23  Temp:      TempSrc:      SpO2: 98%  98% 99%  Weight:  68.2 kg    Height:  5' (1.524 m)      PHYSICAL EXAM General: Elderly and frail-appearing Caucasian female, in no acute distress.  Sitting upright  in ED stretcher without family at bedside. HEENT:  Normocephalic and atraumatic. Very hard of hearing, reads lips ok.  Neck:  No JVD.  Lungs: Normal respiratory effort on 3 L by nasal cannula.  Bibasilar rhonchi.   Heart: HRRR . Normal S1 and S2 without gallops or murmurs.  Abdomen: Non-distended appearing.  Msk: Generalized weakness Extremities: Warm and well perfused. No clubbing, cyanosis.  No peripheral edema.  Neuro: Awake and alert.  Psych:  Answers simple questions but is a limited historian   Labs: Basic Metabolic Panel: Recent Labs    06/08/22 0307  NA 134*  K 3.7  CL 102  CO2 21*  GLUCOSE 99  BUN 14  CREATININE 1.20*  CALCIUM 9.3   Liver Function Tests: Recent Labs    06/08/22 0307  AST 19  ALT 12  ALKPHOS 67  BILITOT 0.7  PROT 6.4*  ALBUMIN 3.2*   No results for input(s): "LIPASE", "AMYLASE" in the last 72 hours. CBC: Recent Labs    06/08/22 0307  WBC 8.7  NEUTROABS 6.0  HGB 9.6*  HCT 30.8*  MCV 84.8  PLT 252   Cardiac Enzymes: Recent Labs    06/08/22 0307 06/08/22 0514  TROPONINIHS 33* 34*   BNP: Recent Labs    06/08/22 0307  BNP 1,226.2*   D-Dimer: No results for input(s): "DDIMER" in the last 72 hours. Hemoglobin A1C: No results for input(s): "HGBA1C" in the last 72 hours. Fasting Lipid Panel: No results for input(s): "CHOL", "HDL", "LDLCALC", "TRIG", "CHOLHDL", "LDLDIRECT" in the last 72 hours. Thyroid Function Tests: No results for input(s): "TSH", "T4TOTAL", "T3FREE", "THYROIDAB" in the last 72 hours.  Invalid input(s): "FREET3" Anemia Panel: No results for input(s): "VITAMINB12", "FOLATE", "FERRITIN", "TIBC", "IRON", "RETICCTPCT" in the last 72 hours.   Radiology: DG Thoracic Spine 2 View  Result Date: 06/08/2022 CLINICAL DATA:  Back pain following fall EXAM: THORACIC SPINE 2 VIEWS COMPARISON:  Chest radiograph 10/17/2019 FINDINGS: Stable compression deformity of T6 without associated retropulsion.  No acute fracture or  listhesis of the thoracic spine. Remaining vertebral body height is preserved. Mild endplate changes noted within the midthoracic spine in keeping with changes of mild degenerative disc disease. Paraspinal soft tissues are unremarkable. IMPRESSION: 1. No acute fracture or listhesis. 2. Stable T6 compression deformity. Electronically Signed   By: Fidela Salisbury M.D.   On: 06/08/2022 03:53   DG Chest Port 1 View  Result Date: 06/08/2022 CLINICAL DATA:  Sepsis, back pain EXAM: PORTABLE CHEST 1 VIEW COMPARISON:  10/17/2019 FINDINGS: The lungs are symmetrically well expanded. Chronic interstitial changes are seen. No confluent pulmonary infiltrate. No pneumothorax or pleural effusion. Atherosclerotic calcification noted within the aorta. Mild cardiomegaly is stable when accounting for supine positioning. Pulmonary vascularity is normal. No acute bone abnormality. IMPRESSION: 1. No active disease. 2. Stable cardiomegaly. Electronically Signed   By: Fidela Salisbury M.D.   On: 06/08/2022 03:51    ECHO 05/05/2022   1. The left ventricle is normal in size with mildly increased wall  thickness.   2. The left ventricular systolic function is mildly decreased, LVEF is  visually estimated at 45-50%.    3. There is grade III diastolic dysfunction (severely elevated filling  pressure).   4. The mitral valve leaflets are mildly thickened with normal leaflet  mobility.   5. There is mild centrally directed mitral regurgitation.    6. The left atrium is mildly dilated in size.    7. The aortic valve is trileaflet with moderately thickened leaflets with  normal excursion.    8. The right ventricle is upper normal in size, with normal systolic  function.   9. There is mild tricuspid regurgitation.    10. There is moderate pulmonary hypertension.    11. The right atrium is moderately dilated in size.    12. IVC size and inspiratory change suggest elevated right atrial pressure.  (10-20 mmHg).   Leane Call  11/2019 Indeterminate Lexiscan infusion EKG  Fixed inferior myocardial perfusion defect consistent previous infarct  and/or scar and minimal reversible lateral myocardial perfusion defect  consistent with possible myocardial ischemia.  Clinical correlation advised   TELEMETRY reviewed by me (LT) 06/08/2022 : sinus brady to NSR 54-60s  EKG reviewed by me: NSR, nonspecific TW abnormality  Data reviewed by me (LT) 06/08/2022: Discharge summary from hospitalization at [facility] 04/2022, ED note, admission H&P last 24 hours labs, imaging, telemetry vitals  Principal Problem:   Acute on chronic systolic CHF (congestive heart failure) (HCC) Active Problems:   Chronic obstructive pulmonary disease, unspecified ([place])   Hypertensive urgency   Peripheral neuropathy   Acute lower UTI   Fall at home, initial encounter   Depression   GERD without esophagitis   Acute respiratory failure with hypoxia (HCC)    ASSESSMENT AND PLAN:  Jessica Howell is an 42yoF with a PMH of HFmrEF (45-50%, g3DD, mild MR, mod pulm HTN 04/2022), HTN, HLD, CAD, paroxysmal AF (declined AC), COPD, hx tobacco use, CKD 3, cognitive impairment who presented to [facility] ED 06/08/22 after a fall at home, was hypoxic when EMS arrived and placed her on 3 L.  BNP was elevated at 1200 concerning for acute on chronic CHF for which cardiology is consulted.  # Cystitis # fall  Imaging without acute fx Management per primary team, s/p aztreonam, change to cefdinir  # Acute on chronic HFmrEF (45-50%, G3 DD 04/2022) Presents with an increased oxygen requirement, BNP of 1200, pulmonary vascular congestion on CXR. Patient is a limited historian,  unclear what lead to this decompensation. Not grossly hypervolemic on exam. Good UOP after initial diuresis.  -continue IV lasix 67m BID for now -continue GDMT with metoprolol XL 2092m lisinopril 4019maily. Restart spiro 2m81mily. No SGLT2i with ongoing cystitis. Consider isordil/hydral if BP still  up -strict I/O  # HTN Requiring multiple agents for control [facility]. Continue current regimen with the above + amlodipine 10mg50mly and clonidine 0.1mg B1m   # Moderate pulmonary hypertension # COPD On 3L, unclear baseline requirement. Continue inhalers per primary + diuresis as above  # CKD 3 Monitor closely with diuresis.  Cr 1.2, GFR 44 today  # Paroxysmal atrial fibrillation In NSR on tele, not on AC at baseline with fall history   # demand ischemia  Minimal elevation with flat trend at 33, 34. In absence of chest pain or EKG changes most consistent with demand/supply mismatch and not ACS   This patient's plan of care was discussed and created with Dr. CallwoClayborn Bignesse is in agreement.  Signed: Jahshua Bonito MTristan SchroederC 06/08/2022, 7:51 AM [facility]

## 2022-06-08 NOTE — ED Triage Notes (Signed)
Pt sts that she slid out of her bed and hit a table on the way down. Pt is requiring oxygen at this time when she doesn't normally use it. Pt is c/o mid back pain and may have hit her head but pt is not on blood thinners.

## 2022-06-08 NOTE — Progress Notes (Signed)
*  PRELIMINARY RESULTS* Echocardiogram 2D Echocardiogram has been performed.  Elpidio Anis 06/08/2022, 3:40 PM

## 2022-06-08 NOTE — ED Notes (Addendum)
Patient transported back from xray 

## 2022-06-08 NOTE — ED Provider Notes (Signed)
[facility] Provider Note    Event Date/Time   First MD Initiated Contact with Patient 06/08/22 [phone]     (approximate)   History   Back Pain and Fall   HPI  Jessica Howell is a 88 y.o. female with extensive past medical history that notably includes COPD without chronic oxygen requirement, paroxysmal A-fib, Alzheimer's disease, CHF, CKD, etc.  She presents tonight by EMS after a fall at home.  She reports that she was putting on her shoes and slid out of the bed and struck her back on a table on the way down.  She also thinks she might of hit her left elbow.  She is not certain.  Per EMS, she was hypoxic on scene and they put her on 3 L of oxygen for transport to the ED.  Upon arrival to the ED, she says that she does feel short of breath but she says "they are working on me for that".  She states that she was just released from the hospital a few days ago and says that she was in "[place]".  She said that she just wants to make sure nothing is broken from her fall.  She could not tell me for sure which arm she hit but she thinks it was her left elbow.  She denies chest pain and abdominal pain.  She has no pain in her legs.  She reports some pain in the middle of her back right in the center that happened as a result of her fall.     Physical Exam   Triage Vital Signs: ED Triage Vitals [06/08/22 0216]  Enc Vitals Group     BP (!) 189/75     Pulse Rate 66     Resp 20     Temp 98.5 F (36.9 C)     Temp Source Oral     SpO2 96 %     Weight      Height      Head Circumference      Peak Flow      Pain Score 8     Pain Loc      Pain Edu?      Excl. in [place]?     Most recent vital signs: Vitals:   06/08/22 0227 06/08/22 0230  BP:  (!) 184/63  Pulse:  66  Resp:  18  Temp:    SpO2: 96%      General: Awake, alert and conversant.  Very hard of hearing. CV:  Good peripheral perfusion.  Regular rate and rhythm.  Normal heart sounds Resp:  Normal  effort. Speaking easily and comfortably, no accessory muscle usage nor intercostal retractions.  She is on supplemental oxygen at this time but lungs are clear to auscultation. Abd:  No distention.  No tenderness to palpation. Other:  No obvious acute injury to her arms or her legs.  She has full range of motion, both active and passive, in all 4 extremities.  She rolled over without assistance to her left side and allow me to examine her back.  I see no evidence of injury but she has some tenderness to palpation in the mid thoracic region.   ED Results / Procedures / Treatments   Labs (all labs ordered are listed, but only abnormal results are displayed) Labs Reviewed  COMPREHENSIVE METABOLIC PANEL - Abnormal; Notable for the following components:      Result Value   Sodium 134 (*)    CO2  21 (*)    Creatinine, Ser 1.20 (*)    Total Protein 6.4 (*)    Albumin 3.2 (*)    GFR, Estimated 44 (*)    All other components within normal limits  CBC WITH DIFFERENTIAL/PLATELET - Abnormal; Notable for the following components:   RBC 3.63 (*)    Hemoglobin 9.6 (*)    HCT 30.8 (*)    RDW 15.7 (*)    All other components within normal limits  BRAIN NATRIURETIC PEPTIDE - Abnormal; Notable for the following components:   B Natriuretic Peptide 1,226.2 (*)    All other components within normal limits  BLOOD GAS, VENOUS - Abnormal; Notable for the following components:   pH, Ven 7.46 (*)    pCO2, Ven 36 (*)    All other components within normal limits  TROPONIN I (HIGH SENSITIVITY) - Abnormal; Notable for the following components:   Troponin I (High Sensitivity) 33 (*)    All other components within normal limits  RESP PANEL BY RT-PCR (RSV, FLU A&B, COVID)  RVPGX2  CULTURE, BLOOD (SINGLE)  LACTIC ACID, PLASMA  PROTIME-INR  LACTIC ACID, PLASMA  URINALYSIS, W/ REFLEX TO CULTURE (INFECTION SUSPECTED)  TROPONIN I (HIGH SENSITIVITY)     EKG  ED ECG REPORT I, Hinda Kehr, the attending  physician, personally viewed and interpreted this ECG.  Date: 06/08/2022 EKG Time: 2:13 AM Rate: 67 Rhythm: normal sinus rhythm QRS Axis: normal Intervals: normal ST/T Wave abnormalities: normal Narrative Interpretation: no evidence of acute ischemia    RADIOLOGY I viewed and interpreted the patient's chest x-ray.  No evidence of pneumonia or pulmonary edema.  I also read the radiologist's report, which confirmed no acute findings.    PROCEDURES:  Critical Care performed: Yes, see critical care procedure note(s)  .Critical Care  Performed by: Hinda Kehr, MD Authorized by: Hinda Kehr, MD   Critical care provider statement:    Critical care time (minutes):  30   Critical care time was exclusive of:  Separately billable procedures and treating other patients   Critical care was necessary to treat or prevent imminent or life-threatening deterioration of the following conditions:  Respiratory failure   Critical care was time spent personally by me on the following activities:  Development of treatment plan with patient or surrogate, evaluation of patient's response to treatment, examination of patient, obtaining history from patient or surrogate, ordering and performing treatments and interventions, ordering and review of laboratory studies, ordering and review of radiographic studies, pulse oximetry, re-evaluation of patient's condition and review of old charts .1-3 Lead EKG Interpretation  Performed by: Hinda Kehr, MD Authorized by: Hinda Kehr, MD     Interpretation: normal     ECG rate:  66   ECG rate assessment: normal     Rhythm: sinus rhythm     Ectopy: none     Conduction: normal      MEDICATIONS ORDERED IN ED: Medications  furosemide (LASIX) injection 20 mg (has no administration in time range)     IMPRESSION / MDM / ASSESSMENT AND PLAN / ED COURSE  I reviewed the triage vital signs and the nursing notes.                              Differential  diagnosis includes, but is not limited to, fracture, dislocation, metabolic or electrolyte abnormality, COPD exacerbation, CHF exacerbation, ACS, PE, pneumothorax.  Patient's presentation is most consistent with acute presentation with  potential threat to life or bodily function.  Labs/studies ordered: High-sensitivity troponin, blood culture, BNP, CBC with differential, lactic acid, respiratory viral panel, CMP, pro time-INR, VBG, urinalysis with reflex culture, 1 view chest x-ray, 2 view thoracic spine x-rays. Interventions/Medications given: Supplemental oxygen [facility] Course my include additional interventions not listed in this section:)  History is limited and the patient has a history of Alzheimer's disease and seems to be confabulating when asked about the history.  I looked through the medical records including Care Everywhere and requesting outside records and there is no indication she has been recently hospitalized, although of course it is possible she was at a facility that does not use epic.  I have verified from a primary care doctor note that she has a history of CHF and COPD but does not seem to use supplemental oxygen.  We trialed her off of oxygen in the ED and she almost immediately dropped down to 86%, though her lungs are clear and she is not wheezing.  We will continue on supplemental oxygen and she is not in substantial distress but I will do a broad workup and try to identify any site of acute injury or emergent medical condition.  The patient is on the cardiac monitor to evaluate for evidence of arrhythmia and/or significant heart rate changes.   Clinical Course as of 06/08/22 0418  Tue Jun 08, 2022  0356 B Natriuretic Peptide(!): 1,226.2 [CF]  0357 Blood gas, venous(!) Generally reassuring VBG with no hypercapnia. [CF]  [id number] CBC with Differential(!) Anemia but not sufficient to require transfusion.  No leukocytosis [CF]  0357 Comprehensive metabolic  panel(!) Creatinine is 1.2 but it has been similarly elevated in the past. [CF]  0357 Troponin I (High Sensitivity)(!) Slightly elevated troponin, but her troponin seems to always be elevated and is actually less elevated than it has been in the past. [CF]  0400 Lab work is notable for a substantially elevated BNP at 1226, considerably higher than it has been in the past.  Rest of the lab work is generally reassuring.  Given the patient's apparently new oxygen requirement and her resulting diagnosis of acute respiratory failure with hypoxia, she needs to be admitted to the hospital for further evaluation.  Given that this is most likely due to a CHF exacerbation even though she does not have frank pulmonary edema on chest x-ray, I ordered furosemide 20 mg IV (she reportedly takes torsemide 10 mg daily by mouth).  I am consulting the hospitalist for admission. [CF]  [id number] Consulted Dr. Sidney Ace with the hospitalist service.  We discussed the case by phone and he will admit the patient. [CF]    Clinical Course User Index [CF] Hinda Kehr, MD     FINAL CLINICAL IMPRESSION(S) / ED DIAGNOSES   Final diagnoses:  Acute respiratory failure with hypoxia ([place])  Acute on chronic congestive heart failure, unspecified heart failure type [facility])     Rx / DC Orders   ED Discharge Orders     None        Note:  This document was prepared using Dragon voice recognition software and may include unintentional dictation errors.   Hinda Kehr, MD 06/08/22 [phone]

## 2022-06-08 NOTE — Discharge Instructions (Signed)

## 2022-06-08 NOTE — ED Notes (Signed)
Patient transported to X-ray 

## 2022-06-08 NOTE — Assessment & Plan Note (Addendum)
-   We will place the patient on as needed DuoNebs. - We will continue Pulmicort.

## 2022-06-08 NOTE — Assessment & Plan Note (Addendum)
-   We will continue Toprol-XL and aspirin. - The patient is obviously a fall risk for anticoagulation.

## 2022-06-08 NOTE — Assessment & Plan Note (Addendum)
-  The patient will be admitted to a cardiac telemetry bed. - The patient has subsequent acute respiratory failure with hypoxia. - We will continue diuresis with IV Lasix. - We Will follow serial troponins. - We will follow I's and O's and daily weights. - Cardiology consult be obtained. - I notified Dr. Clayborn Bigness about the patient.

## 2022-06-08 NOTE — ED Notes (Signed)
Patient transported to CT 

## 2022-06-08 NOTE — H&P (Addendum)
[place]   PATIENT NAME: Jessica Howell    MR#:  [id number]  DATE OF BIRTH:  Dec 24, 1934  DATE OF ADMISSION:  06/08/2022  PRIMARY CARE PHYSICIAN: Glean Hess, MD   Patient is coming from: Home  REQUESTING/REFERRING PHYSICIAN: Hinda Kehr, MD  CHIEF COMPLAINT:   Chief Complaint  Patient presents with   Back Pain   Fall    HISTORY OF PRESENT ILLNESS:  Jessica Howell is a 88 y.o. Caucasian female with medical history significant for systolic CHF, COPD, depression, GERD, hypertension, dyslipidemia, osteoarthritis and atrial fibrillation, who presented to the emergency room with acute onset of a fall at home.  The patient was putting on her shoes and slid out of her bed striking her back on a table on the way down.  She may have hit her left elbow.  She was noted to be hypoxic by EMS and she was placed on 3 L of O2 by nasal cannula on arrival to the hospital.  She was having dyspnea when she came to the ER and Pulse oximetry was 86% on room air and 95 to 98% on 3 L of O2 by nasal cannula.  No headache or dizziness or blurred vision.  No report paresthesias or focal muscle weakness.  No chest pain or palpitations.  No nausea or vomiting or abdominal pain.  She was having pain in the middle of the back after her fall.  He admitted to dysuria without urinary frequency or urgency, oliguria or hematuria or flank pain.  ED Course: When she came to the ER, BP was 184/63 with pulse oximetry as above.  Labs revealed sodium of 134 and CO2 of 21 with a creatinine 1.2 and albumin 3.2 total protein 6.4.  BNP was 1226.2 and high sensitive troponin was 33.  Lactic acid was 0.7 and CBC showed anemia with hemoglobin of 9.6 and hematocrit 30.8 compared to 13.8 and 40.1 however in January 2022.  Influenza COVID-19 and RSV PCR's came back negative.  UA showed 11-20 WBCs with rare bacteria, 100 protein positive nitrite and moderate leukocytes. EKG as reviewed by me : Right ventricular hypertrophy  with normal sinus rhythm with a rate of 67 Imaging: 2 view thoracic spine x-ray revealed stable T6 compression deformity with no acute fracture or listhesis. Portable chest x-ray showed cardiomegaly with no acute cardiopulmonary disease.  The patient was given 20 mg of IV Lasix.  She will be admitted to a cardiac telemetry bed for further evaluation and management. PAST MEDICAL HISTORY:   Past Medical History:  Diagnosis Date   A-fib [facility])    Allergy    Anemia    CHF (congestive heart failure) (HCC)    Chronic anticoagulation, recent, Xarelto  10/18/2019   COPD (chronic obstructive pulmonary disease) (HCC)    Depression    Feeling grief    GERD (gastroesophageal reflux disease)    Hyperlipidemia    Hypertension    Hypertensive urgency 10/18/2019   Hypokalemia 09/06/2016   Last Assessment & Plan:  Formatting of this note is different from the original. Will continue to monitor; ordered labs as below. Lab Results  Component Value Date   K 3.2 (L) 04/12/2016   K 3.5 04/10/2016   K 4.2 04/09/2016   K 4.4 11/21/2013   K 4.4 01/03/2013   Myocardial infarction [facility])    Osteoarthritis    Post-nasal drip 11/10/2015   Formatting of this note might be different from the original. Overview:   (Working  Diagnosis)  Last Assessment & Plan:  Formatting of this note might be different from the original. Continue OTC flonase 2 sprays daily Likely contributing to cough.  Will see if flonase helps with cough and PND   Right wrist pain 06/20/2015   Last Assessment & Plan:  Formatting of this note might be different from the original. Significant edema, and some grip and extension weakness, and persistent severe pain. She was not able to tolerate splint Reviewed xr of her recent ED visit No fractures identified (except old fracture on radial head)  +++ snuff box tenderness persists [facility]erred to [facility] Information provide   Upper GI bleed 10/19/2019   Vitamin D deficiency     PAST SURGICAL  HISTORY:   Past Surgical History:  Procedure Laterality Date   ABDOMINAL HYSTERECTOMY     BREAST SURGERY     COLONOSCOPY WITH PROPOFOL N/A 10/21/2019   Procedure: COLONOSCOPY WITH PROPOFOL;  Surgeon: Jonathon Bellows, MD;  Location: [facility] ENDOSCOPY;  Service: Gastroenterology;  Laterality: N/A;   ESOPHAGOGASTRODUODENOSCOPY (EGD) WITH PROPOFOL N/A 10/20/2019   Procedure: ESOPHAGOGASTRODUODENOSCOPY (EGD) WITH PROPOFOL;  Surgeon: Lin Landsman, MD;  Location: [facility] ENDOSCOPY;  Service: Gastroenterology;  Laterality: N/A;   FOREARM SURGERY     GIVENS CAPSULE STUDY N/A 10/21/2019   Procedure: GIVENS CAPSULE STUDY;  Surgeon: Jonathon Bellows, MD;  Location: [facility] ENDOSCOPY;  Service: Gastroenterology;  Laterality: N/A;   TREATMENT INTERTROCHANTERIC, PERITROCHANTERIC, OR SUBTROCHANTERIC FEMUR FX; W/INTRAMED IMPLANT W/WO SCREWS Left 01/2020    SOCIAL HISTORY:   Social History   Tobacco Use   Smoking status: Former    Packs/day: 0.50    Years: 45.00    Total pack years: 22.50    Types: Cigarettes    Quit date: 06/10/2004    Years since quitting: 18.0   Smokeless tobacco: Never  Substance Use Topics   Alcohol use: Never    FAMILY HISTORY:   Family History  Problem Relation Age of Onset   Diabetes Mother    Hypertension Mother    Cancer Father        esoph.    COPD Sister    Heart disease Brother    Early death Son     DRUG ALLERGIES:   Allergies  Allergen Reactions   Penicillins Anaphylaxis, Rash and Other (See Comments)   Azithromycin Other (See Comments)    abd pain    Gemfibrozil Nausea And Vomiting and Rash        Solifenacin     Mild urinary retention   Statins Nausea And Vomiting and Rash    Stomach pain      REVIEW OF SYSTEMS:   ROS As per history of present illness. All pertinent systems were reviewed above. Constitutional, HEENT, cardiovascular, respiratory, GI, GU, musculoskeletal, neuro, psychiatric, endocrine, integumentary and hematologic systems were  reviewed and are otherwise negative/unremarkable except for positive findings mentioned above in the HPI.   MEDICATIONS AT HOME:   Prior to Admission medications   Medication Sig Start Date End Date Taking? Authorizing Provider  acetaminophen (TYLENOL) 325 MG tablet Take 325 mg by mouth every 4 (four) hours as needed.    [provider]  albuterol (PROVENTIL) (2.5 MG/3ML) 0.083% nebulizer solution INHALE THE CONTENTS OF 1 VIAL VIA NEBULIZER EVERY 6 HOURS AS NEEDED FOR SHORTNESS OF BREATH OR FOR WHEEZING 05/31/22   Glean Hess, MD  albuterol (VENTOLIN HFA) 108 (90 Base) MCG/ACT inhaler Inhale 2 puffs into the lungs every 6 (six) hours  as needed. 02/13/20   Glean Hess, MD  amLODipine (NORVASC) 10 MG tablet TAKE 1 TABLET EVERY DAY 05/10/22   Glean Hess, MD  aspirin EC 81 MG tablet Take 81 mg by mouth daily. Swallow whole.    [provider]  budesonide (PULMICORT) 0.5 MG/2ML nebulizer solution Take 2 mLs (0.5 mg total) by nebulization 2 (two) times daily. 11/11/21   Glean Hess, MD  CALCIUM PO Take 600 mg by mouth 2 (two) times daily.    [provider]  cloNIDine (CATAPRES) 0.1 MG tablet TAKE 1 TABLET TWICE DAILY 04/09/22   Glean Hess, MD  Cyanocobalamin (VITAMIN B 12 PO) Take 1,000 mcg by mouth daily.    [provider]  dicyclomine (BENTYL) 10 MG capsule Take 1 capsule (10 mg total) by mouth in the morning, at noon, in the evening, and at bedtime. 01/31/22   Glean Hess, MD  escitalopram (LEXAPRO) 20 MG tablet TAKE 1 TABLET EVERY DAY 05/10/22   Glean Hess, MD  ferrous gluconate [facility]) 324 MG tablet TAKE 1 TABLET EVERY DAY 01/28/21   Glean Hess, MD  fluticasone [facility]) 50 MCG/ACT nasal spray Place 2 sprays into both nostrils daily.    [provider]  gabapentin (NEURONTIN) 100 MG capsule TAKE 1 CAPSULE TWICE DAILY 04/05/22   Glean Hess, MD  lisinopril (ZESTRIL) 40 MG tablet TAKE 1 TABLET EVERY  DAY 05/10/22   Glean Hess, MD  Melatonin 5 MG CHEW Chew by mouth.     [provider]  metoprolol (TOPROL-XL) 200 MG 24 hr tablet TAKE 1 TABLET EVERY DAY 04/28/22   Glean Hess, MD  Misc Natural Products (FIBER 7 PO) Take by mouth.    [provider]  montelukast (SINGULAIR) 10 MG tablet TAKE 1 TABLET AT BEDTIME 05/10/22   Glean Hess, MD  omeprazole (PRILOSEC) 20 MG capsule TAKE 1 CAPSULE TWICE DAILY BEFORE MEALS 12/15/21   Glean Hess, MD  torsemide (DEMADEX) 10 MG tablet Take 1 tablet (10 mg total) by mouth daily. 04/06/22   Glean Hess, MD  vitamin C (ASCORBIC ACID) 500 MG tablet Take 500 mg by mouth daily.    [provider]      VITAL SIGNS:  Blood pressure (!) 185/54, pulse 62, temperature 98.5 F (36.9 C), temperature source Oral, resp. rate 20, height 5' (1.524 m), weight 68.2 kg, SpO2 98 %.  PHYSICAL EXAMINATION:  Physical Exam  GENERAL:  88 y.o.-year-old patient lying in the bed with no acute distress.  EYES: Pupils equal, round, reactive to light and accommodation. No scleral icterus. Extraocular muscles intact.  HEENT: Head atraumatic, normocephalic. Oropharynx and nasopharynx clear.  NECK:  Supple, no jugular venous distention. No thyroid enlargement, no tenderness.  LUNGS: Diminished bibasal breath sounds with bibasal rales.. No use of accessory muscles of respiration.  CARDIOVASCULAR: Regular rate and rhythm, S1, S2 normal. No murmurs, rubs, or gallops.  ABDOMEN: Soft, nondistended, nontender. Bowel sounds present. No organomegaly or mass.  EXTREMITIES: Trace to 1+ bilateral lower extremity pitting edema, with no cyanosis, or clubbing.  NEUROLOGIC: Cranial nerves II through XII are intact. Muscle strength 5/5 in all extremities. Sensation intact. Gait not checked.  PSYCHIATRIC: The patient is alert and oriented x 3.  Normal affect and good eye contact. SKIN: No obvious rash, lesion, or ulcer.   LABORATORY PANEL:    CBC Recent Labs  Lab 06/08/22 0307  WBC 8.7  HGB 9.6*  HCT  30.8*  PLT 252   ------------------------------------------------------------------------------------------------------------------  Chemistries  Recent Labs  Lab 06/08/22 0307  NA 134*  K 3.7  CL 102  CO2 21*  GLUCOSE 99  BUN 14  CREATININE 1.20*  CALCIUM 9.3  AST 19  ALT 12  ALKPHOS 67  BILITOT 0.7   ------------------------------------------------------------------------------------------------------------------  Cardiac Enzymes No results for input(s): "TROPONINI" in the last 168 hours. ------------------------------------------------------------------------------------------------------------------  RADIOLOGY:  DG Thoracic Spine 2 View  Result Date: 06/08/2022 CLINICAL DATA:  Back pain following fall EXAM: THORACIC SPINE 2 VIEWS COMPARISON:  Chest radiograph 10/17/2019 FINDINGS: Stable compression deformity of T6 without associated retropulsion. No acute fracture or listhesis of the thoracic spine. Remaining vertebral body height is preserved. Mild endplate changes noted within the midthoracic spine in keeping with changes of mild degenerative disc disease. Paraspinal soft tissues are unremarkable. IMPRESSION: 1. No acute fracture or listhesis. 2. Stable T6 compression deformity. Electronically Signed   By: Fidela Salisbury M.D.   On: 06/08/2022 03:53   DG Chest Port 1 View  Result Date: 06/08/2022 CLINICAL DATA:  Sepsis, back pain EXAM: PORTABLE CHEST 1 VIEW COMPARISON:  10/17/2019 FINDINGS: The lungs are symmetrically well expanded. Chronic interstitial changes are seen. No confluent pulmonary infiltrate. No pneumothorax or pleural effusion. Atherosclerotic calcification noted within the aorta. Mild cardiomegaly is stable when accounting for supine positioning. Pulmonary vascularity is normal. No acute bone abnormality. IMPRESSION: 1. No active disease. 2. Stable cardiomegaly. Electronically Signed   By:  Fidela Salisbury M.D.   On: 06/08/2022 03:51      IMPRESSION AND PLAN:  Assessment and Plan: * Acute on chronic systolic CHF (congestive heart failure) ([place])  -The patient will be admitted to a cardiac telemetry bed. - The patient has subsequent acute respiratory failure with hypoxia. - We will continue diuresis with IV Lasix. - We Will follow serial troponins. - We will follow I's and O's and daily weights. - Cardiology consult be obtained. - I notified Dr. Clayborn Bigness about the patient.   Acute lower UTI - The patient will be placed on IV aztreonam and will follow urine culture and sensitivity.  Fall at home, initial encounter - This could be associated with generalized weakness due to #1 and #2. - PT consult to be obtained to assess her ambulation. - Management otherwise as above.  Hypertensive urgency - The patient will be placed on as needed IV hydralazine. - We will resume her antihypertensives.  GERD without esophagitis - We will continue PPI therapy.  Depression - We will continue PPI therapy.  Peripheral neuropathy - We will continue Neurontin.  Chronic obstructive pulmonary disease, unspecified ([place]) - We will place the patient on as needed DuoNebs. - We will continue Pulmicort.  Paroxysmal atrial fibrillation (HCC) - We will continue Toprol-XL and aspirin. - The patient is obviously a fall risk for anticoagulation.   DVT prophylaxis: Lovenox.  Advanced Care Planning:  Code Status: She is DNR/DNI. Family Communication:  The plan of care was discussed in details with the patient (and family). I answered all questions. The patient agreed to proceed with the above mentioned plan. Further management will depend upon hospital course. Disposition Plan: Back to previous home environment Consults called: none.  All the records are reviewed and case discussed with ED provider.  Status is: Inpatient   At the time of the admission, it appears that the appropriate  admission status for this patient is inpatient.  This is judged to be reasonable and necessary in order to provide the  required intensity of service to ensure the patient's safety given the presenting symptoms, physical exam findings and initial radiographic and laboratory data in the context of comorbid conditions.  The patient requires inpatient status due to high intensity of service, high risk of further deterioration and high frequency of surveillance required.  I certify that at the time of admission, it is my clinical judgment that the patient will require inpatient hospital care extending more than 2 midnights.                            Dispo: The patient is from: Home              Anticipated d/c is to: Home              Patient currently is not medically stable to d/c.              Difficult to place patient: No  Christel Mormon M.D on 06/08/2022 at 6:17 AM  [facility]   From 7 PM-7 AM, contact night-coverage [email or web address]  CC: Primary care physician; Glean Hess, MD

## 2022-06-08 NOTE — Evaluation (Signed)
Occupational Therapy Evaluation Patient Details Name: Jessica Howell MRN: [id number] DOB: [birth year] Today's Date: 06/08/2022   History of Present Illness Jessica Howell is a 88 y.o. Caucasian female with medical history significant for systolic CHF, COPD, depression, GERD, hypertension, dyslipidemia, osteoarthritis and atrial fibrillation, who presented to the emergency room with acute onset of a fall at home.  The patient was putting on her shoes and slid out of her bed striking her back on a table on the way down.  She may have hit her left elbow.   Clinical Impression   Patient presenting with decreased independence in self care, balance, functional mobility/transfers, endurance, and safety awareness. Pt A&Ox3 (disoriented to date, which is baseline). Pt likely poor historian 2/2 cognitive deficits. OT spoke with daughter on the phone (pt gave the OK) who reported pt recently moved into [facility]F 1-2 days ago, which is where she had the fall. Daughter reports PTA pt was Mod I for ADLs and functional mobility using a rollator. Pt currently functioning at CGA-Min A for bed mobility, Min A for seated LB dressing, Min guard to stand from stretcher, and Min A via HHA to take lateral steps at EOB. Anticipate pt can discharge back to ALF with HHOT. Pt will benefit from acute OT to increase overall independence in the areas of ADLs and functional mobility in order to safely discharge home. Pt could benefit from [facility] following D/C to decrease falls risk, improve balance, and maximize independence in self-care within own home environment.    Recommendations for follow up therapy are one component of a multi-disciplinary discharge planning process, led by the attending physician.  Recommendations may be updated based on patient status, additional functional criteria and insurance authorization.   Follow Up Recommendations  Home health OT     Assistance Recommended at Discharge Frequent or constant  Supervision/Assistance  Patient can return home with the following A little help with walking and/or transfers;A little help with bathing/dressing/bathroom;Assistance with cooking/housework;Direct supervision/assist for financial management;Direct supervision/assist for medications management;Assist for transportation;Help with stairs or ramp for entrance    Functional Status Assessment  Patient has had a recent decline in their functional status and demonstrates the ability to make significant improvements in function in a reasonable and predictable amount of time.  Equipment Recommendations  None recommended by OT    Recommendations for Other Services       Precautions / Restrictions Precautions Precautions: Fall Restrictions Weight Bearing Restrictions: No      Mobility Bed Mobility Overal bed mobility: Needs Assistance Bed Mobility: Supine to Sit, Sit to Supine     Supine to sit: HOB elevated, Min guard Sit to supine: Min assist (to manage BLEs 2/2 height of stretcher)        Transfers Overall transfer level: Needs assistance Equipment used: 1 person hand held assist Transfers: Sit to/from Stand Sit to Stand: Min guard                  Balance Overall balance assessment: Needs assistance Sitting-balance support: Bilateral upper extremity supported Sitting balance-Leahy Scale: Fair Sitting balance - Comments: unable to touch floor with B feet 2/2 height of stretcher   Standing balance support: Single extremity supported, During functional activity Standing balance-Leahy Scale: Fair                             ADL either performed or assessed with clinical judgement   ADL Overall ADL's :  Needs assistance/impaired     Grooming: Min guard;Standing Grooming Details (indicate cue type and reason): anticipate         Upper Body Dressing : Set up;Sitting   Lower Body Dressing: Sitting/lateral leans;Min guard;Minimal assistance Lower Body  Dressing Details (indicate cue type and reason): Min guard for dynamic sitting balance 2/2 height of stretcher, demonstrated figure 4 technique             Functional mobility during ADLs: Minimal assistance (lateral steps at EOB with Min A via HHA)       Vision Patient Visual Report: No change from baseline       Perception     Praxis      Pertinent Vitals/Pain Pain Assessment Pain Assessment: Faces Faces Pain Scale: Hurts a little bit Pain Location: L elbow Pain Descriptors / Indicators: Sore Pain Intervention(s): Limited activity within patient's tolerance, Monitored during session, Repositioned, Other (comment) (messaged attending since chart mentions pt may have hit L elbow during fall - not planning to do any imaging per MD)     Hand Dominance     Extremity/Trunk Assessment Upper Extremity Assessment Upper Extremity Assessment: Generalized weakness;LUE deficits/detail LUE: Unable to fully assess due to pain   Lower Extremity Assessment Lower Extremity Assessment: Defer to PT evaluation       Communication Communication Communication: Deaf (can read lips)   Cognition Arousal/Alertness: Awake/alert Behavior During Therapy: WFL for tasks assessed/performed, Flat affect Overall Cognitive Status: History of cognitive impairments - at baseline                                 General Comments: Pt oriented to self, "hospital", and that she had a fall. Daughter reports pt does not always know date at baseline     General Comments       Exercises Other Exercises Other Exercises: OT provided education re: role of OT, OT POC, post acute recs, sitting up for all meals, EOB/OOB mobility with assistance, home/fall safety    Shoulder Instructions      Home Living Family/patient expects to be discharged to:: Assisted living [facility])                             Home Equipment: Rollator (4 wheels);Cane - single point   Additional  Comments: Pt reported living with her daugher, however, after speaking with daughter on the phone found out that pt moved into ALF 1-2 days ago where she had the fall.      Prior Functioning/Environment Prior Level of Function : Needs assist;History of Falls (last six months)             Mobility Comments: Daughter reports pt is Mod I with rollator, 1 recent fall ADLs Comments: Mod I for ADLs (pt/daughter report she can dress herself). Facility provides assistance for IADLs (meals, medication management).        OT Problem List: Decreased strength;Decreased range of motion;Pain;Cardiopulmonary status limiting activity;Decreased activity tolerance;Impaired balance (sitting and/or standing);Decreased cognition;Decreased safety awareness;Decreased knowledge of use of DME or AE;Decreased knowledge of precautions      OT Treatment/Interventions: Self-care/ADL training;Therapeutic exercise;Therapeutic activities;Cognitive remediation/compensation;Energy conservation;DME and/or AE instruction;Patient/family education;Balance training    OT Goals(Current goals can be found in the care plan section) Acute Rehab OT Goals Patient Stated Goal: none stated OT Goal Formulation: With patient/family Time For Goal Achievement: 06/22/22 Potential to Achieve  Goals: Good   OT Frequency: Min 2X/week    Co-evaluation              AM-PAC OT "6 Clicks" Daily Activity     Outcome Measure Help from another person eating meals?: None Help from another person taking care of personal grooming?: A Little Help from another person toileting, which includes using toliet, bedpan, or urinal?: A Little Help from another person bathing (including washing, rinsing, drying)?: A Lot Help from another person to put on and taking off regular upper body clothing?: A Little Help from another person to put on and taking off regular lower body clothing?: A Little 6 Click Score: 18   End of Session Equipment  Utilized During Treatment: Gait belt Nurse Communication: Mobility status  Activity Tolerance: Patient tolerated treatment well Patient left: in bed;with call bell/phone within reach  OT Visit Diagnosis: Unsteadiness on feet (R26.81);Other symptoms and signs involving cognitive function;Muscle weakness (generalized) (M62.81);Pain Pain - Right/Left: Left Pain - part of body: Arm                Time: [id number] OT Time Calculation (min): 20 min Charges:  OT General Charges $OT Visit: 1 Visit OT Evaluation $OT Eval Low Complexity: 1 Low  [facility] MS, OTR/L ascom [phone]  06/08/22, 3:32 PM

## 2022-06-08 NOTE — Progress Notes (Signed)
Pharmacy Antibiotic Note  Jessica Howell is a 88 y.o. female admitted on 06/08/2022 with UTI.  Pharmacy has been consulted for Aztreonam dosing.  Plan: Aztreonam 2 gm IV Q8H ordered to start on 2/13 @ 0700.   Height: 5' (152.4 cm) Weight: 68.2 kg (150 lb 5.7 oz) IBW/kg (Calculated) : 45.5  Temp (24hrs), Avg:98.5 F (36.9 C), Min:98.5 F (36.9 C), Max:98.5 F (36.9 C)  Recent Labs  Lab 06/08/22 0307  WBC 8.7  CREATININE 1.20*  LATICACIDVEN 0.7    Estimated Creatinine Clearance: 29.5 mL/min (A) (by C-G formula based on SCr of 1.2 mg/dL (H)).    Allergies  Allergen Reactions   Penicillins Anaphylaxis, Rash and Other (See Comments)   Azithromycin Other (See Comments)    abd pain    Gemfibrozil Nausea And Vomiting and Rash        Solifenacin     Mild urinary retention   Statins Nausea And Vomiting and Rash    Stomach pain      Antimicrobials this admission:   >>    >>   Dose adjustments this admission:   Microbiology results:  BCx:   UCx:    Sputum:    MRSA PCR:   Thank you for allowing pharmacy to be a part of this patient's care.  Lebron Nauert D 06/08/2022 6:29 AM

## 2022-06-08 NOTE — Progress Notes (Signed)
Pharmacy Antibiotic Note  Jessica Howell is a 88 y.o. female admitted on 06/08/2022 with UTI.  Pharmacy has been consulted for Aztreonam dosing.  Plan: --Aztreonam will be discontinued --Start Cefdinir 300 mg po daily x 5 days --Patient previously tolerated ceftriaxone and cefdinir at [facility] facility in January 2024  Height: 5' (152.4 cm) Weight: 68.2 kg (150 lb 5.7 oz) IBW/kg (Calculated) : 45.5  Temp (24hrs), Avg:98.5 F (36.9 C), Min:98.5 F (36.9 C), Max:98.5 F (36.9 C)  Recent Labs  Lab 06/08/22 0307  WBC 8.7  CREATININE 1.20*  LATICACIDVEN 0.7     Estimated Creatinine Clearance: 29.5 mL/min (A) (by C-G formula based on SCr of 1.2 mg/dL (H)).    Allergies  Allergen Reactions   Penicillins Anaphylaxis, Rash and Other (See Comments)    Tolerated 5 day course of cefdinir at [facility] 04/2022   Azithromycin Other (See Comments)    abd pain    Gemfibrozil Nausea And Vomiting and Rash        Solifenacin     Mild urinary retention   Statins Nausea And Vomiting and Rash    Stomach pain      Antimicrobials this admission:   Aztreonam 2/13>> 2/13   Cefdinir 2/13>> (5 days)  Dose adjustments this admission: Aztreonam d/c 06/08/22  Microbiology results:  BCx: pending  UCx:  pending  Thank you for allowing pharmacy to be a part of this patient's care.  Lorin Picket, PharmD 06/08/2022 11:55 AM

## 2022-06-08 NOTE — Assessment & Plan Note (Signed)
-   The patient will be placed on as needed IV hydralazine. - We will resume her antihypertensives.

## 2022-06-08 NOTE — Assessment & Plan Note (Signed)
-   We will continue PPI therapy. 

## 2022-06-08 NOTE — Progress Notes (Addendum)
Patient seen in the ER examined. Chart reviewed. Admitted early hours of the morning from [facility] assisted living after patient sustained a fall and complained of weakness with shortness of breath. She was found to be in congestive heart failure and had abnormal UA. Patient initially complained with dysuria and was started on IV antibiotic. She is also getting IV Lasix for diuresis. Seen by [facility] cardiology. Patient able to move her left arm well with me and with occupational therapy. Continue to monitor. If complains of ongoing pain consider imaging of left arm/elbow given fall at assisted living.  PT OT to see patient. TOC for discharge planning.  Spoke with patient's daughter Hilda Blades on the phone. Updated.  No charge

## 2022-06-09 DIAGNOSIS — I5023 Acute on chronic systolic (congestive) heart failure: Secondary | ICD-10-CM | POA: Diagnosis not present

## 2022-06-09 LAB — ECHOCARDIOGRAM COMPLETE
AR max vel: 2.18 cm2
AV Area VTI: 2.24 cm2
AV Area mean vel: 2.22 cm2
AV Mean grad: 6 mmHg
AV Peak grad: 13.4 mmHg
Ao pk vel: 1.83 m/s
Area-P 1/2: 4.6 cm2
Height: 60 in
MV VTI: 2.43 cm2
S' Lateral: 2.9 cm
Weight: 2405.66 oz

## 2022-06-09 LAB — BASIC METABOLIC PANEL
Anion gap: 7 (ref 5–15)
BUN: 12 mg/dL (ref 8–23)
CO2: 25 mmol/L (ref 22–32)
Calcium: 9.4 mg/dL (ref 8.9–10.3)
Chloride: 105 mmol/L (ref 98–111)
Creatinine, Ser: 1.04 mg/dL — ABNORMAL HIGH (ref 0.44–1.00)
GFR, Estimated: 53 mL/min — ABNORMAL LOW (ref >60)
Glucose, Bld: 99 mg/dL (ref 70–99)
Potassium: 3.2 mmol/L — ABNORMAL LOW (ref 3.5–5.1)
Sodium: 137 mmol/L (ref 135–145)

## 2022-06-09 MED ORDER — HYDRALAZINE HCL 10 MG PO TABS
10.0000 mg | ORAL_TABLET | Freq: Three times a day (TID) | ORAL | Status: DC
  Administered 2022-06-09 – 2022-06-10 (×2): 10 mg via ORAL
  Filled 2022-06-09 (×2): qty 1

## 2022-06-09 MED ORDER — POTASSIUM CHLORIDE CRYS ER 20 MEQ PO TBCR
40.0000 meq | EXTENDED_RELEASE_TABLET | Freq: Once | ORAL | Status: AC
  Administered 2022-06-09: 40 meq via ORAL
  Filled 2022-06-09: qty 2

## 2022-06-09 MED ORDER — CEFDINIR 300 MG PO CAPS
300.0000 mg | ORAL_CAPSULE | Freq: Two times a day (BID) | ORAL | Status: DC
  Administered 2022-06-09: 300 mg via ORAL
  Filled 2022-06-09 (×2): qty 1

## 2022-06-09 MED ORDER — HYDRALAZINE HCL 25 MG PO TABS
25.0000 mg | ORAL_TABLET | Freq: Three times a day (TID) | ORAL | Status: DC
  Administered 2022-06-09: 25 mg via ORAL
  Filled 2022-06-09 (×2): qty 1

## 2022-06-09 NOTE — Progress Notes (Signed)
[place] NOTE       Patient ID: Jessica Howell MRN: [id number] DOB/AGE: 06-08-1934 88 y.o.  Admit date: 06/08/2022 Referring Physician Dr, Eugenie Norrie Primary Physician Dr. Halina Maidens Primary Cardiologist Dr. Nehemiah Massed Reason for Consultation AoCHF  HPI: Jessica Howell is an 88yoF with a PMH of HFmrEF (45-50%, g3DD, mild MR, mod pulm HTN 04/2022), HTN, HLD, CAD, paroxysmal AF (declined AC), COPD, hx tobacco use, CKD 3, cognitive impairment who presented to [facility] ED 06/08/22 after a fall at home, was hypoxic when EMS arrived and placed her on 3 L.  BNP was elevated at 1200 concerning for acute on chronic CHF for which cardiology is consulted.  Interval History: -No acute events -Main complaint is the sun shining in her eyes from the window.  Feels "a little" short of breath but denies chest pain or other complaints -Oriented to self and "cone" but limited historian otherwise -Brisk diuresis yesterday -BP remains elevated despite multiple antihypertensives   Review of systems complete and found to be negative unless listed above     Past Medical History:  Diagnosis Date   A-fib ([place])    Allergy    Anemia    CHF (congestive heart failure) (HCC)    Chronic anticoagulation, recent, Xarelto  10/18/2019   COPD (chronic obstructive pulmonary disease) ([place])    Depression    Feeling grief    GERD (gastroesophageal reflux disease)    Hyperlipidemia    Hypertension    Hypertensive urgency 10/18/2019   Hypokalemia 09/06/2016   Last Assessment & Plan:  Formatting of this note is different from the original. Will continue to monitor; ordered labs as below. Lab Results  Component Value Date   K 3.2 (L) 04/12/2016   K 3.5 04/10/2016   K 4.2 04/09/2016   K 4.4 11/21/2013   K 4.4 01/03/2013   Myocardial infarction [facility])    Osteoarthritis    Post-nasal drip 11/10/2015   Formatting of this note might be different from the original. Overview:   (Working Diagnosis)  Last  Assessment & Plan:  Formatting of this note might be different from the original. Continue OTC flonase 2 sprays daily Likely contributing to cough.  Will see if flonase helps with cough and PND   Right wrist pain 06/20/2015   Last Assessment & Plan:  Formatting of this note might be different from the original. Significant edema, and some grip and extension weakness, and persistent severe pain. She was not able to tolerate splint Reviewed xr of her recent ED visit No fractures identified (except old fracture on radial head)  +++ snuff box tenderness persists [facility]erred to [facility] Information provide   Upper GI bleed 10/19/2019   Vitamin D deficiency     Past Surgical History:  Procedure Laterality Date   ABDOMINAL HYSTERECTOMY     BREAST SURGERY     COLONOSCOPY WITH PROPOFOL N/A 10/21/2019   Procedure: COLONOSCOPY WITH PROPOFOL;  Surgeon: Jonathon Bellows, MD;  Location: [facility] ENDOSCOPY;  Service: Gastroenterology;  Laterality: N/A;   ESOPHAGOGASTRODUODENOSCOPY (EGD) WITH PROPOFOL N/A 10/20/2019   Procedure: ESOPHAGOGASTRODUODENOSCOPY (EGD) WITH PROPOFOL;  Surgeon: Lin Landsman, MD;  Location: [facility] ENDOSCOPY;  Service: Gastroenterology;  Laterality: N/A;   FOREARM SURGERY     GIVENS CAPSULE STUDY N/A 10/21/2019   Procedure: GIVENS CAPSULE STUDY;  Surgeon: Jonathon Bellows, MD;  Location: [facility] ENDOSCOPY;  Service: Gastroenterology;  Laterality: N/A;   TREATMENT INTERTROCHANTERIC, PERITROCHANTERIC, OR SUBTROCHANTERIC FEMUR FX; W/INTRAMED IMPLANT W/WO SCREWS  Left 01/2020    Medications Prior to Admission  Medication Sig Dispense Refill Last Dose   acetaminophen (TYLENOL) 325 MG tablet Take 325 mg by mouth every 6 (six) hours as needed for mild pain, moderate pain, fever or headache.   unk   albuterol (PROVENTIL) (2.5 MG/3ML) 0.083% nebulizer solution INHALE THE CONTENTS OF 1 VIAL VIA NEBULIZER EVERY 6 HOURS AS NEEDED FOR SHORTNESS OF BREATH OR FOR WHEEZING 540 mL 0 unk   albuterol  (VENTOLIN HFA) 108 (90 Base) MCG/ACT inhaler Inhale 2 puffs into the lungs every 6 (six) hours as needed. 54 g 1 unk   amLODipine (NORVASC) 10 MG tablet TAKE 1 TABLET EVERY DAY 90 tablet 3 06/07/2022 at 0800   budesonide (PULMICORT) 0.25 MG/2ML nebulizer solution Take 0.25 mg by nebulization daily.   06/07/2022 at 0800   cloNIDine (CATAPRES) 0.1 MG tablet TAKE 1 TABLET TWICE DAILY (Patient taking differently: Take 0.1 mg by mouth 3 (three) times daily.) 180 tablet 0 06/07/2022 at 2000   dicyclomine (BENTYL) 10 MG capsule Take 1 capsule (10 mg total) by mouth in the morning, at noon, in the evening, and at bedtime. 360 capsule 1 06/07/2022 at 2000   escitalopram (LEXAPRO) 20 MG tablet TAKE 1 TABLET EVERY DAY 90 tablet 3 06/07/2022 at 0800   gabapentin (NEURONTIN) 100 MG capsule TAKE 1 CAPSULE TWICE DAILY 180 capsule 3 06/07/2022 at 2000   hydrALAZINE (APRESOLINE) 25 MG tablet Take 25 mg by mouth every 8 (eight) hours.   06/07/2022 at 2200   ipratropium-albuterol (DUONEB) 0.5-2.5 (3) MG/3ML SOLN Take 3 mLs by nebulization every 6 (six) hours.   06/08/2022 at 0000   lisinopril (ZESTRIL) 40 MG tablet TAKE 1 TABLET EVERY DAY 90 tablet 3 06/07/2022 at 0800   Melatonin 5 MG CHEW Chew by mouth.    unk   metoprolol (TOPROL-XL) 200 MG 24 hr tablet TAKE 1 TABLET EVERY DAY 90 tablet 3 06/07/2022 at 0800   montelukast (SINGULAIR) 10 MG tablet TAKE 1 TABLET AT BEDTIME 90 tablet 3 06/07/2022 at 2000   nitroGLYCERIN (NITROSTAT) 0.4 MG SL tablet Place 0.4 mg under the tongue every 5 (five) minutes as needed for chest pain.   unk   omeprazole (PRILOSEC) 20 MG capsule TAKE 1 CAPSULE TWICE DAILY BEFORE MEALS 180 capsule 3 06/07/2022 at 2000   spironolactone (ALDACTONE) 25 MG tablet Take 25 mg by mouth daily.   06/07/2022 at 0800   torsemide (DEMADEX) 10 MG tablet Take 1 tablet (10 mg total) by mouth daily. 90 tablet 0 06/07/2022 at 0800   aspirin EC 81 MG tablet Take 81 mg by mouth daily. Swallow whole. (Patient not taking: Reported  on 06/08/2022)   Not Taking   budesonide (PULMICORT) 0.5 MG/2ML nebulizer solution Take 2 mLs (0.5 mg total) by nebulization 2 (two) times daily. (Patient not taking: Reported on 06/08/2022) 360 mL 1 Not Taking   CALCIUM PO Take 600 mg by mouth 2 (two) times daily. (Patient not taking: Reported on 06/08/2022)   Not Taking   Cyanocobalamin (VITAMIN B 12 PO) Take 1,000 mcg by mouth daily. (Patient not taking: Reported on 06/08/2022)   Not Taking   ferrous gluconate (FERGON) 324 MG tablet TAKE 1 TABLET EVERY DAY (Patient not taking: Reported on 06/08/2022) 90 tablet 1 Not Taking   fluticasone (FLONASE) 50 MCG/ACT nasal spray Place 2 sprays into both nostrils daily. (Patient not taking: Reported on 06/08/2022)   Not Taking   Misc Natural Products (FIBER 7 PO) Take by  mouth. (Patient not taking: Reported on 06/08/2022)   Not Taking   vitamin C (ASCORBIC ACID) 500 MG tablet Take 500 mg by mouth daily. (Patient not taking: Reported on 06/08/2022)   Not Taking    Social History   Socioeconomic History   Marital status: Widowed    Spouse name: Not on file   Number of children: 1   Years of education: Not on file   Highest education level: Not on file  Occupational History   Occupation: retired  Tobacco Use   Smoking status: Former    Packs/day: 0.50    Years: 45.00    Total pack years: 22.50    Types: Cigarettes    Quit date: 06/10/2004    Years since quitting: 18.0   Smokeless tobacco: Never  Vaping Use   Vaping Use: Never used  Substance and Sexual Activity   Alcohol use: Never   Drug use: Not Currently   Sexual activity: Not Currently  Other Topics Concern   Not on file  Social History Narrative   Living alone. Husband passed away in 2018/11/24. Daughter checking in on her and great grandson checks in also.    Social Determinants of Health   Financial Resource Strain: Low Risk  (12/08/2021)   Overall Financial Resource Strain (CARDIA)    Difficulty of Paying Living Expenses: Not hard at  all  Food Insecurity: No Food Insecurity (06/08/2022)   Hunger Vital Sign    Worried About Running Out of Food in the Last Year: Never true    Ran Out of Food in the Last Year: Never true  Transportation Needs: No Transportation Needs (06/08/2022)   PRAPARE - Hydrologist (Medical): No    Lack of Transportation (Non-Medical): No  Physical Activity: Inactive (09/26/2019)   Exercise Vital Sign    Days of Exercise per Week: 0 days    Minutes of Exercise per Session: 0 min  Stress: Stress Concern Present (09/26/2019)   [place]    Feeling of Stress : Very much  Social Connections: Socially Isolated (12/08/2021)   Social Connection and Isolation Panel [NHANES]    Frequency of Communication with Friends and Family: Never    Frequency of Social Gatherings with Friends and Family: Never    Attends Religious Services: Never    Marine scientist or Organizations: No    Attends Archivist Meetings: Never    Marital Status: Widowed  Intimate Partner Violence: Not At Risk (06/08/2022)   Humiliation, Afraid, Rape, and Kick questionnaire    Fear of Current or Ex-Partner: No    Emotionally Abused: No    Physically Abused: No    Sexually Abused: No    Family History  Problem Relation Age of Onset   Diabetes Mother    Hypertension Mother    Cancer Father        esoph.    COPD Sister    Heart disease Brother    Early death Son       Intake/Output Summary (Last 24 hours) at 06/09/2022 0823 Last data filed at 06/09/2022 0000 Gross per 24 hour  Intake 100 ml  Output 3450 ml  Net -3350 ml    Vitals:   06/08/22 2210 06/09/22 0143 06/09/22 0333 06/09/22 0816  BP: (!) 191/60 (!) 188/71 (!) 184/62 (!) 190/81  Pulse: 64 (!) 59 69 69  Resp: 20 20 20 16  $ Temp: 99.1 F (37.3  C) 98.5 F (36.9 C) 98.6 F (37 C) 98.5 F (36.9 C)  TempSrc: Oral  Oral   SpO2: 98% 96% 98% (!) 87%  Weight:       Height:        PHYSICAL EXAM General: Elderly and frail-appearing Caucasian female, in no acute distress.  Sitting upright in PCU bed. HEENT:  Normocephalic and atraumatic. Very hard of hearing, reads lips ok.  Neck:  No JVD.  Lungs: Normal respiratory effort on 3 L by nasal cannula.  Bibasilar rhonchi.   Heart: HRRR . Normal S1 and S2 without gallops or murmurs.  Abdomen: Non-distended appearing.  Msk: Generalized weakness Extremities: Warm and well perfused. No clubbing, cyanosis.  No peripheral edema.  Neuro: Awake and alert.  Psych:  Answers simple questions but is a limited historian   Labs: Basic Metabolic Panel: Recent Labs    06/08/22 0307 06/09/22 0416  NA 134* 137  K 3.7 3.2*  CL 102 105  CO2 21* 25  GLUCOSE 99 99  BUN 14 12  CREATININE 1.20* 1.04*  CALCIUM 9.3 9.4    Liver Function Tests: Recent Labs    06/08/22 0307  AST 19  ALT 12  ALKPHOS 67  BILITOT 0.7  PROT 6.4*  ALBUMIN 3.2*    No results for input(s): "LIPASE", "AMYLASE" in the last 72 hours. CBC: Recent Labs    06/08/22 0307 06/08/22 1156  WBC 8.7 8.1  NEUTROABS 6.0 5.5  HGB 9.6* 10.4*  HCT 30.8* 33.4*  MCV 84.8 84.6  PLT 252 PLATELET CLUMPS NOTED ON SMEAR, UNABLE TO ESTIMATE    Cardiac Enzymes: Recent Labs    06/08/22 0307 06/08/22 0514  TROPONINIHS 33* 34*    BNP: Recent Labs    06/08/22 0307  BNP 1,226.2*    D-Dimer: No results for input(s): "DDIMER" in the last 72 hours. Hemoglobin A1C: No results for input(s): "HGBA1C" in the last 72 hours. Fasting Lipid Panel: No results for input(s): "CHOL", "HDL", "LDLCALC", "TRIG", "CHOLHDL", "LDLDIRECT" in the last 72 hours. Thyroid Function Tests: No results for input(s): "TSH", "T4TOTAL", "T3FREE", "THYROIDAB" in the last 72 hours.  Invalid input(s): "FREET3" Anemia Panel: No results for input(s): "VITAMINB12", "FOLATE", "FERRITIN", "TIBC", "IRON", "RETICCTPCT" in the last 72 hours.   Radiology: DG Thoracic Spine 2  View  Result Date: 06/08/2022 CLINICAL DATA:  Back pain following fall EXAM: THORACIC SPINE 2 VIEWS COMPARISON:  Chest radiograph 10/17/2019 FINDINGS: Stable compression deformity of T6 without associated retropulsion. No acute fracture or listhesis of the thoracic spine. Remaining vertebral body height is preserved. Mild endplate changes noted within the midthoracic spine in keeping with changes of mild degenerative disc disease. Paraspinal soft tissues are unremarkable. IMPRESSION: 1. No acute fracture or listhesis. 2. Stable T6 compression deformity. Electronically Signed   By: Fidela Salisbury M.D.   On: 06/08/2022 03:53   DG Chest Port 1 View  Result Date: 06/08/2022 CLINICAL DATA:  Sepsis, back pain EXAM: PORTABLE CHEST 1 VIEW COMPARISON:  10/17/2019 FINDINGS: The lungs are symmetrically well expanded. Chronic interstitial changes are seen. No confluent pulmonary infiltrate. No pneumothorax or pleural effusion. Atherosclerotic calcification noted within the aorta. Mild cardiomegaly is stable when accounting for supine positioning. Pulmonary vascularity is normal. No acute bone abnormality. IMPRESSION: 1. No active disease. 2. Stable cardiomegaly. Electronically Signed   By: Fidela Salisbury M.D.   On: 06/08/2022 03:51    ECHO 05/05/2022   1. The left ventricle is normal in size with mildly increased wall  thickness.  2. The left ventricular systolic function is mildly decreased, LVEF is  visually estimated at 45-50%.    3. There is grade III diastolic dysfunction (severely elevated filling  pressure).   4. The mitral valve leaflets are mildly thickened with normal leaflet  mobility.   5. There is mild centrally directed mitral regurgitation.    6. The left atrium is mildly dilated in size.    7. The aortic valve is trileaflet with moderately thickened leaflets with  normal excursion.    8. The right ventricle is upper normal in size, with normal systolic  function.   9. There is mild  tricuspid regurgitation.    10. There is moderate pulmonary hypertension.    11. The right atrium is moderately dilated in size.    12. IVC size and inspiratory change suggest elevated right atrial pressure.  (10-20 mmHg).   Leane Call 11/2019 Indeterminate Lexiscan infusion EKG  Fixed inferior myocardial perfusion defect consistent previous infarct  and/or scar and minimal reversible lateral myocardial perfusion defect  consistent with possible myocardial ischemia.  Clinical correlation advised   TELEMETRY reviewed by me (LT) 06/09/2022 : sinus brady to NSR 54-60s  EKG reviewed by me: NSR, nonspecific TW abnormality  Data reviewed by me (LT) 06/09/2022: Hospitalist progress note last 24 hours labs, imaging, telemetry vitals I's/O  Principal Problem:   Acute on chronic systolic CHF (congestive heart failure) (HCC) Active Problems:   Chronic obstructive pulmonary disease, unspecified ([place])   Hypertensive urgency   Peripheral neuropathy   Acute lower UTI   Fall at home, initial encounter   Depression   GERD without esophagitis   Acute respiratory failure with hypoxia (HCC)    ASSESSMENT AND PLAN:  Jessica Howell is an 64yoF with a PMH of HFmrEF (45-50%, g3DD, mild MR, mod pulm HTN 04/2022), HTN, HLD, CAD, paroxysmal AF (declined AC), COPD, hx tobacco use, CKD 3, cognitive impairment who presented to [facility] ED 06/08/22 after a fall at home, was hypoxic when EMS arrived and placed her on 3 L.  BNP was elevated at 1200 concerning for acute on chronic CHF for which cardiology is consulted.  # Cystitis # fall  Imaging without acute fx Management per primary team, s/p aztreonam, change to cefdinir  # Acute on chronic HFmrEF (45-50%, G3 DD 04/2022) Presents with an increased oxygen requirement, BNP of 1200, pulmonary vascular congestion on CXR. Patient is a limited historian, unclear what lead to this decompensation. Not grossly hypervolemic on exam. Good UOP after initial diuresis.   -continue IV lasix 39m BID for now -continue GDMT with metoprolol XL 2077m lisinopril 4066maily. Restart spiro 62m58mily. No SGLT2i with ongoing cystitis.  -Start hydralazine 25 mg 3 times daily -strict I/O  # HTN [facility]qu[facility] Continue current regimen with the above + amlodipine 10mg23mly and clonidine 0.1mg B29m   # Moderate pulmonary hypertension # COPD On 3L, unclear baseline requirement. Continue inhalers per primary + diuresis as above  # CKD 3 Slight improvement with diuresis  # Paroxysmal atrial fibrillation In NSR on tele, not on AC at baseline with fall history   # demand ischemia  Minimal elevation with flat trend at 33, 34. In absence of chest pain or EKG changes most consistent with demand/supply mismatch and not ACS   This patient's plan of care was discussed and created with Dr. CallwoClayborn Bignesse is in agreement.  Signed: Sameeha Rockefeller MTristan SchroederC 06/09/2022, 8:23 AM [facility]  Cardiology

## 2022-06-09 NOTE — Progress Notes (Signed)
PHARMACY NOTE:  ANTIMICROBIAL RENAL DOSAGE ADJUSTMENT  Current antimicrobial regimen includes a mismatch between antimicrobial dosage and estimated renal function.  As per policy approved by the Pharmacy & Therapeutics and Medical Executive Committees, the antimicrobial dosage will be adjusted accordingly.  Current antimicrobial dosage: Cefdinir 300 mg daily  Indication: UTI  Renal Function:  Estimated Creatinine Clearance: 32.2 mL/min (A) (by C-G formula based on SCr of 1.04 mg/dL (H)).    Antimicrobial dosage has been changed to:  Cefdinir 300 mg BID  Thank you for allowing pharmacy to be a part of this patient's care.  Benita Gutter, [facility] 06/09/2022 2:47 PM

## 2022-06-09 NOTE — Evaluation (Addendum)
Physical Therapy Evaluation Patient Details Name: Jessica Howell MRN: [id number] DOB: 1935-01-04 Today's Date: 06/09/2022  History of Present Illness  Patient is a 88 year old female presenting from ALF after sustaining a fall with weakness and shortness of breath. Found to be in congestive heart failure and had abnormal UA.    Clinical Impression  Patient is agreeable to PT. She reports she has been at the ALF for only 1-2 days before coming in the hospital. She reports ambulating with a cane at baseline but also having a rolling walker. She does not use oxygen at baseline.   Today, the patient has mild dyspnea with exertion and is fatigued after hallway ambulation. Encouraged patient to the the rolling walker at this time instead of the cane for safety and fall prevention. Educated patient on breathing techniques as well. Anticipate the patient can return back to ALF at discharge. PT will continue to follow while in the hospital to maximize independence and facilitate return to prior level of function.   SaO2 on room air at rest = 87% SaO2 on room air while ambulating = 85% SaO2 on 2 liters of O2 while ambulating = 92%      Recommendations for follow up therapy are one component of a multi-disciplinary discharge planning process, led by the attending physician.  Recommendations may be updated based on patient status, additional functional criteria and insurance authorization.  Follow Up Recommendations Home health PT      Assistance Recommended at Discharge Set up Supervision/Assistance  Patient can return home with the following  Help with stairs or ramp for entrance;Assist for transportation;Assistance with cooking/housework    Equipment Recommendations None recommended by PT  Recommendations for Other Services       Functional Status Assessment Patient has had a recent decline in their functional status and demonstrates the ability to make significant improvements in function in  a reasonable and predictable amount of time.     Precautions / Restrictions Precautions Precautions: Fall Restrictions Weight Bearing Restrictions: No      Mobility  Bed Mobility Overal bed mobility: Needs Assistance Bed Mobility: Supine to Sit, Sit to Supine     Supine to sit: Supervision Sit to supine: Supervision   General bed mobility comments: no physical assistance required. increased time and effort required    Transfers Overall transfer level: Needs assistance Equipment used: Rolling walker (2 wheels) Transfers: Sit to/from Stand Sit to Stand: Supervision           General transfer comment: no physical assistance required with standing    Ambulation/Gait Ambulation/Gait assistance: Min guard, Supervision Gait Distance (Feet): 120 Feet Assistive device: Rolling walker (2 wheels) Gait Pattern/deviations: Step-through pattern Gait velocity: decreased     General Gait Details: patient ambulated in hallway with rolling walker with steady gait using rolling walker. activity tolerance limited by fatigue. encouraged patient to use the walker instead of her cane at this time for safety. Sp02 continuously monitored throughout session, 85% on room air, 92% on 2 L02  Stairs            Wheelchair Mobility    Modified Rankin (Stroke Patients Only)       Balance Overall balance assessment: Needs assistance Sitting-balance support: Feet supported Sitting balance-Leahy Scale: Good     Standing balance support: Bilateral upper extremity supported, Reliant on assistive device for balance Standing balance-Leahy Scale: Fair  Pertinent Vitals/Pain Pain Assessment Pain Assessment: No/denies pain    Home Living Family/patient expects to be discharged to:: Assisted living                 Home Equipment: Rollator (4 wheels);Cane - single point Additional Comments: Patient reports being at ALF only a day or  two. Prior to that, patient was living with family    Prior Function Prior Level of Function : Needs assist;History of Falls (last six months)             Mobility Comments: Mod I with rollator, only one fall reported recently ADLs Comments: Mod I     Hand Dominance        Extremity/Trunk Assessment   Upper Extremity Assessment Upper Extremity Assessment: Generalized weakness    Lower Extremity Assessment Lower Extremity Assessment: Generalized weakness       Communication   Communication: HOH  Cognition Arousal/Alertness: Awake/alert Behavior During Therapy: WFL for tasks assessed/performed, Flat affect Overall Cognitive Status: History of cognitive impairments - at baseline                                 General Comments: patient is able to follow all commands without difficulty. she is oriented to person, situation        General Comments General comments (skin integrity, edema, etc.): encouraged pursed lip breathing techniques with mild shortness of breath with activity    Exercises     Assessment/Plan    PT Assessment Patient needs continued PT services  PT Problem List Cardiopulmonary status limiting activity;Decreased balance;Decreased mobility;Decreased strength;Decreased activity tolerance       PT Treatment Interventions DME instruction;Gait training;Stair training;Functional mobility training;Therapeutic activities;Therapeutic exercise;Balance training;Neuromuscular re-education;Cognitive remediation;Patient/family education    PT Goals (Current goals can be found in the Care Plan section)  Acute Rehab PT Goals Patient Stated Goal: to be discharged PT Goal Formulation: With patient Time For Goal Achievement: 06/23/22 Potential to Achieve Goals: Good    Frequency Min 2X/week     Co-evaluation               AM-PAC PT "6 Clicks" Mobility  Outcome Measure Help needed turning from your back to your side while in a flat  bed without using bedrails?: None Help needed moving from lying on your back to sitting on the side of a flat bed without using bedrails?: A Little Help needed moving to and from a bed to a chair (including a wheelchair)?: A Little Help needed standing up from a chair using your arms (e.g., wheelchair or bedside chair)?: A Little Help needed to walk in hospital room?: A Little Help needed climbing 3-5 steps with a railing? : A Little 6 Click Score: 19    End of Session Equipment Utilized During Treatment: Oxygen Activity Tolerance: Patient tolerated treatment well Patient left: in bed;with call bell/phone within reach;with bed alarm set Nurse Communication: Mobility status PT Visit Diagnosis: Unsteadiness on feet (R26.81);Muscle weakness (generalized) (M62.81)    Time: [id number] PT Time Calculation (min) (ACUTE ONLY): 21 min   Charges:   PT Evaluation $PT Eval Low Complexity: 1 Low PT Treatments $Therapeutic Activity: 8-22 mins        Minna Merritts, PT, MPT   Percell Locus 06/09/2022, 10:20 AM

## 2022-06-09 NOTE — Progress Notes (Signed)
ReDS Vest / Clip - 06/09/22 [id number]       ReDS Vest / Clip   Station Marker A    Ruler Value 29    ReDS Value Range Low volume    ReDS Actual Value 34

## 2022-06-09 NOTE — Consult Note (Signed)
   Heart Failure Nurse Navigator Note  HFmrEF echocardiogram performed in the past.  Echocardiogram results from this admission are still pending.  She presented to the emergency room with complaints of back pain and dyspnea after a fall.  Chest x-ray revealed cardiomegaly.  BNP was 1226.  Comorbidities:  COPD GERD Hypertension Hyperlipidemia Osteoarthritis Atrial fibrillation Anemia  Medications:  Amlodipine 10 mg Aspirin 81 mg daily Catapres 0.1 mg 2 times a day Due to furosemide 20 mg 2 times a day Her hydralazine 25 mg every 8 hours Lisinopril 40 mg daily Metoprolol succinate 200 mg daily Potassium chloride 40 mEq Spironolactone 25 mg daily  Labs:  Sodium 137, potassium 3.2, chloride 105, CO2 25, BUN 12, creatinine 1.04, estimated GFR 53 Weight is 60.4 kg Intake 100 mL Output 3450 mL  Initial meeting with patient for heart failure teaching.  There were no family members present at the bedside.  Patient was awake and alert oriented, she knows that she is moved from her daughters and now living in a skilled facility.  Discussed heart failure and what it means. She states that the care facility that they will weigh her if she asked them to weigh her.  Discussed salt and sodium.  Patient states for a long time she has not used salt on her food and she states the only sodium she gets is what is already contained in the foods.  Discussed her fluid restriction patient felt that she did not drink any more than 4 to 5 cups of any fluid daily.  Discussed reporting 2 pound weight gain overnight or total of 5 pounds in the week along with changes in her symptoms such as increasing shortness of breath, lower extremity edema and etc.  She voices understanding She was given the living with heart failure teaching booklet, zone magnet, info on heart failure and low-sodium along with weight chart.  The only question she had was the results of her x-ray of her back.  I explained  that those results would have to come from her physician and she voices understanding.  He has follow-up in the outpatient heart failure clinic on February 20 at 3 PM.  She is got an 8% no-show ratio which is 2 out of [place]

## 2022-06-09 NOTE — Progress Notes (Signed)
  PROGRESS NOTE    Jessica Howell  [id number] DOB: 05-16-1934 DOA: 06/08/2022 PCP: Glean Hess, MD  [id number]-AA  LOS: 1 day   Brief hospital course:   Assessment & Plan: Jessica Howell is a 88 y.o. Caucasian female with medical history significant for systolic CHF, COPD, depression, GERD, hypertension, dyslipidemia, osteoarthritis and atrial fibrillation, who presented to the emergency room with acute onset of a fall at home.   She was having dyspnea when she came to the ER and Pulse oximetry was 86% on room air and 95 to 98% on 3 L of O2 by nasal cannula.    * Acute on chronic systolic CHF (congestive heart failure) ([place]) # Moderate pulmonary hypertension   -last Echo in Jan 2024 with LVEF 45-50% --cardio consulted --cont IV lasix 20 BID --cont Toprol, Lisinopril, aldactone --f/u Echo  acute respiratory failure with hypoxia --presumed due to CHF, however, CXR didn't show obvious fluid. --currently on 3L --cont IV diuresis --Continue supplemental O2 to keep sats >=90%, wean as tolerated  Acute lower UTI --started on IV aztreonam and de-escalated to cefdinir --cont cefdinir pending urine cx results  Fall at home, initial encounter --PT/OT  Hypertensive urgency - cont amlodipine, clonidine, hydralazine, lisinopril, Toprol and aldactone  GERD without esophagitis - continue PPI therapy.  Depression - cont Lexapro  COPD - continue Pulmicort.  Paroxysmal atrial fibrillation (HCC) - The patient is obviously a fall risk for anticoagulation. --cont Toprol  # Demand ischemia --trop 33, 34, flat  CKD3a   DVT prophylaxis: Lovenox SQ Code Status: Full code  Family Communication:  Level of care: Telemetry Cardiac Dispo:   The patient is from: home Anticipated d/c is to: home Anticipated d/c date is: 1-2 days   Subjective and Interval History:  Pt reported some lower abdominal pain, hard stool.  No dysuria.  Little dyspnea.  Walked  today.   Objective: Vitals:   06/09/22 0333 06/09/22 0816 06/09/22 0833 06/09/22 1219  BP: (!) 184/62 (!) 190/81  (!) 106/58  Pulse: 69 69  (!) 58  Resp: '20 16  16  '$ Temp: 98.6 F (37 C) 98.5 F (36.9 C)  99.1 F (37.3 C)  TempSrc: Oral     SpO2: 98% (!) 87%  98%  Weight:   60.4 kg   Height:        Intake/Output Summary (Last 24 hours) at 06/09/2022 1336 Last data filed at 06/09/2022 1221 Gross per 24 hour  Intake --  Output 2450 ml  Net -2450 ml   Filed Weights   06/08/22 0520 06/08/22 1644 06/09/22 0833  Weight: 68.2 kg 61.5 kg 60.4 kg    Examination:   Constitutional: NAD, AAOx3 HEENT: conjunctivae and lids normal, EOMI CV: No cyanosis.   RESP: normal respiratory effort, on 3L Neuro: II - XII grossly intact.   Psych: Normal mood and affect.     Data Reviewed: I have personally reviewed labs and imaging studies  Time spent: 50 minutes  Enzo Bi, MD [facility] If 7PM-7AM, please contact night-coverage 06/09/2022, 1:36 PM

## 2022-06-10 DIAGNOSIS — I5023 Acute on chronic systolic (congestive) heart failure: Secondary | ICD-10-CM | POA: Diagnosis not present

## 2022-06-10 LAB — URINE CULTURE: Culture: >=100000 — AB

## 2022-06-10 LAB — BASIC METABOLIC PANEL
Anion gap: 8 (ref 5–15)
BUN: 13 mg/dL (ref 8–23)
CO2: 25 mmol/L (ref 22–32)
Calcium: 9.4 mg/dL (ref 8.9–10.3)
Chloride: 103 mmol/L (ref 98–111)
Creatinine, Ser: 1.05 mg/dL — ABNORMAL HIGH (ref 0.44–1.00)
GFR, Estimated: 52 mL/min — ABNORMAL LOW (ref >60)
Glucose, Bld: 98 mg/dL (ref 70–99)
Potassium: 3.6 mmol/L (ref 3.5–5.1)
Sodium: 136 mmol/L (ref 135–145)

## 2022-06-10 LAB — MAGNESIUM: Magnesium: 2 mg/dL (ref 1.7–2.4)

## 2022-06-10 LAB — CBC
HCT: 34.4 % — ABNORMAL LOW (ref 36.0–46.0)
Hemoglobin: 10.9 g/dL — ABNORMAL LOW (ref 12.0–15.0)
MCH: 26.6 pg (ref 26.0–34.0)
MCHC: 31.7 g/dL (ref 30.0–36.0)
MCV: 83.9 fL (ref 80.0–100.0)
Platelets: 193 10*3/uL (ref 150–400)
RBC: 4.1 MIL/uL (ref 3.87–5.11)
RDW: 15.3 % (ref 11.5–15.5)
WBC: 8.5 10*3/uL (ref 4.0–10.5)
nRBC: 0 % (ref 0.0–0.2)

## 2022-06-10 MED ORDER — LOSARTAN POTASSIUM 50 MG PO TABS
50.0000 mg | ORAL_TABLET | Freq: Every day | ORAL | Status: DC
  Administered 2022-06-10 – 2022-06-11 (×2): 50 mg via ORAL
  Filled 2022-06-10 (×3): qty 1

## 2022-06-10 MED ORDER — METOPROLOL SUCCINATE ER 100 MG PO TB24
100.0000 mg | ORAL_TABLET | Freq: Every day | ORAL | Status: DC
  Administered 2022-06-10 – 2022-06-15 (×6): 100 mg via ORAL
  Filled 2022-06-10 (×7): qty 1

## 2022-06-10 MED ORDER — FUROSEMIDE 10 MG/ML IJ SOLN
40.0000 mg | Freq: Once | INTRAMUSCULAR | Status: AC
  Administered 2022-06-10: 40 mg via INTRAVENOUS
  Filled 2022-06-10: qty 4

## 2022-06-10 MED ORDER — TORSEMIDE 20 MG PO TABS: 20.0000 mg | ORAL_TABLET | Freq: Every day | ORAL | Status: DC

## 2022-06-10 MED ORDER — HYDRALAZINE HCL 25 MG PO TABS
25.0000 mg | ORAL_TABLET | Freq: Three times a day (TID) | ORAL | Status: DC
  Administered 2022-06-10 – 2022-06-11 (×3): 25 mg via ORAL
  Filled 2022-06-10 (×3): qty 1

## 2022-06-10 MED ORDER — FOSFOMYCIN TROMETHAMINE 3 G PO PACK
3.0000 g | PACK | Freq: Once | ORAL | Status: AC
  Administered 2022-06-10: 3 g via ORAL
  Filled 2022-06-10: qty 3

## 2022-06-10 NOTE — TOC Initial Note (Signed)
Transition of Care [facility]) - Initial/Assessment Note    Patient Details  Name: Jessica Howell MRN: [id number] Date of Birth: Apr 13, 1935  Transition of Care [facility]) CM/SW Contact:    Tiburcio Bash, LCSW Phone Number: 06/10/2022, 10:59 AM  Clinical Narrative:                  CSW spoke with patient's daughter Hilda Blades regarding dc planning, she reports they recently moved patient into [facility] and is agreeable to [facility] being set up there based on [employer] preference. Reports no dme she states patient has O2, nebulizer and all dme at [facility].   CSW spoke with Graylin Shiver at [facility]  She reports they use [facility] for PT and OT, patient will need new fl2 at dc for Assisted Living. Will need O2 order for continuous or PRN O2 needs at dc.   Please fax dc summary, [place] orders and O2 orders [phone]  H&P faxed today.   Expected Discharge Plan:  [facility]) Barriers to Discharge: Continued Medical Work up   Patient Goals and CMS Choice Patient states their goals for this hospitalization and ongoing recovery are:: to go home CMS Medicare.gov Compare Post Acute Care list provided to:: Patient Represenative (must comment) (daughter) Choice offered to / list presented to : Adult Children      Expected Discharge Plan and Services       Living arrangements for the past 2 months:  [facility])                                      Prior Living Arrangements/Services Living arrangements for the past 2 months:  ([employer]) Lives with:: Facility Resident                   Activities of Daily Living Home Assistive Devices/Equipment: Environmental consultant (specify type) ADL Screening (condition at time of admission) Patient's cognitive ability adequate to safely complete daily activities?: Yes Is the patient deaf or have difficulty hearing?: Yes Does the patient have difficulty seeing, even when wearing glasses/contacts?: No Does the patient have difficulty  concentrating, remembering, or making decisions?: No Patient able to express need for assistance with ADLs?: Yes Does the patient have difficulty dressing or bathing?: No Independently performs ADLs?: No Communication: Independent Dressing (OT): Needs assistance Is this a change from baseline?: Pre-admission baseline Grooming: Needs assistance Is this a change from baseline?: Pre-admission baseline Feeding: Independent Bathing: Needs assistance Is this a change from baseline?: Pre-admission baseline Toileting: Needs assistance Is this a change from baseline?: Pre-admission baseline In/Out Bed: Needs assistance Is this a change from baseline?: Pre-admission baseline Walks in Home: Independent with device (comment) Does the patient have difficulty walking or climbing stairs?: Yes Weakness of Legs: Both Weakness of Arms/Hands: None  Permission Sought/Granted                  Emotional Assessment              Admission diagnosis:  Acute respiratory failure with hypoxia (HCC) [J96.01] Acute on chronic systolic CHF (congestive heart failure) ([place]) [I50.23] Acute on chronic congestive heart failure, unspecified heart failure type [facility]) [I50.9] Patient Active Problem List   Diagnosis Date Noted   Acute on chronic systolic CHF (congestive heart failure) ([place]) 06/08/2022   Acute lower UTI 06/08/2022   Fall at home, initial encounter 06/08/2022   Depression 06/08/2022  GERD without esophagitis 06/08/2022   Acute respiratory failure with hypoxia (HCC) 06/08/2022   Closed bilateral fracture of pubic rami (HCC) 10/14/2021   Sick sinus syndrome ([place]) 07/16/2020   Alzheimer disease ([place]) 02/22/2020   Closed fracture of left hip ([place]) 02/14/2020   Peripheral neuropathy 11/05/2019   Aortic atherosclerosis ([place]) 11/05/2019   Chronic diastolic CHF (congestive heart failure) ([place]) 10/18/2019   Hypertensive urgency 10/18/2019   Hyponatremia 10/18/2019   Pulmonary nodule 1 cm or  greater in diameter 10/18/2019   Thyroid nodule greater than or equal to 1.5 cm in diameter incidentally noted on imaging study 10/18/2019   CKD (chronic kidney disease) stage 3, GFR 30-59 ml/min (HCC) 10/18/2019   Microcytic anemia 10/16/2019   Senile osteoporosis 06/11/2019   Current severe episode of major depressive disorder without psychotic features ([place]) 06/11/2019   Myalgia due to statin 06/11/2019   Esophageal dysphagia 05/03/2018   Iron deficiency anemia 03/18/2018   Other age-related cataract 05/31/2016   Paroxysmal atrial fibrillation ([place]) 04/15/2016   Other seborrheic keratosis 01/03/2013   Coronary atherosclerosis of native coronary artery 01/03/2013   Vitamin D deficiency 12/19/2012   Mixed hyperlipidemia 12/19/2012   Essential (primary) hypertension 12/19/2012   Chronic obstructive pulmonary disease, unspecified ([place]) 12/19/2012   Age-related osteoporosis with current pathological fracture with routine healing 12/19/2012   PCP:  Glean Hess, MD Pharmacy:   [facility] - [place], [place] [address] Phone: [phone] Fax: [phone]  [facility] [address], [place] - [place] [address] Phone: [phone] Fax: [phone]  [facility] - [place], [place] [address] Phone: [phone] Fax: [phone]     Social Determinants of Health (SDOH) Social History: SDOH Screenings   Food Insecurity: No Food Insecurity (06/08/2022)  Housing: Low Risk  (06/08/2022)  Transportation Needs: No Transportation Needs (06/08/2022)  Utilities: Not At Risk (06/08/2022)  Alcohol Screen: Low Risk  (12/08/2021)  Depression (PHQ2-9): High Risk (04/06/2022)  Financial Resource Strain: Low Risk  (12/08/2021)  Physical Activity: Inactive (09/26/2019)  Social  Connections: Socially Isolated (12/08/2021)  Stress: Stress Concern Present (09/26/2019)  Tobacco Use: Medium Risk (06/08/2022)   SDOH Interventions:     Readmission Risk Interventions     No data to display

## 2022-06-10 NOTE — Progress Notes (Signed)
Physical Therapy Treatment Patient Details Name: Jessica Howell MRN: [id number] DOB: [birth year] Today's Date: 06/10/2022   History of Present Illness Patient is a 88 year old female presenting from ALF after sustaining a fall with weakness and shortness of breath. Found to be in congestive heart failure and had abnormal UA.    PT Comments    Patient seated in the chair on arrival to room. She reports feeling fatigued and wanting to return to bed. Min guard assistance provided for stand step transfer back to bed with activity tolerance limited by fatigue. Continue to recommend return to ALF with home health PT. Will continue to follow while in the hospital to maximize independence and facilitate return to prior level of function.    Recommendations for follow up therapy are one component of a multi-disciplinary discharge planning process, led by the attending physician.  Recommendations may be updated based on patient status, additional functional criteria and insurance authorization.  Follow Up Recommendations  Home health PT     Assistance Recommended at Discharge Set up Supervision/Assistance  Patient can return home with the following Help with stairs or ramp for entrance;Assist for transportation;Assistance with cooking/housework   Equipment Recommendations  None recommended by PT    Recommendations for Other Services       Precautions / Restrictions Precautions Precautions: Fall Restrictions Weight Bearing Restrictions: No     Mobility  Bed Mobility Overal bed mobility: Needs Assistance         Sit to supine: Supervision   General bed mobility comments: head of bed flat and no use of bed rails. incresed time required to complete tasks    Transfers Overall transfer level: Needs assistance Equipment used: None Transfers: Bed to chair/wheelchair/BSC     Step pivot transfers: Min guard       General transfer comment: occasional cues for safety. further mobility  deferred due to fatigue and patient reuqesting to return o bed    Ambulation/Gait                   Stairs             Wheelchair Mobility    Modified Rankin (Stroke Patients Only)       Balance Overall balance assessment: Needs assistance Sitting-balance support: Feet supported Sitting balance-Leahy Scale: Good     Standing balance support: Bilateral upper extremity supported, Reliant on assistive device for balance Standing balance-Leahy Scale: Fair                              Cognition Arousal/Alertness: Awake/alert Behavior During Therapy: WFL for tasks assessed/performed, Flat affect Overall Cognitive Status: No family/caregiver present to determine baseline cognitive functioning                               Problem Solving: Slow processing, Difficulty sequencing, Requires verbal cues, Requires tactile cues          Exercises General Exercises - Lower Extremity Short Arc Quad: AAROM, Strengthening, Both (x 12 reps each) Other Exercises Other Exercises: verbal cues for exercise technique, encouraged LE movement    General Comments General comments (skin integrity, edema, etc.): vital signs stable on 3 L via [place] except BP elevated to 197/70 with NT In room, RN notified via secure chat      Pertinent Vitals/Pain Pain Assessment Pain Assessment: Faces Faces Pain Scale: Hurts little more  Pain Location: abdomen Pain Descriptors / Indicators: Sore Pain Intervention(s): Limited activity within patient's tolerance, Monitored during session, Repositioned    Home Living                          Prior Function            PT Goals (current goals can now be found in the care plan section) Acute Rehab PT Goals Patient Stated Goal: to be discharged PT Goal Formulation: With patient Time For Goal Achievement: 06/23/22 Potential to Achieve Goals: Good Progress towards PT goals: Progressing toward goals     Frequency    Min 2X/week      PT Plan Current plan remains appropriate    Co-evaluation              AM-PAC PT "6 Clicks" Mobility   Outcome Measure  Help needed turning from your back to your side while in a flat bed without using bedrails?: None Help needed moving from lying on your back to sitting on the side of a flat bed without using bedrails?: A Little Help needed moving to and from a bed to a chair (including a wheelchair)?: A Little Help needed standing up from a chair using your arms (e.g., wheelchair or bedside chair)?: A Little Help needed to walk in hospital room?: A Little Help needed climbing 3-5 steps with a railing? : A Little 6 Click Score: 19    End of Session Equipment Utilized During Treatment: Oxygen Activity Tolerance: Patient tolerated treatment well Patient left: in bed;with call bell/phone within reach;with bed alarm set Nurse Communication: Mobility status PT Visit Diagnosis: Unsteadiness on feet (R26.81);Muscle weakness (generalized) (M62.81)     Time: 1530-1540 PT Time Calculation (min) (ACUTE ONLY): 10 min  Charges:  $Therapeutic Activity: 8-22 mins                     Minna Merritts, PT, MPT    Percell Locus 06/10/2022, 3:44 PM

## 2022-06-10 NOTE — Progress Notes (Signed)
  PROGRESS NOTE    Jessica Howell  EVO:[id number] DOB: May 03, 1934 DOA: 06/08/2022 PCP: Glean Hess, MD  [id number]  LOS: 2 days   Brief hospital course:   Assessment & Plan: Jessica Howell is a 88 y.o. Caucasian female with medical history significant for systolic CHF, COPD, depression, GERD, hypertension, dyslipidemia, osteoarthritis and atrial fibrillation, who presented to the emergency room with acute onset of a fall at home.   She was having dyspnea when she came to the ER and Pulse oximetry was 86% on room air and 95 to 98% on 3 L of O2 by nasal cannula.    * Acute on chronic systolic CHF (congestive heart failure) ([place]) # Moderate pulmonary hypertension   -last Echo in Jan 2024 with LVEF 45-50%.  Current Echo 55-60% with grade II DD, with severe pulm HTN. --cardio consulted --cont IV lasix diuresis per cardio --decrease metoprolol XL from '200mg'$  to '100mg'$  daily (bradycardia) --change lisinopril '40mg'$  daily to losartan '50mg'$  daily --conitnue spiro '25mg'$  daily.  --No SGLT2i with ongoing cystitis.   acute respiratory failure with hypoxia --presumed due to CHF, however, CXR didn't show obvious fluid. --currently on 3L --cont IV diuresis --Continue supplemental O2 to keep sats >=90%, wean as tolerated  Acute lower UTI --started on IV aztreonam and de-escalated to cefdinir --urine cx pos for ESBL E coli --fosfomycin x1 today  Fall at home, initial encounter --PT/OT  Hypertensive urgency - cont current regimen of amlodipine, clonidine, hydralazine, lisinopril, Toprol and aldactone  GERD without esophagitis - continue PPI therapy.  Depression - cont Lexapro  COPD - continue Pulmicort.  Paroxysmal atrial fibrillation (HCC) - The patient is obviously a fall risk for anticoagulation. --decrease metoprolol XL from '200mg'$  to '100mg'$  daily (bradycardia)  # Demand ischemia --trop 33, 34, flat  CKD3a --Cr stable   DVT prophylaxis: Lovenox SQ Code Status: Full code   Family Communication:  Level of care: Telemetry Cardiac Dispo:   The patient is from: home Anticipated d/c is to: home Anticipated d/c date is: tomorrow   Subjective and Interval History:  Pt said she couldn't chew her food.  Reported good urine output.   Objective: Vitals:   06/10/22 0900 06/10/22 1045 06/10/22 1206 06/10/22 1556  BP: (!) 197/69  (!) 197/70 (!) 154/64  Pulse: 61   (!) 58  Resp: 20   18  Temp: 99 F (37.2 C)  98 F (36.7 C) 98.4 F (36.9 C)  TempSrc: Oral  Oral Oral  SpO2: 99%  98% 99%  Weight:  59.2 kg    Height:        Intake/Output Summary (Last 24 hours) at 06/10/2022 1711 Last data filed at 06/10/2022 1100 Gross per 24 hour  Intake 750 ml  Output 800 ml  Net -50 ml   Filed Weights   06/08/22 1644 06/09/22 0833 06/10/22 1045  Weight: 61.5 kg 60.4 kg 59.2 kg    Examination:   Constitutional: NAD, AAOx3 HEENT: conjunctivae and lids normal, EOMI CV: No cyanosis.   RESP: normal respiratory effort, on 3L Neuro: II - XII grossly intact.   Psych: Normal mood and affect.  Appropriate judgement and reason   Data Reviewed: I have personally reviewed labs and imaging studies  Time spent: 35 minutes  Enzo Bi, MD [facility] If 7PM-7AM, please contact night-coverage 06/10/2022, 5:11 PM

## 2022-06-10 NOTE — Progress Notes (Signed)
[place] NOTE       Patient ID: Jessica Howell MRN: [id number] DOB/AGE: 1934-10-17 88 y.o.  Admit date: 06/08/2022 Referring Physician Dr, Eugenie Norrie Primary Physician Dr. Halina Maidens Primary Cardiologist Dr. Nehemiah Massed Reason for Consultation AoCHF  HPI: Jessica Howell is an 62yoF with a PMH of HFmrEF (45-50%, g3DD, mild MR, mod pulm HTN 04/2022), HTN, HLD, CAD, paroxysmal AF (declined AC), COPD, hx tobacco use, CKD 3, cognitive impairment who presented to [facility] ED 06/08/22 after a fall at home, was hypoxic when EMS arrived and placed her on 3 L.  BNP was elevated at 1200 concerning for acute on chronic CHF for which cardiology is consulted.  Interval History: -fatigued this morning. Laying flat in bed. -no chest pain, shortness of breath, or other complaints -continues to diurese well on IV lasix Net IO Since Admission: -4,880 mL [06/10/22 1008]    Review of systems complete and found to be negative unless listed above     Past Medical History:  Diagnosis Date   A-fib ([place])    Allergy    Anemia    CHF (congestive heart failure) (HCC)    Chronic anticoagulation, recent, Xarelto  10/18/2019   COPD (chronic obstructive pulmonary disease) ([place])    Depression    Feeling grief    GERD (gastroesophageal reflux disease)    Hyperlipidemia    Hypertension    Hypertensive urgency 10/18/2019   Hypokalemia 09/06/2016   Last Assessment & Plan:  Formatting of this note is different from the original. Will continue to monitor; ordered labs as below. Lab Results  Component Value Date   K 3.2 (L) 04/12/2016   K 3.5 04/10/2016   K 4.2 04/09/2016   K 4.4 11/21/2013   K 4.4 01/03/2013   Myocardial infarction [facility])    Osteoarthritis    Post-nasal drip 11/10/2015   Formatting of this note might be different from the original. Overview:   (Working Diagnosis)  Last Assessment & Plan:  Formatting of this note might be different from the original. Continue OTC flonase 2 sprays  daily Likely contributing to cough.  Will see if flonase helps with cough and PND   Right wrist pain 06/20/2015   Last Assessment & Plan:  Formatting of this note might be different from the original. Significant edema, and some grip and extension weakness, and persistent severe pain. She was not able to tolerate splint Reviewed xr of her recent ED visit No fractures identified (except old fracture on radial head)  +++ snuff box tenderness persists Referred to [facility] clinic Information provide   Upper GI bleed 10/19/2019   Vitamin D deficiency     Past Surgical History:  Procedure Laterality Date   ABDOMINAL HYSTERECTOMY     BREAST SURGERY     COLONOSCOPY WITH PROPOFOL N/A 10/21/2019   Procedure: COLONOSCOPY WITH PROPOFOL;  Surgeon: Jonathon Bellows, MD;  Location: [facility] ENDOSCOPY;  Service: Gastroenterology;  Laterality: N/A;   ESOPHAGOGASTRODUODENOSCOPY (EGD) WITH PROPOFOL N/A 10/20/2019   Procedure: ESOPHAGOGASTRODUODENOSCOPY (EGD) WITH PROPOFOL;  Surgeon: Lin Landsman, MD;  Location: [facility] ENDOSCOPY;  Service: Gastroenterology;  Laterality: N/A;   FOREARM SURGERY     GIVENS CAPSULE STUDY N/A 10/21/2019   Procedure: GIVENS CAPSULE STUDY;  Surgeon: Jonathon Bellows, MD;  Location: [facility] ENDOSCOPY;  Service: Gastroenterology;  Laterality: N/A;   TREATMENT INTERTROCHANTERIC, PERITROCHANTERIC, OR SUBTROCHANTERIC FEMUR FX; W/INTRAMED IMPLANT W/WO SCREWS Left 01/2020    Medications Prior to Admission  Medication Sig Dispense Refill Last  Dose   acetaminophen (TYLENOL) 325 MG tablet Take 325 mg by mouth every 6 (six) hours as needed for mild pain, moderate pain, fever or headache.   unk   albuterol (PROVENTIL) (2.5 MG/3ML) 0.083% nebulizer solution INHALE THE CONTENTS OF 1 VIAL VIA NEBULIZER EVERY 6 HOURS AS NEEDED FOR SHORTNESS OF BREATH OR FOR WHEEZING 540 mL 0 unk   albuterol (VENTOLIN HFA) 108 (90 Base) MCG/ACT inhaler Inhale 2 puffs into the lungs every 6 (six) hours as needed. 54 g 1 unk    amLODipine (NORVASC) 10 MG tablet TAKE 1 TABLET EVERY DAY 90 tablet 3 06/07/2022 at 0800   budesonide (PULMICORT) 0.25 MG/2ML nebulizer solution Take 0.25 mg by nebulization daily.   06/07/2022 at 0800   cloNIDine (CATAPRES) 0.1 MG tablet TAKE 1 TABLET TWICE DAILY (Patient taking differently: Take 0.1 mg by mouth 3 (three) times daily.) 180 tablet 0 06/07/2022 at 2000   dicyclomine (BENTYL) 10 MG capsule Take 1 capsule (10 mg total) by mouth in the morning, at noon, in the evening, and at bedtime. 360 capsule 1 06/07/2022 at 2000   escitalopram (LEXAPRO) 20 MG tablet TAKE 1 TABLET EVERY DAY 90 tablet 3 06/07/2022 at 0800   gabapentin (NEURONTIN) 100 MG capsule TAKE 1 CAPSULE TWICE DAILY 180 capsule 3 06/07/2022 at 2000   hydrALAZINE (APRESOLINE) 25 MG tablet Take 25 mg by mouth every 8 (eight) hours.   06/07/2022 at 2200   ipratropium-albuterol (DUONEB) 0.5-2.5 (3) MG/3ML SOLN Take 3 mLs by nebulization every 6 (six) hours.   06/08/2022 at 0000   lisinopril (ZESTRIL) 40 MG tablet TAKE 1 TABLET EVERY DAY 90 tablet 3 06/07/2022 at 0800   Melatonin 5 MG CHEW Chew by mouth.    unk   metoprolol (TOPROL-XL) 200 MG 24 hr tablet TAKE 1 TABLET EVERY DAY 90 tablet 3 06/07/2022 at 0800   montelukast (SINGULAIR) 10 MG tablet TAKE 1 TABLET AT BEDTIME 90 tablet 3 06/07/2022 at 2000   nitroGLYCERIN (NITROSTAT) 0.4 MG SL tablet Place 0.4 mg under the tongue every 5 (five) minutes as needed for chest pain.   unk   omeprazole (PRILOSEC) 20 MG capsule TAKE 1 CAPSULE TWICE DAILY BEFORE MEALS 180 capsule 3 06/07/2022 at 2000   spironolactone (ALDACTONE) 25 MG tablet Take 25 mg by mouth daily.   06/07/2022 at 0800   torsemide (DEMADEX) 10 MG tablet Take 1 tablet (10 mg total) by mouth daily. 90 tablet 0 06/07/2022 at 0800   aspirin EC 81 MG tablet Take 81 mg by mouth daily. Swallow whole. (Patient not taking: Reported on 06/08/2022)   Not Taking   budesonide (PULMICORT) 0.5 MG/2ML nebulizer solution Take 2 mLs (0.5 mg total) by  nebulization 2 (two) times daily. (Patient not taking: Reported on 06/08/2022) 360 mL 1 Not Taking   CALCIUM PO Take 600 mg by mouth 2 (two) times daily. (Patient not taking: Reported on 06/08/2022)   Not Taking   Cyanocobalamin (VITAMIN B 12 PO) Take 1,000 mcg by mouth daily. (Patient not taking: Reported on 06/08/2022)   Not Taking   ferrous gluconate (FERGON) 324 MG tablet TAKE 1 TABLET EVERY DAY (Patient not taking: Reported on 06/08/2022) 90 tablet 1 Not Taking   fluticasone (FLONASE) 50 MCG/ACT nasal spray Place 2 sprays into both nostrils daily. (Patient not taking: Reported on 06/08/2022)   Not Taking   Misc Natural Products (FIBER 7 PO) Take by mouth. (Patient not taking: Reported on 06/08/2022)   Not Taking   vitamin C (  ASCORBIC ACID) 500 MG tablet Take 500 mg by mouth daily. (Patient not taking: Reported on 06/08/2022)   Not Taking    Social History   Socioeconomic History   Marital status: Widowed    Spouse name: Not on file   Number of children: 1   Years of education: Not on file   Highest education level: Not on file  Occupational History   Occupation: retired  Tobacco Use   Smoking status: Former    Packs/day: 0.50    Years: 45.00    Total pack years: 22.50    Types: Cigarettes    Quit date: 06/10/2004    Years since quitting: 18.0   Smokeless tobacco: Never  Vaping Use   Vaping Use: Never used  Substance and Sexual Activity   Alcohol use: Never   Drug use: Not Currently   Sexual activity: Not Currently  Other Topics Concern   Not on file  Social History Narrative   Living alone. Husband passed away in 2018-11-23. Daughter checking in on her and great grandson checks in also.    Social Determinants of Health   Financial Resource Strain: Low Risk  (12/08/2021)   Overall Financial Resource Strain (CARDIA)    Difficulty of Paying Living Expenses: Not hard at all  Food Insecurity: No Food Insecurity (06/08/2022)   Hunger Vital Sign    Worried About Running Out of Food  in the Last Year: Never true    Ran Out of Food in the Last Year: Never true  Transportation Needs: No Transportation Needs (06/08/2022)   PRAPARE - Hydrologist (Medical): No    Lack of Transportation (Non-Medical): No  Physical Activity: Inactive (09/26/2019)   Exercise Vital Sign    Days of Exercise per Week: 0 days    Minutes of Exercise per Session: 0 min  Stress: Stress Concern Present (09/26/2019)   [place]    Feeling of Stress : Very much  Social Connections: Socially Isolated (12/08/2021)   Social Connection and Isolation Panel [NHANES]    Frequency of Communication with Friends and Family: Never    Frequency of Social Gatherings with Friends and Family: Never    Attends Religious Services: Never    Marine scientist or Organizations: No    Attends Archivist Meetings: Never    Marital Status: Widowed  Intimate Partner Violence: Not At Risk (06/08/2022)   Humiliation, Afraid, Rape, and Kick questionnaire    Fear of Current or Ex-Partner: No    Emotionally Abused: No    Physically Abused: No    Sexually Abused: No    Family History  Problem Relation Age of Onset   Diabetes Mother    Hypertension Mother    Cancer Father        esoph.    COPD Sister    Heart disease Brother    Early death Son       Intake/Output Summary (Last 24 hours) at 06/10/2022 1007 Last data filed at 06/10/2022 0500 Gross per 24 hour  Intake 270 ml  Output 1100 ml  Net -830 ml     Vitals:   06/10/22 0012 06/10/22 0405 06/10/22 0820 06/10/22 0900  BP: (!) 178/60 (!) 184/73  (!) 197/69  Pulse: (!) 58 60  61  Resp: 18 18  20  $ Temp: 98.5 F (36.9 C) 98.3 F (36.8 C)  99 F (37.2 C)  TempSrc: Oral Oral  Oral  SpO2: 99% 97% 100% 99%  Weight:      Height:        PHYSICAL EXAM General: Elderly and frail-appearing Caucasian female, in no acute distress.  Sitting upright in PCU  bed. HEENT:  Normocephalic and atraumatic. Very hard of hearing, reads lips ok.  Neck:  No JVD.  Lungs: Normal respiratory effort on 3 L by nasal cannula.  Bibasilar crackles, some improvement in aeration. Heart: HRRR . Normal S1 and S2 without gallops or murmurs.  Abdomen: Non-distended appearing.  Msk: Generalized weakness Extremities: Warm and well perfused. No clubbing, cyanosis.  No peripheral edema.  Neuro: Awake and alert.  Psych:  Answers simple questions but is a limited historian   Labs: Basic Metabolic Panel: Recent Labs    06/09/22 0416 06/10/22 0401  NA 137 136  K 3.2* 3.6  CL 105 103  CO2 25 25  GLUCOSE 99 98  BUN 12 13  CREATININE 1.04* 1.05*  CALCIUM 9.4 9.4  MG  --  2.0    Liver Function Tests: Recent Labs    06/08/22 0307  AST 19  ALT 12  ALKPHOS 67  BILITOT 0.7  PROT 6.4*  ALBUMIN 3.2*    No results for input(s): "LIPASE", "AMYLASE" in the last 72 hours. CBC: Recent Labs    06/08/22 0307 06/08/22 1156 06/10/22 0401  WBC 8.7 8.1 8.5  NEUTROABS 6.0 5.5  --   HGB 9.6* 10.4* 10.9*  HCT 30.8* 33.4* 34.4*  MCV 84.8 84.6 83.9  PLT 252 PLATELET CLUMPS NOTED ON SMEAR, UNABLE TO ESTIMATE 193    Cardiac Enzymes: Recent Labs    06/08/22 0307 06/08/22 0514  TROPONINIHS 33* 34*    BNP: Recent Labs    06/08/22 0307  BNP 1,226.2*    D-Dimer: No results for input(s): "DDIMER" in the last 72 hours. Hemoglobin A1C: No results for input(s): "HGBA1C" in the last 72 hours. Fasting Lipid Panel: No results for input(s): "CHOL", "HDL", "LDLCALC", "TRIG", "CHOLHDL", "LDLDIRECT" in the last 72 hours. Thyroid Function Tests: No results for input(s): "TSH", "T4TOTAL", "T3FREE", "THYROIDAB" in the last 72 hours.  Invalid input(s): "FREET3" Anemia Panel: No results for input(s): "VITAMINB12", "FOLATE", "FERRITIN", "TIBC", "IRON", "RETICCTPCT" in the last 72 hours.   Radiology: ECHOCARDIOGRAM COMPLETE  Result Date: 06/09/2022    ECHOCARDIOGRAM  REPORT   Patient Name:   Jessica Howell Date of Exam: 06/08/2022 Medical Rec #:  [id number]     Height:       60.0 in Accession #:    [id number]    Weight:       150.4 lb Date of Birth:  18-Jan-1935     BSA:          1.653 m Patient Age:    48 years      BP:           178/78 mmHg Patient Gender: F             HR:           63 bpm. Exam Location:  [facility] Procedure: 2D Echo, Cardiac Doppler and Color Doppler Indications:     CHF  History:         Patient has no prior history of Echocardiogram examinations.                  CHF, CAD, COPD, Arrythmias:Atrial Fibrillation; Risk                  Factors:Hypertension and  Dyslipidemia. Sick sinus syndrome.  Sonographer:     Wenda Low Referring Phys:  [id number] Roselle Sebrina Kessner Diagnosing Phys: Yolonda Kida MD IMPRESSIONS  1. Left ventricular ejection fraction, by estimation, is 55 to 60%. The left ventricle has normal function. The left ventricle demonstrates global hypokinesis. There is moderate concentric left ventricular hypertrophy. Left ventricular diastolic parameters are consistent with Grade II diastolic dysfunction (pseudonormalization).  2. Right ventricular systolic function is low normal. The right ventricular size is normal. There is severely elevated pulmonary artery systolic pressure.  3. Left atrial size was mild to moderately dilated.  4. Right atrial size was mildly dilated.  5. The mitral valve is normal in structure. Moderate mitral valve regurgitation.  6. The aortic valve is normal in structure. Aortic valve regurgitation is not visualized. Aortic valve sclerosis is present, with no evidence of aortic valve stenosis. FINDINGS  Left Ventricle: Left ventricular ejection fraction, by estimation, is 55 to 60%. The left ventricle has normal function. The left ventricle demonstrates global hypokinesis. The left ventricular internal cavity size was normal in size. There is moderate concentric left ventricular hypertrophy. Left ventricular diastolic  parameters are consistent with Grade II diastolic dysfunction (pseudonormalization). Right Ventricle: The right ventricular size is normal. No increase in right ventricular wall thickness. Right ventricular systolic function is low normal. There is severely elevated pulmonary artery systolic pressure. The tricuspid regurgitant velocity is 3.86 m/s, and with an assumed right atrial pressure of 15 mmHg, the estimated right ventricular systolic pressure is [id number] mmHg. Left Atrium: Left atrial size was mild to moderately dilated. Right Atrium: Right atrial size was mildly dilated. Pericardium: There is no evidence of pericardial effusion. Mitral Valve: The mitral valve is normal in structure. Moderate mitral valve regurgitation. MV peak gradient, 11.4 mmHg. The mean mitral valve gradient is 2.0 mmHg. Tricuspid Valve: The tricuspid valve is normal in structure. Tricuspid valve regurgitation is mild. Aortic Valve: The aortic valve is normal in structure. Aortic valve regurgitation is not visualized. Aortic valve sclerosis is present, with no evidence of aortic valve stenosis. Aortic valve mean gradient measures 6.0 mmHg. Aortic valve peak gradient measures 13.4 mmHg. Aortic valve area, by VTI measures 2.24 cm. Pulmonic Valve: The pulmonic valve was normal in structure. Pulmonic valve regurgitation is trivial. Aorta: The ascending aorta was not well visualized. IAS/Shunts: No atrial level shunt detected by color flow Doppler.  LEFT VENTRICLE PLAX 2D LVIDd:         4.30 cm   Diastology LVIDs:         2.90 cm   LV e' medial:    8.70 cm/s LV PW:         1.50 cm   LV E/e' medial:  13.8 LV IVS:        1.60 cm   LV e' lateral:   9.36 cm/s LVOT diam:     2.00 cm   LV E/e' lateral: 12.8 LV SV:         99 LV SV Index:   60 LVOT Area:     3.14 cm  RIGHT VENTRICLE RV Basal diam:  3.90 cm RV Mid diam:    3.20 cm RV S prime:     10.90 cm/s TAPSE (M-mode): 2.1 cm LEFT ATRIUM             Index        RIGHT ATRIUM           Index LA  diam:  4.70 cm 2.84 cm/m   RA Area:     22.90 cm LA Vol (A2C):   87.1 ml 52.68 ml/m  RA Volume:   72.50 ml  43.85 ml/m LA Vol (A4C):   97.2 ml 58.79 ml/m LA Biplane Vol: 96.0 ml 58.06 ml/m  AORTIC VALVE                     PULMONIC VALVE AV Area (Vmax):    2.18 cm      PV Vmax:       1.11 m/s AV Area (Vmean):   2.22 cm      PV Peak grad:  4.9 mmHg AV Area (VTI):     2.24 cm AV Vmax:           183.00 cm/s AV Vmean:          115.000 cm/s AV VTI:            0.440 m AV Peak Grad:      13.4 mmHg AV Mean Grad:      6.0 mmHg LVOT Vmax:         127.00 cm/s LVOT Vmean:        81.100 cm/s LVOT VTI:          0.314 m LVOT/AV VTI ratio: 0.71  AORTA Ao Root diam: 3.10 cm MITRAL VALVE                TRICUSPID VALVE MV Area (PHT): 4.60 cm     TR Peak grad:   59.6 mmHg MV Area VTI:   2.43 cm     TR Vmax:        386.00 cm/s MV Peak grad:  11.4 mmHg MV Mean grad:  2.0 mmHg     SHUNTS MV Vmax:       1.69 m/s     Systemic VTI:  0.31 m MV Vmean:      62.5 cm/s    Systemic Diam: 2.00 cm MV Decel Time: 165 msec MV E velocity: 120.00 cm/s MV A velocity: 51.90 cm/s MV E/A ratio:  2.31 Yolonda Kida MD Electronically signed by Yolonda Kida MD Signature Date/Time: 06/09/2022/6:46:54 PM    Final    DG Thoracic Spine 2 View  Result Date: 06/08/2022 CLINICAL DATA:  Back pain following fall EXAM: THORACIC SPINE 2 VIEWS COMPARISON:  Chest radiograph 10/17/2019 FINDINGS: Stable compression deformity of T6 without associated retropulsion. No acute fracture or listhesis of the thoracic spine. Remaining vertebral body height is preserved. Mild endplate changes noted within the midthoracic spine in keeping with changes of mild degenerative disc disease. Paraspinal soft tissues are unremarkable. IMPRESSION: 1. No acute fracture or listhesis. 2. Stable T6 compression deformity. Electronically Signed   By: Fidela Salisbury M.D.   On: 06/08/2022 03:53   DG Chest Port 1 View  Result Date: 06/08/2022 CLINICAL DATA:  Sepsis, back  pain EXAM: PORTABLE CHEST 1 VIEW COMPARISON:  10/17/2019 FINDINGS: The lungs are symmetrically well expanded. Chronic interstitial changes are seen. No confluent pulmonary infiltrate. No pneumothorax or pleural effusion. Atherosclerotic calcification noted within the aorta. Mild cardiomegaly is stable when accounting for supine positioning. Pulmonary vascularity is normal. No acute bone abnormality. IMPRESSION: 1. No active disease. 2. Stable cardiomegaly. Electronically Signed   By: Fidela Salisbury M.D.   On: 06/08/2022 03:51    ECHO 05/05/2022   1. The left ventricle is normal in size with mildly increased wall  thickness.   2. The left  ventricular systolic function is mildly decreased, LVEF is  visually estimated at 45-50%.    3. There is grade III diastolic dysfunction (severely elevated filling  pressure).   4. The mitral valve leaflets are mildly thickened with normal leaflet  mobility.   5. There is mild centrally directed mitral regurgitation.    6. The left atrium is mildly dilated in size.    7. The aortic valve is trileaflet with moderately thickened leaflets with  normal excursion.    8. The right ventricle is upper normal in size, with normal systolic  function.   9. There is mild tricuspid regurgitation.    10. There is moderate pulmonary hypertension.    11. The right atrium is moderately dilated in size.    12. IVC size and inspiratory change suggest elevated right atrial pressure.  (10-20 mmHg).   Leane Call 11/2019 Indeterminate Lexiscan infusion EKG  Fixed inferior myocardial perfusion defect consistent previous infarct  and/or scar and minimal reversible lateral myocardial perfusion defect  consistent with possible myocardial ischemia.  Clinical correlation advised   TELEMETRY reviewed by me (LT) 06/10/2022 : sinus brady to NSR 54-60s  EKG reviewed by me: NSR, nonspecific TW abnormality  Data reviewed by me (LT) 06/10/2022: Hospitalist progress note last 24  hours labs, imaging, telemetry vitals I's/O  Principal Problem:   Acute on chronic systolic CHF (congestive heart failure) (HCC) Active Problems:   Chronic obstructive pulmonary disease, unspecified ([place])   Hypertensive urgency   Peripheral neuropathy   Acute lower UTI   Fall at home, initial encounter   Depression   GERD without esophagitis   Acute respiratory failure with hypoxia (HCC)    ASSESSMENT AND PLAN:  Jessica Howell is an 76yoF with a PMH of HFmrEF (45-50%, g3DD, mild MR, mod pulm HTN 04/2022), HTN, HLD, CAD, paroxysmal AF (declined AC), COPD, hx tobacco use, CKD 3, cognitive impairment who presented to [facility] ED 06/08/22 after a fall at home, was hypoxic when EMS arrived and placed her on 3 L.  BNP was elevated at 1200 concerning for acute on chronic CHF for which cardiology is consulted.  # Cystitis # fall  Imaging without acute fx Management per primary team, s/p aztreonam, cefdinir and fosfomycin x 1   # Acute on chronic HFmrEF (45-50%, G3 DD 04/2022) Presents with an increased oxygen requirement, BNP of 1200, pulmonary vascular congestion on CXR. Patient is a limited historian, unclear what lead to this decompensation. Not grossly hypervolemic on exam. Good UOP after initial diuresis.  -increase this PM lasix to 93m, probably discharge on torsemide 225mdaily (prev on torsemide 1066maily) -continue GDMT: decrease metoprolol XL from 200m15m 100mg67mly (bradycardia), change lisinopril 40mg 76my to losartan 50mg d68m, conitnue spiro 25mg da81m No SGLT2i with ongoing cystitis.  -continue hydralazine 25 mg 3 times daily -strict I/O -anticipate discharge readiness from a cardiac perspective tomorrow, 2/16   # HTN [facility]qu[facility] Continue current regimen with the above + amlodipine 10mg dai54mnd clonidine 0.1mg BID. 49m Moderate pulmonary hypertension # COPD On 3L, unclear baseline requirement. Continue inhalers per primary +  diuresis as above  # CKD 3 Slight improvement with diuresis  # Paroxysmal atrial fibrillation In NSR on tele, not on AC at baseline with fall history   # demand ischemia  Minimal elevation with flat trend at 33, 34. In absence of chest pain or EKG changes most consistent with demand/supply mismatch and not ACS  This patient's plan of care was discussed and created with Dr. Clayborn Bigness and he is in agreement.  Signed: Tristan Schroeder , PA-C 06/10/2022, 10:07 AM [facility] Cardiology

## 2022-06-10 NOTE — Progress Notes (Signed)
Occupational Therapy Treatment Patient Details Name: Jessica Howell MRN: [id number] DOB: 12/11/1934 Today's Date: 06/10/2022   History of present illness Patient is a 88 year old female presenting from ALF after sustaining a fall with weakness and shortness of breath. Found to be in congestive heart failure and had abnormal UA.   OT comments  Chart reviewed, pt greeted in chair, oriented to self only. Pt agreeable to OT Tx session targeting improved indep in ADL task completion however BP 197/70 in sitting with legs up, pt requesting to rest. RN notified. Pt performed chair push ups 2x to reposition with MIN A, grooming with supervision. All other vital signs appear stable on 3 L via [place]. Discharge recommendation remains appropriate, OT will follow acutely.    Recommendations for follow up therapy are one component of a multi-disciplinary discharge planning process, led by the attending physician.  Recommendations may be updated based on patient status, additional functional criteria and insurance authorization.    Follow Up Recommendations  Home health OT     Assistance Recommended at Discharge Frequent or constant Supervision/Assistance  Patient can return home with the following  A little help with walking and/or transfers;A little help with bathing/dressing/bathroom;Assistance with cooking/housework;Direct supervision/assist for financial management;Direct supervision/assist for medications management;Assist for transportation;Help with stairs or ramp for entrance   Equipment Recommendations  None recommended by OT    Recommendations for Other Services      Precautions / Restrictions Precautions Precautions: Fall Restrictions Weight Bearing Restrictions: No       Mobility Bed Mobility               General bed mobility comments: NT in recliner pre/post session    Transfers Overall transfer level: Needs assistance                 General transfer comment:  reposition in chair, simulate BUE strength needed for STS with MIN A     Balance Overall balance assessment: Needs assistance Sitting-balance support: Feet supported Sitting balance-Leahy Scale: Good                                     ADL either performed or assessed with clinical judgement   ADL Overall ADL's : Needs assistance/impaired     Grooming: Sitting;Supervision/safety           Upper Body Dressing : Set up;Sitting                          Extremity/Trunk Assessment              Vision       Perception     Praxis      Cognition Arousal/Alertness: Awake/alert Behavior During Therapy: WFL for tasks assessed/performed, Flat affect Overall Cognitive Status: No family/caregiver present to determine baseline cognitive functioning Area of Impairment: Orientation, Attention, Memory, Following commands, Safety/judgement, Awareness, Problem solving                 Orientation Level: Disoriented to, Place, Time, Situation Current Attention Level: Focused Memory: Decreased short-term memory Following Commands: Follows one step commands with increased time Safety/Judgement: Decreased awareness of safety, Decreased awareness of deficits Awareness: Intellectual Problem Solving: Slow processing, Difficulty sequencing, Requires verbal cues, Requires tactile cues          Exercises      Shoulder Instructions  General Comments vital signs stable on 3 L via [place] except BP elevated to 197/70 with NT In room, RN notified via secure chat    Pertinent Vitals/ Pain       Pain Assessment Pain Assessment: Faces Faces Pain Scale: Hurts little more Pain Location: L elbow Pain Descriptors / Indicators: Sore Pain Intervention(s): Limited activity within patient's tolerance, Monitored during session, Repositioned  Home Living                                          Prior Functioning/Environment               Frequency  Min 2X/week        Progress Toward Goals  OT Goals(current goals can now be found in the care plan section)  Progress towards OT goals: Progressing toward goals     Plan Discharge plan remains appropriate    Co-evaluation                 AM-PAC OT "6 Clicks" Daily Activity     Outcome Measure   Help from another person eating meals?: None Help from another person taking care of personal grooming?: A Little Help from another person toileting, which includes using toliet, bedpan, or urinal?: A Little Help from another person bathing (including washing, rinsing, drying)?: A Lot Help from another person to put on and taking off regular upper body clothing?: A Little Help from another person to put on and taking off regular lower body clothing?: A Little 6 Click Score: 18    End of Session Equipment Utilized During Treatment: Oxygen  OT Visit Diagnosis: Unsteadiness on feet (R26.81);Other symptoms and signs involving cognitive function;Muscle weakness (generalized) (M62.81);Pain   Activity Tolerance Treatment limited secondary to medical complications (Comment) (high BP)   Patient Left in chair;with call bell/phone within reach;with chair alarm set   Nurse Communication Mobility status        Time: [id number] OT Time Calculation (min): 12 min  Charges: OT General Charges $OT Visit: 1 Visit OT Treatments $Self Care/Home Management : 8-22 mins  Shanon Payor, OTD OTR/L  06/10/22, 1:53 PM

## 2022-06-11 DIAGNOSIS — I5023 Acute on chronic systolic (congestive) heart failure: Secondary | ICD-10-CM | POA: Diagnosis not present

## 2022-06-11 LAB — CBC
HCT: 36.7 % (ref 36.0–46.0)
Hemoglobin: 11.6 g/dL — ABNORMAL LOW (ref 12.0–15.0)
MCH: 26.7 pg (ref 26.0–34.0)
MCHC: 31.6 g/dL (ref 30.0–36.0)
MCV: 84.4 fL (ref 80.0–100.0)
Platelets: 219 10*3/uL (ref 150–400)
RBC: 4.35 MIL/uL (ref 3.87–5.11)
RDW: 15.1 % (ref 11.5–15.5)
WBC: 8.6 10*3/uL (ref 4.0–10.5)
nRBC: 0 % (ref 0.0–0.2)

## 2022-06-11 LAB — BASIC METABOLIC PANEL
Anion gap: 9 (ref 5–15)
BUN: 14 mg/dL (ref 8–23)
CO2: 26 mmol/L (ref 22–32)
Calcium: 9.6 mg/dL (ref 8.9–10.3)
Chloride: 102 mmol/L (ref 98–111)
Creatinine, Ser: 1.1 mg/dL — ABNORMAL HIGH (ref 0.44–1.00)
GFR, Estimated: 49 mL/min — ABNORMAL LOW (ref >60)
Glucose, Bld: 99 mg/dL (ref 70–99)
Potassium: 3.8 mmol/L (ref 3.5–5.1)
Sodium: 137 mmol/L (ref 135–145)

## 2022-06-11 LAB — MAGNESIUM: Magnesium: 2 mg/dL (ref 1.7–2.4)

## 2022-06-11 MED ORDER — FUROSEMIDE 10 MG/ML IJ SOLN
40.0000 mg | Freq: Two times a day (BID) | INTRAMUSCULAR | Status: DC
  Administered 2022-06-11 – 2022-06-12 (×3): 40 mg via INTRAVENOUS
  Filled 2022-06-11 (×3): qty 4

## 2022-06-11 MED ORDER — HYDRALAZINE HCL 50 MG PO TABS
100.0000 mg | ORAL_TABLET | Freq: Three times a day (TID) | ORAL | Status: DC
  Administered 2022-06-11 – 2022-06-15 (×13): 100 mg via ORAL
  Filled 2022-06-11 (×13): qty 2

## 2022-06-11 NOTE — Progress Notes (Signed)
[place] NOTE       Patient ID: Jessica Howell MRN: [id number] DOB/AGE: 1934-12-07 88 y.o.  Admit date: 06/08/2022 Referring Physician Dr, Eugenie Norrie Primary Physician Dr. Halina Maidens Primary Cardiologist Dr. Nehemiah Massed Reason for Consultation AoCHF  HPI: Jessica Howell is an 58yoF with a PMH of HFmrEF (45-50%, g3DD, mild MR, mod pulm HTN 04/2022), HTN, HLD, CAD, paroxysmal AF (declined AC), COPD, hx tobacco use, CKD 3, cognitive impairment who presented to [facility] ED 06/08/22 after a fall at home, was hypoxic when EMS arrived and placed her on 3 L.  BNP was elevated at 1200 concerning for acute on chronic CHF for which cardiology is consulted.  Interval History:  -sitting up in chair, only complaint is back pain. Denies shortness of breath, chest pain or other complaints. -remains with supplemental O2 requirement with none at baseline -continues to diurese well on IV lasix Net IO Since Admission: -4,585 mL [06/11/22 1209]    Review of systems complete and found to be negative unless listed above     Past Medical History:  Diagnosis Date   A-fib ([place])    Allergy    Anemia    CHF (congestive heart failure) (HCC)    Chronic anticoagulation, recent, Xarelto  10/18/2019   COPD (chronic obstructive pulmonary disease) ([place])    Depression    Feeling grief    GERD (gastroesophageal reflux disease)    Hyperlipidemia    Hypertension    Hypertensive urgency 10/18/2019   Hypokalemia 09/06/2016   Last Assessment & Plan:  Formatting of this note is different from the original. Will continue to monitor; ordered labs as below. Lab Results  Component Value Date   K 3.2 (L) 04/12/2016   K 3.5 04/10/2016   K 4.2 04/09/2016   K 4.4 11/21/2013   K 4.4 01/03/2013   Myocardial infarction [facility])    Osteoarthritis    Post-nasal drip 11/10/2015   Formatting of this note might be different from the original. Overview:   (Working Diagnosis)  Last Assessment & Plan:  Formatting of this  note might be different from the original. Continue OTC flonase 2 sprays daily Likely contributing to cough.  Will see if flonase helps with cough and PND   Right wrist pain 06/20/2015   Last Assessment & Plan:  Formatting of this note might be different from the original. Significant edema, and some grip and extension weakness, and persistent severe pain. She was not able to tolerate splint Reviewed xr of her recent ED visit No fractures identified (except old fracture on radial head)  +++ snuff box tenderness persists Referred to [facility] Information provide   Upper GI bleed 10/19/2019   Vitamin D deficiency     Past Surgical History:  Procedure Laterality Date   ABDOMINAL HYSTERECTOMY     BREAST SURGERY     COLONOSCOPY WITH PROPOFOL N/A 10/21/2019   Procedure: COLONOSCOPY WITH PROPOFOL;  Surgeon: Jonathon Bellows, MD;  Location[facility] ENDOSCOPY;  Service: Gastroenterology;  Laterality: N/A;   ESOPHAGOGASTRODUODENOSCOPY (EGD) WITH PROPOFOL N/A 10/20/2019   Procedure: ESOPHAGOGASTRODUODENOSCOPY (EGD) WITH PROPOFOL;  Surgeon: Lin Landsman, MD;  Location: [facility] ENDOSCOPY;  Service: Gastroenterology;  Laterality: N/A;   FOREARM SURGERY     GIVENS CAPSULE STUDY N/A 10/21/2019   Procedure: GIVENS CAPSULE STUDY;  Surgeon: Jonathon Bellows, MD;  Location[facility] ENDOSCOPY;  Service: Gastroenterology;  Laterality: N/A;   TREATMENT INTERTROCHANTERIC, PERITROCHANTERIC, OR SUBTROCHANTERIC FEMUR FX; W/INTRAMED IMPLANT W/WO SCREWS Left 01/2020  Medications Prior to Admission  Medication Sig Dispense Refill Last Dose   acetaminophen (TYLENOL) 325 MG tablet Take 325 mg by mouth every 6 (six) hours as needed for mild pain, moderate pain, fever or headache.   unk   albuterol (PROVENTIL) (2.5 MG/3ML) 0.083% nebulizer solution INHALE THE CONTENTS OF 1 VIAL VIA NEBULIZER EVERY 6 HOURS AS NEEDED FOR SHORTNESS OF BREATH OR FOR WHEEZING 540 mL 0 unk   albuterol (VENTOLIN HFA) 108 (90 Base) MCG/ACT  inhaler Inhale 2 puffs into the lungs every 6 (six) hours as needed. 54 g 1 unk   amLODipine (NORVASC) 10 MG tablet TAKE 1 TABLET EVERY DAY 90 tablet 3 06/07/2022 at 0800   budesonide (PULMICORT) 0.25 MG/2ML nebulizer solution Take 0.25 mg by nebulization daily.   06/07/2022 at 0800   cloNIDine (CATAPRES) 0.1 MG tablet TAKE 1 TABLET TWICE DAILY (Patient taking differently: Take 0.1 mg by mouth 3 (three) times daily.) 180 tablet 0 06/07/2022 at 2000   dicyclomine (BENTYL) 10 MG capsule Take 1 capsule (10 mg total) by mouth in the morning, at noon, in the evening, and at bedtime. 360 capsule 1 06/07/2022 at 2000   escitalopram (LEXAPRO) 20 MG tablet TAKE 1 TABLET EVERY DAY 90 tablet 3 06/07/2022 at 0800   gabapentin (NEURONTIN) 100 MG capsule TAKE 1 CAPSULE TWICE DAILY 180 capsule 3 06/07/2022 at 2000   hydrALAZINE (APRESOLINE) 25 MG tablet Take 25 mg by mouth every 8 (eight) hours.   06/07/2022 at 2200   ipratropium-albuterol (DUONEB) 0.5-2.5 (3) MG/3ML SOLN Take 3 mLs by nebulization every 6 (six) hours.   06/08/2022 at 0000   lisinopril (ZESTRIL) 40 MG tablet TAKE 1 TABLET EVERY DAY 90 tablet 3 06/07/2022 at 0800   Melatonin 5 MG CHEW Chew by mouth.    unk   metoprolol (TOPROL-XL) 200 MG 24 hr tablet TAKE 1 TABLET EVERY DAY 90 tablet 3 06/07/2022 at 0800   montelukast (SINGULAIR) 10 MG tablet TAKE 1 TABLET AT BEDTIME 90 tablet 3 06/07/2022 at 2000   nitroGLYCERIN (NITROSTAT) 0.4 MG SL tablet Place 0.4 mg under the tongue every 5 (five) minutes as needed for chest pain.   unk   omeprazole (PRILOSEC) 20 MG capsule TAKE 1 CAPSULE TWICE DAILY BEFORE MEALS 180 capsule 3 06/07/2022 at 2000   spironolactone (ALDACTONE) 25 MG tablet Take 25 mg by mouth daily.   06/07/2022 at 0800   torsemide (DEMADEX) 10 MG tablet Take 1 tablet (10 mg total) by mouth daily. 90 tablet 0 06/07/2022 at 0800   aspirin EC 81 MG tablet Take 81 mg by mouth daily. Swallow whole. (Patient not taking: Reported on 06/08/2022)   Not Taking    budesonide (PULMICORT) 0.5 MG/2ML nebulizer solution Take 2 mLs (0.5 mg total) by nebulization 2 (two) times daily. (Patient not taking: Reported on 06/08/2022) 360 mL 1 Not Taking   CALCIUM PO Take 600 mg by mouth 2 (two) times daily. (Patient not taking: Reported on 06/08/2022)   Not Taking   Cyanocobalamin (VITAMIN B 12 PO) Take 1,000 mcg by mouth daily. (Patient not taking: Reported on 06/08/2022)   Not Taking   ferrous gluconate (FERGON) 324 MG tablet TAKE 1 TABLET EVERY DAY (Patient not taking: Reported on 06/08/2022) 90 tablet 1 Not Taking   fluticasone (FLONASE) 50 MCG/ACT nasal spray Place 2 sprays into both nostrils daily. (Patient not taking: Reported on 06/08/2022)   Not Taking   Misc Natural Products (FIBER 7 PO) Take by mouth. (Patient not taking: Reported  on 06/08/2022)   Not Taking   vitamin C (ASCORBIC ACID) 500 MG tablet Take 500 mg by mouth daily. (Patient not taking: Reported on 06/08/2022)   Not Taking    Social History   Socioeconomic History   Marital status: Widowed    Spouse name: Not on file   Number of children: 1   Years of education: Not on file   Highest education level: Not on file  Occupational History   Occupation: retired  Tobacco Use   Smoking status: Former    Packs/day: 0.50    Years: 45.00    Total pack years: 22.50    Types: Cigarettes    Quit date: 06/10/2004    Years since quitting: 18.0   Smokeless tobacco: Never  Vaping Use   Vaping Use: Never used  Substance and Sexual Activity   Alcohol use: Never   Drug use: Not Currently   Sexual activity: Not Currently  Other Topics Concern   Not on file  Social History Narrative   Living alone. Husband passed away in 12-01-2018. Daughter checking in on her and great grandson checks in also.    Social Determinants of Health   Financial Resource Strain: Low Risk  (12/08/2021)   Overall Financial Resource Strain (CARDIA)    Difficulty of Paying Living Expenses: Not hard at all  Food Insecurity: No Food  Insecurity (06/08/2022)   Hunger Vital Sign    Worried About Running Out of Food in the Last Year: Never true    Ran Out of Food in the Last Year: Never true  Transportation Needs: No Transportation Needs (06/08/2022)   PRAPARE - Hydrologist (Medical): No    Lack of Transportation (Non-Medical): No  Physical Activity: Inactive (09/26/2019)   Exercise Vital Sign    Days of Exercise per Week: 0 days    Minutes of Exercise per Session: 0 min  Stress: Stress Concern Present (09/26/2019)   [place]    Feeling of Stress : Very much  Social Connections: Socially Isolated (12/08/2021)   Social Connection and Isolation Panel [NHANES]    Frequency of Communication with Friends and Family: Never    Frequency of Social Gatherings with Friends and Family: Never    Attends Religious Services: Never    Marine scientist or Organizations: No    Attends Archivist Meetings: Never    Marital Status: Widowed  Intimate Partner Violence: Not At Risk (06/08/2022)   Humiliation, Afraid, Rape, and Kick questionnaire    Fear of Current or Ex-Partner: No    Emotionally Abused: No    Physically Abused: No    Sexually Abused: No    Family History  Problem Relation Age of Onset   Diabetes Mother    Hypertension Mother    Cancer Father        esoph.    COPD Sister    Heart disease Brother    Early death Son       Intake/Output Summary (Last 24 hours) at 06/11/2022 1209 Last data filed at 06/11/2022 1037 Gross per 24 hour  Intake 940 ml  Output 1125 ml  Net -185 ml    Vitals:   06/11/22 0847 06/11/22 0850 06/11/22 0855 06/11/22 1152  BP:    101/88  Pulse:    62  Resp:    16  Temp:    98.4 F (36.9 C)  TempSrc:    Oral  SpO2: (!) 89% (!) 86% 93% 98%  Weight:      Height:        PHYSICAL EXAM General: Elderly and frail-appearing Caucasian female, in no acute distress.  Sitting upright  in recliner HEENT:  Normocephalic and atraumatic. Very hard of hearing, reads lips ok.  Neck:  No JVD.  Lungs: Normal respiratory effort on 3 L by nasal cannula.  Bibasilar crackles, some improvement in aeration. Heart: HRRR . Normal S1 and S2 without gallops or murmurs.  Abdomen: Non-distended appearing.  Msk: Generalized weakness Extremities: Warm and well perfused. No clubbing, cyanosis.  No peripheral edema.  Neuro: Awake and alert.  Psych:  Answers simple questions but is a limited historian   Labs: Basic Metabolic Panel: Recent Labs    06/10/22 0401 06/11/22 0214  NA 136 137  K 3.6 3.8  CL 103 102  CO2 25 26  GLUCOSE 98 99  BUN 13 14  CREATININE 1.05* 1.10*  CALCIUM 9.4 9.6  MG 2.0 2.0   Liver Function Tests: No results for input(s): "AST", "ALT", "ALKPHOS", "BILITOT", "PROT", "ALBUMIN" in the last 72 hours.  No results for input(s): "LIPASE", "AMYLASE" in the last 72 hours. CBC: Recent Labs    06/10/22 0401 06/11/22 0214  WBC 8.5 8.6  HGB 10.9* 11.6*  HCT 34.4* 36.7  MCV 83.9 84.4  PLT 193 219   Cardiac Enzymes: No results for input(s): "CKTOTAL", "CKMB", "CKMBINDEX", "TROPONINIHS" in the last 72 hours.  BNP: No results for input(s): "BNP" in the last 72 hours.  D-Dimer: No results for input(s): "DDIMER" in the last 72 hours. Hemoglobin A1C: No results for input(s): "HGBA1C" in the last 72 hours. Fasting Lipid Panel: No results for input(s): "CHOL", "HDL", "LDLCALC", "TRIG", "CHOLHDL", "LDLDIRECT" in the last 72 hours. Thyroid Function Tests: No results for input(s): "TSH", "T4TOTAL", "T3FREE", "THYROIDAB" in the last 72 hours.  Invalid input(s): "FREET3" Anemia Panel: No results for input(s): "VITAMINB12", "FOLATE", "FERRITIN", "TIBC", "IRON", "RETICCTPCT" in the last 72 hours.   Radiology: ECHOCARDIOGRAM COMPLETE  Result Date: 06/09/2022    ECHOCARDIOGRAM REPORT   Patient Name:   Jessica Howell Date of Exam: 06/08/2022 Medical Rec #:  [id number]      Height:       60.0 in Accession #:    [id number]    Weight:       150.4 lb Date of Birth:  [birth year]     BSA:          1.653 m Patient Age:    42 years      BP:           178/78 mmHg Patient Gender: F             HR:           63 bpm. Exam Location:  [facility] Procedure: 2D Echo, Cardiac Doppler and Color Doppler Indications:     CHF  History:         Patient has no prior history of Echocardiogram examinations.                  CHF, CAD, COPD, Arrythmias:Atrial Fibrillation; Risk                  Factors:Hypertension and Dyslipidemia. Sick sinus syndrome.  Sonographer:     Wenda Low Referring Phys:  [id number] Ogden Diedre Maclellan Diagnosing Phys: Yolonda Kida MD IMPRESSIONS  1. Left ventricular ejection fraction, by estimation, is 55 to 60%. The left ventricle has  normal function. The left ventricle demonstrates global hypokinesis. There is moderate concentric left ventricular hypertrophy. Left ventricular diastolic parameters are consistent with Grade II diastolic dysfunction (pseudonormalization).  2. Right ventricular systolic function is low normal. The right ventricular size is normal. There is severely elevated pulmonary artery systolic pressure.  3. Left atrial size was mild to moderately dilated.  4. Right atrial size was mildly dilated.  5. The mitral valve is normal in structure. Moderate mitral valve regurgitation.  6. The aortic valve is normal in structure. Aortic valve regurgitation is not visualized. Aortic valve sclerosis is present, with no evidence of aortic valve stenosis. FINDINGS  Left Ventricle: Left ventricular ejection fraction, by estimation, is 55 to 60%. The left ventricle has normal function. The left ventricle demonstrates global hypokinesis. The left ventricular internal cavity size was normal in size. There is moderate concentric left ventricular hypertrophy. Left ventricular diastolic parameters are consistent with Grade II diastolic dysfunction (pseudonormalization). Right  Ventricle: The right ventricular size is normal. No increase in right ventricular wall thickness. Right ventricular systolic function is low normal. There is severely elevated pulmonary artery systolic pressure. The tricuspid regurgitant velocity is 3.86 m/s, and with an assumed right atrial pressure of 15 mmHg, the estimated right ventricular systolic pressure is [id number] mmHg. Left Atrium: Left atrial size was mild to moderately dilated. Right Atrium: Right atrial size was mildly dilated. Pericardium: There is no evidence of pericardial effusion. Mitral Valve: The mitral valve is normal in structure. Moderate mitral valve regurgitation. MV peak gradient, 11.4 mmHg. The mean mitral valve gradient is 2.0 mmHg. Tricuspid Valve: The tricuspid valve is normal in structure. Tricuspid valve regurgitation is mild. Aortic Valve: The aortic valve is normal in structure. Aortic valve regurgitation is not visualized. Aortic valve sclerosis is present, with no evidence of aortic valve stenosis. Aortic valve mean gradient measures 6.0 mmHg. Aortic valve peak gradient measures 13.4 mmHg. Aortic valve area, by VTI measures 2.24 cm. Pulmonic Valve: The pulmonic valve was normal in structure. Pulmonic valve regurgitation is trivial. Aorta: The ascending aorta was not well visualized. IAS/Shunts: No atrial level shunt detected by color flow Doppler.  LEFT VENTRICLE PLAX 2D LVIDd:         4.30 cm   Diastology LVIDs:         2.90 cm   LV e' medial:    8.70 cm/s LV PW:         1.50 cm   LV E/e' medial:  13.8 LV IVS:        1.60 cm   LV e' lateral:   9.36 cm/s LVOT diam:     2.00 cm   LV E/e' lateral: 12.8 LV SV:         99 LV SV Index:   60 LVOT Area:     3.14 cm  RIGHT VENTRICLE RV Basal diam:  3.90 cm RV Mid diam:    3.20 cm RV S prime:     10.90 cm/s TAPSE (M-mode): 2.1 cm LEFT ATRIUM             Index        RIGHT ATRIUM           Index LA diam:        4.70 cm 2.84 cm/m   RA Area:     22.90 cm LA Vol (A2C):   87.1 ml 52.68 ml/m   RA Volume:   72.50 ml  43.85 ml/m LA Vol (A4C):   97.2 ml  58.79 ml/m LA Biplane Vol: 96.0 ml 58.06 ml/m  AORTIC VALVE                     PULMONIC VALVE AV Area (Vmax):    2.18 cm      PV Vmax:       1.11 m/s AV Area (Vmean):   2.22 cm      PV Peak grad:  4.9 mmHg AV Area (VTI):     2.24 cm AV Vmax:           183.00 cm/s AV Vmean:          115.000 cm/s AV VTI:            0.440 m AV Peak Grad:      13.4 mmHg AV Mean Grad:      6.0 mmHg LVOT Vmax:         127.00 cm/s LVOT Vmean:        81.100 cm/s LVOT VTI:          0.314 m LVOT/AV VTI ratio: 0.71  AORTA Ao Root diam: 3.10 cm MITRAL VALVE                TRICUSPID VALVE MV Area (PHT): 4.60 cm     TR Peak grad:   59.6 mmHg MV Area VTI:   2.43 cm     TR Vmax:        386.00 cm/s MV Peak grad:  11.4 mmHg MV Mean grad:  2.0 mmHg     SHUNTS MV Vmax:       1.69 m/s     Systemic VTI:  0.31 m MV Vmean:      62.5 cm/s    Systemic Diam: 2.00 cm MV Decel Time: 165 msec MV E velocity: 120.00 cm/s MV A velocity: 51.90 cm/s MV E/A ratio:  2.31 Yolonda Kida MD Electronically signed by Yolonda Kida MD Signature Date/Time: 06/09/2022/6:46:54 PM    Final    DG Thoracic Spine 2 View  Result Date: 06/08/2022 CLINICAL DATA:  Back pain following fall EXAM: THORACIC SPINE 2 VIEWS COMPARISON:  Chest radiograph 10/17/2019 FINDINGS: Stable compression deformity of T6 without associated retropulsion. No acute fracture or listhesis of the thoracic spine. Remaining vertebral body height is preserved. Mild endplate changes noted within the midthoracic spine in keeping with changes of mild degenerative disc disease. Paraspinal soft tissues are unremarkable. IMPRESSION: 1. No acute fracture or listhesis. 2. Stable T6 compression deformity. Electronically Signed   By: Fidela Salisbury M.D.   On: 06/08/2022 03:53   DG Chest Port 1 View  Result Date: 06/08/2022 CLINICAL DATA:  Sepsis, back pain EXAM: PORTABLE CHEST 1 VIEW COMPARISON:  10/17/2019 FINDINGS: The lungs are  symmetrically well expanded. Chronic interstitial changes are seen. No confluent pulmonary infiltrate. No pneumothorax or pleural effusion. Atherosclerotic calcification noted within the aorta. Mild cardiomegaly is stable when accounting for supine positioning. Pulmonary vascularity is normal. No acute bone abnormality. IMPRESSION: 1. No active disease. 2. Stable cardiomegaly. Electronically Signed   By: Fidela Salisbury M.D.   On: 06/08/2022 03:51    ECHO 05/05/2022   1. The left ventricle is normal in size with mildly increased wall  thickness.   2. The left ventricular systolic function is mildly decreased, LVEF is  visually estimated at 45-50%.    3. There is grade III diastolic dysfunction (severely elevated filling  pressure).   4. The mitral valve leaflets are mildly thickened with normal  leaflet  mobility.   5. There is mild centrally directed mitral regurgitation.    6. The left atrium is mildly dilated in size.    7. The aortic valve is trileaflet with moderately thickened leaflets with  normal excursion.    8. The right ventricle is upper normal in size, with normal systolic  function.   9. There is mild tricuspid regurgitation.    10. There is moderate pulmonary hypertension.    11. The right atrium is moderately dilated in size.    12. IVC size and inspiratory change suggest elevated right atrial pressure.  (10-20 mmHg).   Leane Call 11/2019 Indeterminate Lexiscan infusion EKG  Fixed inferior myocardial perfusion defect consistent previous infarct  and/or scar and minimal reversible lateral myocardial perfusion defect  consistent with possible myocardial ischemia.  Clinical correlation advised   TELEMETRY reviewed by me (LT) 06/11/2022 : sinus brady to NSR 54-60s  EKG reviewed by me: NSR, nonspecific TW abnormality  Data reviewed by me (LT) 06/11/2022: Hospitalist progress note last 24 hours labs, imaging, telemetry vitals I's/O  Principal Problem:   Acute on  chronic systolic CHF (congestive heart failure) (HCC) Active Problems:   Chronic obstructive pulmonary disease, unspecified ([place])   Hypertensive urgency   Peripheral neuropathy   Acute lower UTI   Fall at home, initial encounter   Depression   GERD without esophagitis   Acute respiratory failure with hypoxia (HCC)    ASSESSMENT AND PLAN:  Jessica Howell is an 77yoF with a PMH of HFmrEF (45-50%, g3DD, mild MR, mod pulm HTN 04/2022), HTN, HLD, CAD, paroxysmal AF (declined AC), COPD, hx tobacco use, CKD 3, cognitive impairment who presented to [facility] ED 06/08/22 after a fall at home, was hypoxic when EMS arrived and placed her on 3 L.  BNP was elevated at 1200 concerning for acute on chronic CHF for which cardiology is consulted.  # Cystitis # fall  Imaging without acute fx Management per primary team, s/p aztreonam, cefdinir and fosfomycin x 1   # Acute on chronic HFmrEF (45-50%, G3 DD 04/2022) Presents with an increased oxygen requirement, BNP of 1200, pulmonary vascular congestion on CXR. Patient is a limited historian, unclear what lead to this decompensation. Not grossly hypervolemic on exam. Good UOP after initial diuresis.  -agree with continuing lasix IV 57m x2-3 more doses, discharge on torsemide 24mdaily (prev on torsemide 1058maily) -continue GDMT: metoprolol XL 100m60mily, losartan 50mg60mly, conitnue spiro 25mg 37my. No SGLT2i with ongoing cystitis.  -increase hydralazine 100mg m36mtimes daily -strict I/O -anticipate discharge readiness from a cardiac perspective tomorrow, 2/17  # HTN [facility]qu[facility]. Continue current regimen with the above + amlodipine 10mg da31mand clonidine 0.1mg BID.23m# Moderate pulmonary hypertension # COPD On 3L, no baseline requirement. Continue inhalers per primary + diuresis as above  # CKD 3 Slight improvement with diuresis  # Paroxysmal atrial fibrillation In NSR on tele, not on AC at baseline  with fall history   # demand ischemia  Minimal elevation with flat trend at 33, 34. In absence of chest pain or EKG changes most consistent with demand/supply mismatch and not ACS   This patient's plan of care was discussed and created with Dr. Callwood Clayborn Bignesss in agreement.  Signed: Reymond Maynez MichTristan Schroeder/16/2024, 12:09 PM [facility]

## 2022-06-11 NOTE — Progress Notes (Addendum)
  PROGRESS NOTE    Jessica Howell  [id number] DOB: 1934/06/24 DOA: 06/08/2022 PCP: Glean Hess, MD  258A/258A-AA  LOS: 3 days   Brief hospital course:   Assessment & Plan: Jessica Howell is a 88 y.o. Caucasian female with medical history significant for systolic CHF, COPD, depression, GERD, hypertension, dementia, and atrial fibrillation, who presented to the emergency room with acute onset of a fall at home.   She was having dyspnea when she came to the ER and Pulse oximetry was 86% on room air and 95 to 98% on 3 L of O2 by nasal cannula.    * Acute on chronic systolic CHF (congestive heart failure) ([place]) # Severe pulmonary hypertension   -last Echo in Jan 2024 with LVEF 45-50%.  Current Echo 55-60% with grade II DD, with severe pulm HTN. --cardio consulted --cont IV lasix 40 BID --cont Toprol at decreased 100 mg daily --cont losartan (new) and aldactone --No SGLT2i with ongoing cystitis.  --will discharge on torsemide 78m daily (prev on torsemide 1109mdaily)   Chronic respiratory failure with hypoxia --Per daughter, [facility] so her need for supplemental oxygen is not considered acute now. --cont 3L [place]  Acute lower UTI --started on IV aztreonam and de-escalated to cefdinir --urine cx pos for ESBL E coli --fosfomycin x1 completed tx  Fall at home, initial encounter --PT/OT  Hypertensive urgency - cont current regimen of amlodipine, clonidine, hydralazine, losartan, Toprol and aldactone  GERD without esophagitis - continue PPI therapy.  Depression - cont Lexapro  COPD - continue Pulmicort.  Paroxysmal atrial fibrillation (HCC) - The patient is obviously a fall risk for anticoagulation. --cont Toprol at decreased 100 mg daily  # Demand ischemia --trop 33, 34, flat  CKD3a --Cr stable  Dementia --dx confirmed with daughter   DVT prophylaxis: Lovenox SQ Code Status: Full code  Family Communication:  updated daughter on the phone today Level of care: Telemetry Cardiac Dispo:   The patient is from: ALF Anticipated d/c is to: ALF Anticipated d/c date is: Monday   Subjective and Interval History:  Pt reported still having dyspnea.   Objective: Vitals:   06/11/22 0850 06/11/22 0855 06/11/22 1152 06/11/22 1624  BP:   101/88 (!) 145/55  Pulse:   62 63  Resp:   16 16  Temp:   98.4 F (36.9 C) 98.5 F (36.9 C)  TempSrc:   Oral Oral  SpO2: (!) 86% 93% 98% 97%  Weight:      Height:        Intake/Output Summary (Last 24 hours) at 06/11/2022 1703 Last data filed at 06/11/2022 1037 Gross per 24 hour  Intake 700 ml  Output 1125 ml  Net -425 ml   Filed Weights   06/09/22 0833 06/10/22 1045 06/11/22 0844  Weight: 60.4 kg 59.2 kg 57 kg    Examination:   Constitutional: NAD, AAOx3 HEENT: conjunctivae and lids normal, EOMI CV: No cyanosis.   RESP: normal respiratory effort, on 3L Extremities: No effusions, edema in BLE SKIN: warm, dry Neuro: II - XII grossly intact.     Data Reviewed: I have personally reviewed labs and imaging studies  Time spent: 50 minutes  TiEnzo BiMD [facility] If 7PM-7AM, please contact night-coverage 06/11/2022, 5:03 PM

## 2022-06-11 NOTE — Care Management Important Message (Signed)
Important Message  Patient Details  Name: Jessica Howell MRN: [id number] Date of Birth: 1935/03/05   Medicare Important Message Given:  Yes     Dannette Barbara 06/11/2022, 12:02 PM

## 2022-06-11 NOTE — Progress Notes (Signed)
Occupational Therapy Treatment Patient Details Name: Jessica Howell MRN: [id number] DOB: [birth year] Today's Date: 06/11/2022   History of present illness Patient is a 88 year old female presenting from ALF after sustaining a fall with weakness and shortness of breath. Found to be in congestive heart failure and had abnormal UA.   OT comments  Chart reviewed, pt greeted in bed agreeable to OT tx session. RN ok'd session. Pt is oriented to self and place, increased time for one step direction following. Tx session targeted improving functional activity tolerance in the setting of ADL tasks. Improvements noted in bed mobility with supervision, STS with supervision, amb to bathroom sink with RW with CGA, grooming tasks in standing with CGA. Pt vitals remain stable throughout on 3 L via [place]. Progress is being made towards goals, discharge recommendation remains appropriate. OT will continue to follow acutely.    Recommendations for follow up therapy are one component of a multi-disciplinary discharge planning process, led by the attending physician.  Recommendations may be updated based on patient status, additional functional criteria and insurance authorization.    Follow Up Recommendations  Home health OT     Assistance Recommended at Discharge Frequent or constant Supervision/Assistance  Patient can return home with the following  A little help with walking and/or transfers;A little help with bathing/dressing/bathroom;Assistance with cooking/housework;Direct supervision/assist for financial management;Direct supervision/assist for medications management;Assist for transportation;Help with stairs or ramp for entrance   Equipment Recommendations   (2WW)    Recommendations for Other Services      Precautions / Restrictions Precautions Precautions: Fall Restrictions Weight Bearing Restrictions: No       Mobility Bed Mobility Overal bed mobility: Needs Assistance Bed Mobility: Supine to  Sit, Sit to Supine     Supine to sit: Supervision Sit to supine: Supervision        Transfers Overall transfer level: Needs assistance Equipment used: Rolling walker (2 wheels) Transfers: Sit to/from Stand Sit to Stand: Supervision                 Balance Overall balance assessment: Needs assistance Sitting-balance support: Feet supported Sitting balance-Leahy Scale: Good     Standing balance support: Bilateral upper extremity supported, Reliant on assistive device for balance Standing balance-Leahy Scale: Fair                             ADL either performed or assessed with clinical judgement   ADL Overall ADL's : Needs assistance/impaired Eating/Feeding: Set up;Sitting   Grooming: Standing;Min guard;Wash/dry face;Wash/dry hands;Supervision/safety Grooming Details (indicate cue type and reason): with RW at sink level                             Functional mobility during ADLs: Supervision/safety;Min guard;Rolling walker (2 wheels) (approx 10' 2 attempts in room with standing at the sink in between amb 10' with RW, frequent vcs for safe RW use)      Extremity/Trunk Assessment              Vision       Perception     Praxis      Cognition Arousal/Alertness: Awake/alert Behavior During Therapy: WFL for tasks assessed/performed, Flat affect Overall Cognitive Status: No family/caregiver present to determine baseline cognitive functioning Area of Impairment: Orientation, Attention, Memory, Following commands, Safety/judgement, Awareness, Problem solving  Orientation Level: Disoriented to, Time, Situation Current Attention Level: Sustained Memory: Decreased short-term memory Following Commands: Follows one step commands with increased time Safety/Judgement: Decreased awareness of safety, Decreased awareness of deficits Awareness: Intellectual Problem Solving: Slow processing, Difficulty sequencing, Requires  verbal cues, Requires tactile cues          Exercises      Shoulder Instructions       General Comments vital signs appear stable throughout on 3 L via [place]    Pertinent Vitals/ Pain       Pain Assessment Pain Assessment: No/denies pain  Home Living                                          Prior Functioning/Environment              Frequency  Min 2X/week        Progress Toward Goals  OT Goals(current goals can now be found in the care plan section)  Progress towards OT goals: Progressing toward goals     Plan Discharge plan remains appropriate;Frequency remains appropriate    Co-evaluation                 AM-PAC OT "6 Clicks" Daily Activity     Outcome Measure   Help from another person eating meals?: None Help from another person taking care of personal grooming?: A Little Help from another person toileting, which includes using toliet, bedpan, or urinal?: A Little Help from another person bathing (including washing, rinsing, drying)?: A Lot Help from another person to put on and taking off regular upper body clothing?: A Little Help from another person to put on and taking off regular lower body clothing?: A Little 6 Click Score: 18    End of Session Equipment Utilized During Treatment: Oxygen;Rolling walker (2 wheels)      Activity Tolerance Patient tolerated treatment well   Patient Left with call bell/phone within reach;in bed;with bed alarm set   Nurse Communication Mobility status        Time: 1441-1455 OT Time Calculation (min): 14 min  Charges: OT General Charges $OT Visit: 1 Visit OT Treatments $Self Care/Home Management : 8-22 mins  Shanon Payor, OTD OTR/L  06/11/22, 3:45 PM

## 2022-06-11 NOTE — Progress Notes (Signed)
SATURATION QUALIFICATIONS: (This note is used to comply with regulatory documentation for home oxygen)  Patient Saturations on Room Air at Rest = 89%  Patient Saturations on Room Air while Ambulating = 86%  Patient Saturations on 3 Liters of oxygen while Ambulating = 93%  Please briefly explain why patient needs home oxygen:

## 2022-06-12 DIAGNOSIS — I5023 Acute on chronic systolic (congestive) heart failure: Secondary | ICD-10-CM | POA: Diagnosis not present

## 2022-06-12 LAB — BASIC METABOLIC PANEL
Anion gap: 10 (ref 5–15)
BUN: 20 mg/dL (ref 8–23)
CO2: 25 mmol/L (ref 22–32)
Calcium: 9.8 mg/dL (ref 8.9–10.3)
Chloride: 100 mmol/L (ref 98–111)
Creatinine, Ser: 1.34 mg/dL — ABNORMAL HIGH (ref 0.44–1.00)
GFR, Estimated: 39 mL/min — ABNORMAL LOW (ref >60)
Glucose, Bld: 108 mg/dL — ABNORMAL HIGH (ref 70–99)
Potassium: 3.8 mmol/L (ref 3.5–5.1)
Sodium: 135 mmol/L (ref 135–145)

## 2022-06-12 LAB — CBC
HCT: 37.3 % (ref 36.0–46.0)
Hemoglobin: 11.7 g/dL — ABNORMAL LOW (ref 12.0–15.0)
MCH: 26.4 pg (ref 26.0–34.0)
MCHC: 31.4 g/dL (ref 30.0–36.0)
MCV: 84 fL (ref 80.0–100.0)
Platelets: 162 10*3/uL (ref 150–400)
RBC: 4.44 MIL/uL (ref 3.87–5.11)
RDW: 15.2 % (ref 11.5–15.5)
WBC: 7.9 10*3/uL (ref 4.0–10.5)
nRBC: 0 % (ref 0.0–0.2)

## 2022-06-12 LAB — MAGNESIUM: Magnesium: 2 mg/dL (ref 1.7–2.4)

## 2022-06-12 MED ORDER — LOSARTAN POTASSIUM 50 MG PO TABS
100.0000 mg | ORAL_TABLET | Freq: Every day | ORAL | Status: DC
  Administered 2022-06-12: 100 mg via ORAL
  Filled 2022-06-12: qty 2

## 2022-06-12 MED ORDER — AMLODIPINE BESYLATE 10 MG PO TABS
10.0000 mg | ORAL_TABLET | Freq: Every day | ORAL | Status: DC
  Administered 2022-06-12 – 2022-06-14 (×3): 10 mg via ORAL
  Filled 2022-06-12 (×3): qty 1

## 2022-06-12 NOTE — Progress Notes (Addendum)
  PROGRESS NOTE    Jessica Howell  [id number] DOB: [birth year] DOA: 06/08/2022 PCP: Glean Hess, MD  258A/[id number]-AA  LOS: 4 days   Brief hospital course:   Assessment & Plan: Jessica Howell is a 88 y.o. Caucasian female with medical history significant for systolic CHF, COPD, depression, GERD, hypertension, dementia, and atrial fibrillation, who presented to the emergency room with acute onset of a fall at home.   She was having dyspnea when she came to the ER and Pulse oximetry was 86% on room air and 95 to 98% on 3 L of O2 by nasal cannula.    * Acute on chronic systolic CHF (congestive heart failure) ([place]) # Severe pulmonary hypertension   -last Echo in Jan 2024 with LVEF 45-50%.  Current Echo 55-60% with grade II DD, with severe pulm HTN. --cardio consulted, received IV lasix 40 BID Plan: --hold lasix today due to Cr bump --cont Toprol at decreased 100 mg daily --cont losartan (new) and aldactone --No SGLT2i with ongoing cystitis.  --will discharge on torsemide 63m daily (prev on torsemide 148mdaily)   Chronic respiratory failure with hypoxia --Per daughter, [facility], so her need for supplemental oxygen is now not considered acute. --cont 3L [place]  Acute lower UTI --started on IV aztreonam and de-escalated to cefdinir --urine cx pos for ESBL E coli --fosfomycin x1 completed tx  Fall at home, initial encounter --PT/OT  Hypertensive urgency - BP often very elevated, but intermittently down to 110's --switch amlodipine to nightly --increase losartan to 100 mg daily --cont clonidine, hydralazine, Toprol and aldactone  GERD without esophagitis - continue PPI therapy.  Depression - cont Lexapro  COPD - continue Pulmicort.  Paroxysmal atrial fibrillation (HCC) - The patient is obviously a fall risk for anticoagulation. --cont Toprol at decreased 100 mg daily  # Demand ischemia --trop 33, 34, flat  CKD3a --Cr  stable  Dementia --dx confirmed with daughter  Hypokalemia --monitor and replete PRN   DVT prophylaxis: Lovenox SQ Code Status: Full code  Family Communication:  Level of care: Telemetry Cardiac Dispo:   The patient is from: ALF Anticipated d/c is to: ALF Anticipated d/c date is: Monday (when ALF can take pt back)   Subjective and Interval History:  No change today.   Objective: Vitals:   06/12/22 0729 06/12/22 0752 06/12/22 0941 06/12/22 1127  BP:  (!) 195/66  (!) 119/49  Pulse:  73  63  Resp:  19    Temp:      TempSrc:      SpO2: 99% 96%  98%  Weight:   60.1 kg   Height:        Intake/Output Summary (Last 24 hours) at 06/12/2022 1554 Last data filed at 06/12/2022 0700 Gross per 24 hour  Intake 680 ml  Output 1050 ml  Net -370 ml   Filed Weights   06/10/22 1045 06/11/22 0844 06/12/22 0941  Weight: 59.2 kg 57 kg 60.1 kg    Examination:   Constitutional: NAD, in recliner CV: No cyanosis.   RESP: normal respiratory effort, on 3L   Data Reviewed: I have personally reviewed labs and imaging studies  Time spent: 35 minutes  TiEnzo BiMD [facility] If 7PM-7AM, please contact night-coverage 06/12/2022, 3:54 PM

## 2022-06-12 NOTE — Progress Notes (Signed)
Physical Therapy Treatment Patient Details Name: Jessica Howell MRN: [id number] DOB: [birth year] Today's Date: 06/12/2022   History of Present Illness Patient is a 88 year old female presenting from ALF after sustaining a fall with weakness and shortness of breath. Found to be in congestive heart failure and had abnormal UA.    PT Comments    Awakens easily.  OOB with supervision.  Stands and walks to [facility] with RW and supervision then completes x 1 lap on unit.  Remained up in chair.  Sats 95% after gait on 3 lpm.  Progressing well.  Recommendations for follow up therapy are one component of a multi-disciplinary discharge planning process, led by the attending physician.  Recommendations may be updated based on patient status, additional functional criteria and insurance authorization.  Follow Up Recommendations  Home health PT     Assistance Recommended at Discharge Set up Supervision/Assistance  Patient can return home with the following Help with stairs or ramp for entrance;Assist for transportation;Assistance with cooking/housework;A little help with walking and/or transfers;A little help with bathing/dressing/bathroom   Equipment Recommendations       Recommendations for Other Services       Precautions / Restrictions Precautions Precautions: Fall Restrictions Weight Bearing Restrictions: No     Mobility  Bed Mobility Overal bed mobility: Needs Assistance Bed Mobility: Supine to Sit     Supine to sit: Supervision          Transfers Overall transfer level: Needs assistance Equipment used: Rolling walker (2 wheels) Transfers: Sit to/from Stand Sit to Stand: Supervision                Ambulation/Gait Ambulation/Gait assistance: Min guard, Supervision Gait Distance (Feet): 200 Feet Assistive device: Rolling walker (2 wheels) Gait Pattern/deviations: Step-through pattern Gait velocity: decreased     General Gait Details: 95% on 3 lpm   Stairs              Wheelchair Mobility    Modified Rankin (Stroke Patients Only)       Balance Overall balance assessment: Needs assistance Sitting-balance support: Feet supported Sitting balance-Leahy Scale: Good     Standing balance support: Bilateral upper extremity supported, Reliant on assistive device for balance Standing balance-Leahy Scale: Fair                              Cognition Arousal/Alertness: Awake/alert Behavior During Therapy: WFL for tasks assessed/performed, Flat affect Overall Cognitive Status: Within Functional Limits for tasks assessed                                          Exercises Other Exercises Other Exercises: to [facility] to void    General Comments        Pertinent Vitals/Pain Pain Assessment Pain Assessment: No/denies pain    Home Living                          Prior Function            PT Goals (current goals can now be found in the care plan section) Progress towards PT goals: Progressing toward goals    Frequency    Min 2X/week      PT Plan Current plan remains appropriate    Co-evaluation  AM-PAC PT "6 Clicks" Mobility   Outcome Measure  Help needed turning from your back to your side while in a flat bed without using bedrails?: None Help needed moving from lying on your back to sitting on the side of a flat bed without using bedrails?: None Help needed moving to and from a bed to a chair (including a wheelchair)?: A Little Help needed standing up from a chair using your arms (e.g., wheelchair or bedside chair)?: A Little Help needed to walk in hospital room?: A Little Help needed climbing 3-5 steps with a railing? : A Little 6 Click Score: 20    End of Session Equipment Utilized During Treatment: Gait belt;Oxygen Activity Tolerance: Patient tolerated treatment well Patient left: in chair;with restraints reapplied;with chair alarm set   PT Visit Diagnosis:  Unsteadiness on feet (R26.81);Muscle weakness (generalized) (M62.81)     Time: [id number] PT Time Calculation (min) (ACUTE ONLY): 16 min  Charges:  $Gait Training: 8-22 mins                   Chesley Noon, PTA 06/12/22, 10:42 AM

## 2022-06-12 NOTE — Progress Notes (Signed)
[facility]    SUBJECTIVE: Patient states to be doing reasonably well denies any worsening feels like she will be ready to be discharged home today and follow-up with cardiology as an outpatient still on supplemental oxygen no wheezing   Vitals:   06/12/22 0729 06/12/22 0752 06/12/22 0941 06/12/22 1127  BP:  (!) 195/66  (!) 119/49  Pulse:  73  63  Resp:  19    Temp:      TempSrc:      SpO2: 99% 96%  98%  Weight:   60.1 kg   Height:         Intake/Output Summary (Last 24 hours) at 06/12/2022 1200 Last data filed at 06/12/2022 0700 Gross per 24 hour  Intake 920 ml  Output 1050 ml  Net -130 ml      PHYSICAL EXAM  General: Well developed, well nourished, in no acute distress HEENT:  Normocephalic and atramatic Neck:  No JVD.  Lungs: Clear bilaterally to auscultation and percussion. Heart: HRRR . Normal S1 and S2 without gallops or murmurs.  Abdomen: Bowel sounds are positive, abdomen soft and non-tender  Msk:  Back normal, normal gait. Normal strength and tone for age. Extremities: No clubbing, cyanosis or edema.   Neuro: Alert and oriented X 3. Psych:  Good affect, responds appropriately   LABS: Basic Metabolic Panel: Recent Labs    06/11/22 0214 06/12/22 0424  NA 137 135  K 3.8 3.8  CL 102 100  CO2 26 25  GLUCOSE 99 108*  BUN 14 20  CREATININE 1.10* 1.34*  CALCIUM 9.6 9.8  MG 2.0 2.0   Liver Function Tests: No results for input(s): "AST", "ALT", "ALKPHOS", "BILITOT", "PROT", "ALBUMIN" in the last 72 hours. No results for input(s): "LIPASE", "AMYLASE" in the last 72 hours. CBC: Recent Labs    06/11/22 0214 06/12/22 0424  WBC 8.6 7.9  HGB 11.6* 11.7*  HCT 36.7 37.3  MCV 84.4 84.0  PLT 219 162   Cardiac Enzymes: No results for input(s): "CKTOTAL", "CKMB", "CKMBINDEX", "TROPONINI" in the last 72 hours. BNP: Invalid input(s): "POCBNP" D-Dimer: No results for input(s): "DDIMER" in the last 72 hours. Hemoglobin A1C: No results for input(s):  "HGBA1C" in the last 72 hours. Fasting Lipid Panel: No results for input(s): "CHOL", "HDL", "LDLCALC", "TRIG", "CHOLHDL", "LDLDIRECT" in the last 72 hours. Thyroid Function Tests: No results for input(s): "TSH", "T4TOTAL", "T3FREE", "THYROIDAB" in the last 72 hours.  Invalid input(s): "FREET3" Anemia Panel: No results for input(s): "VITAMINB12", "FOLATE", "FERRITIN", "TIBC", "IRON", "RETICCTPCT" in the last 72 hours.  No results found.   Echo Borderline LVF EF=45%  TELEMETRY: Normal sinus rhythm:  ASSESSMENT AND PLAN:  Principal Problem:   Acute on chronic systolic CHF (congestive heart failure) (HCC) Active Problems:   Chronic obstructive pulmonary disease, unspecified (HCC)   Hypertensive urgency   Peripheral neuropathy   Acute lower UTI   Fall at home, initial encounter   Depression   GERD without esophagitis   Acute respiratory failure with hypoxia (HCC)    Plan Acute on chronic systolic ejection fraction with heart failure previous EF around 45 to 50% with elevated BNP recommend diuresis Lasix beta-blocker Hypertension reasonably controlled continue amlodipine and clonidine therapy as necessary Chronic renal sufficiency stage III maintain adequate perfusion continue diuresis consider follow-up with nephrology Pulm hypertension thought to be moderate continue supplemental oxygen continue inhalers with diuresis follow-up with pulmonary Paroxysmal atrial fibrillation currently in sinus continue rate control with diltiazem anticoagulation with Eliquis Dementia mild continue  current therapy No invasive procedures planned  Yolonda Kida, MD, 06/12/2022 12:00 PM

## 2022-06-13 DIAGNOSIS — I5023 Acute on chronic systolic (congestive) heart failure: Secondary | ICD-10-CM | POA: Diagnosis not present

## 2022-06-13 LAB — BASIC METABOLIC PANEL
Anion gap: 9 (ref 5–15)
BUN: 27 mg/dL — ABNORMAL HIGH (ref 8–23)
CO2: 26 mmol/L (ref 22–32)
Calcium: 9.7 mg/dL (ref 8.9–10.3)
Chloride: 101 mmol/L (ref 98–111)
Creatinine, Ser: 1.54 mg/dL — ABNORMAL HIGH (ref 0.44–1.00)
GFR, Estimated: 33 mL/min — ABNORMAL LOW (ref >60)
Glucose, Bld: 117 mg/dL — ABNORMAL HIGH (ref 70–99)
Potassium: 3.9 mmol/L (ref 3.5–5.1)
Sodium: 136 mmol/L (ref 135–145)

## 2022-06-13 LAB — MAGNESIUM: Magnesium: 2.4 mg/dL (ref 1.7–2.4)

## 2022-06-13 LAB — CBC
HCT: 37.5 % (ref 36.0–46.0)
Hemoglobin: 12 g/dL (ref 12.0–15.0)
MCH: 26.7 pg (ref 26.0–34.0)
MCHC: 32 g/dL (ref 30.0–36.0)
MCV: 83.5 fL (ref 80.0–100.0)
Platelets: 158 10*3/uL (ref 150–400)
RBC: 4.49 MIL/uL (ref 3.87–5.11)
RDW: 15.3 % (ref 11.5–15.5)
WBC: 7.1 10*3/uL (ref 4.0–10.5)
nRBC: 0 % (ref 0.0–0.2)

## 2022-06-13 LAB — CULTURE, BLOOD (SINGLE)
Culture: NO GROWTH
Special Requests: ADEQUATE

## 2022-06-13 MED ORDER — SODIUM CHLORIDE 0.9 % IV SOLN: INTRAVENOUS | Status: DC

## 2022-06-13 MED ORDER — HYDRALAZINE HCL 20 MG/ML IJ SOLN: 10.0000 mg | Freq: Four times a day (QID) | INTRAMUSCULAR | Status: DC | PRN

## 2022-06-13 NOTE — Progress Notes (Addendum)
PROGRESS NOTE    Jessica Howell  [id number] DOB: 04-06-1935 DOA: 06/08/2022 PCP: Glean Hess, MD  [id number]/[id number]-AA  LOS: 5 days   Brief hospital course:   Assessment & Plan: Jessica Howell is a 88 y.o. Caucasian female with medical history significant for systolic CHF, COPD, depression, GERD, hypertension, dementia, and atrial fibrillation, who presented to the emergency room with acute onset of a fall at home.   She was having dyspnea when she came to the ER and Pulse oximetry was 86% on room air and 95 to 98% on 3 L of O2 by nasal cannula.    * Acute on chronic systolic CHF (congestive heart failure) ([place]) # Severe pulmonary hypertension   -last Echo in Jan 2024 with LVEF 45-50%.  Current Echo 55-60% with grade II DD, with severe pulm HTN. --cardio consulted, received IV lasix 40 BID Plan: --hold lasix due to AKI --cont Toprol at decreased 100 mg daily --cont losartan (new) and aldactone --No SGLT2i with ongoing cystitis.  --will discharge on torsemide 63m daily (prev on torsemide 138mdaily)   Chronic respiratory failure with hypoxia --Per daughter, [facility], so her need for supplemental oxygen is now not considered acute. --cont 3L [place]  Acute lower UTI --started on IV aztreonam and de-escalated to cefdinir --urine cx pos for ESBL E coli --fosfomycin x1 completed tx  Fall at home, initial encounter --PT/OT  Hypertensive urgency - BP often very elevated, but intermittently down to 110's --switch amlodipine to nightly --hold losartan due to AKI --cont clonidine, hydralazine, Toprol and aldactone  GERD without esophagitis - continue PPI therapy.  Depression - cont Lexapro  COPD - continue Pulmicort.  Paroxysmal atrial fibrillation (HCC) - The patient is obviously a fall risk for anticoagulation. --cont Toprol at decreased 100 mg daily  # Demand ischemia --trop 33, 34, flat  AKI  CKD3a --Cr increased to  1.54 this morning, likely due to over-diureses --hold diuretic --hold losartan --NS@75$  for 12 hours  Dementia --dx confirmed with daughter  Hypokalemia --monitor and replete PRN   DVT prophylaxis: Lovenox SQ Code Status: Full code  Family Communication:  Level of care: Telemetry Cardiac Dispo:   The patient is from: ALF Anticipated d/c is to: ALF Anticipated d/c date is: Monday (when ALF can take pt back)   Subjective and Interval History:  No new complaint today.  Cr went up this morning.   Objective: Vitals:   06/13/22 0427 06/13/22 0728 06/13/22 1219 06/13/22 1344  BP: (!) 134/53 (!) 156/57 (!) 125/52   Pulse: 65 63 60   Resp: 17 19    Temp: 98.2 F (36.8 C) 98 F (36.7 C)    TempSrc: Oral Oral    SpO2: 99% 96% 96% 98%  Weight:      Height:        Intake/Output Summary (Last 24 hours) at 06/13/2022 1417 Last data filed at 06/13/2022 0929 Gross per 24 hour  Intake 300 ml  Output 250 ml  Net 50 ml   Filed Weights   06/10/22 1045 06/11/22 0844 06/12/22 0941  Weight: 59.2 kg 57 kg 60.1 kg    Examination:   Constitutional: NAD, sleeping but easily arousable HEENT: conjunctivae and lids normal, EOMI CV: No cyanosis.   RESP: normal respiratory effort, on 3L SKIN: warm, dry   Data Reviewed: I have personally reviewed labs and imaging studies  Time spent: 35 minutes  TiEnzo BiMD [facility] If  7PM-7AM, please contact night-coverage 06/13/2022, 2:17 PM

## 2022-06-13 NOTE — Progress Notes (Signed)
Jessica [facility] Cardiology    SUBJECTIVE: Patient sleeping today resting comfortably denies any shortness of breath improved leg swelling will still like to go home soon as possible   Vitals:   06/13/22 0031 06/13/22 0427 06/13/22 0728 06/13/22 1219  BP: (!) 139/57 (!) 134/53 (!) 156/57 (!) 125/52  Pulse: 68 65 63 60  Resp: 20 17 19   $ Temp: 98.2 F (36.8 C) 98.2 F (36.8 C) 98 F (36.7 C)   TempSrc:  Oral Oral   SpO2: 98% 99% 96% 96%  Weight:      Height:         Intake/Output Summary (Last 24 hours) at 06/13/2022 1314 Last data filed at 06/13/2022 0438 Gross per 24 hour  Intake --  Output 250 ml  Net -250 ml      PHYSICAL EXAM  General: Well developed, well nourished, in no acute distress HEENT:  Normocephalic and atramatic Neck:  No JVD.  Lungs: Clear bilaterally to auscultation and percussion. Heart: HRRR . Normal S1 and S2 without gallops or murmurs.  Abdomen: Bowel sounds are positive, abdomen soft and non-tender  Msk:  Back normal, normal gait. Normal strength and tone for age. Extremities: No clubbing, cyanosis or edema.   Neuro: Alert and oriented X 3. Psych:  Good affect, responds appropriately   LABS: Basic Metabolic Panel: Recent Labs    06/12/22 0424 06/13/22 0526  NA 135 136  K 3.8 3.9  CL 100 101  CO2 25 26  GLUCOSE 108* 117*  BUN 20 27*  CREATININE 1.34* 1.54*  CALCIUM 9.8 9.7  MG 2.0 2.4   Liver Function Tests: No results for input(s): "AST", "ALT", "ALKPHOS", "BILITOT", "PROT", "ALBUMIN" in the last 72 hours. No results for input(s): "LIPASE", "AMYLASE" in the last 72 hours. CBC: Recent Labs    06/12/22 0424 06/13/22 0526  WBC 7.9 7.1  HGB 11.7* 12.0  HCT 37.3 37.5  MCV 84.0 83.5  PLT 162 158   Cardiac Enzymes: No results for input(s): "CKTOTAL", "CKMB", "CKMBINDEX", "TROPONINI" in the last 72 hours. BNP: Invalid input(s): "POCBNP" D-Dimer: No results for input(s): "DDIMER" in the last 72 hours. Hemoglobin A1C: No results for  input(s): "HGBA1C" in the last 72 hours. Fasting Lipid Panel: No results for input(s): "CHOL", "HDL", "LDLCALC", "TRIG", "CHOLHDL", "LDLDIRECT" in the last 72 hours. Thyroid Function Tests: No results for input(s): "TSH", "T4TOTAL", "T3FREE", "THYROIDAB" in the last 72 hours.  Invalid input(s): "FREET3" Anemia Panel: No results for input(s): "VITAMINB12", "FOLATE", "FERRITIN", "TIBC", "IRON", "RETICCTPCT" in the last 72 hours.  No results found.   Echo of left ventricular function EF around 55% evidence of pulm hypertension  TELEMETRY: Normal sinus rhythm rate of around 72:  ASSESSMENT AND PLAN:  Principal Problem:   Acute on chronic systolic CHF (congestive heart failure) (HCC) Active Problems:   Chronic obstructive pulmonary disease, unspecified (HCC)   Hypertensive urgency   Peripheral neuropathy   Acute lower UTI   Fall at home, initial encounter   Depression   GERD without esophagitis   Acute respiratory failure with hypoxia ([place])    Plan Acute on chronic diastolic congestive heart failure echocardiogram January 2024 EF around 45 to 50% now somewhat improved EF 55 to [id number] with diastolic dysfunction and pulm hypertension continue current therapy with diuretics Lasix Toprol ARB spironolactone Chronic respiratory failure with hypoxemia continue supplemental oxygen therapy with 3 L O2 continue have patient follow-up with pulmonary Acute urinary tract infection continue antibiotic therapy Hypertensive urgency being treated with amlodipine losartan  Toprol spironolactone Continue PPI therapy for reflux type symptoms Agree with inhalers for COPD type symptoms Paroxysmal atrial fibrillation currently on Toprol for rate control poor anticoagulation candidate because of falls [place] troponins more consistent with demand ischemia Mild renal insufficiency continue renal perfusion limit nephrotoxic drugs Increase activity physical therapy hopefully with ambulation Hopefully can  discharge home soon   Yolonda Kida, MD 06/13/2022 1:14 PM

## 2022-06-14 DIAGNOSIS — I5023 Acute on chronic systolic (congestive) heart failure: Secondary | ICD-10-CM | POA: Diagnosis not present

## 2022-06-14 LAB — MAGNESIUM: Magnesium: 2.2 mg/dL (ref 1.7–2.4)

## 2022-06-14 LAB — BASIC METABOLIC PANEL
Anion gap: 10 (ref 5–15)
BUN: 35 mg/dL — ABNORMAL HIGH (ref 8–23)
CO2: 24 mmol/L (ref 22–32)
Calcium: 9.4 mg/dL (ref 8.9–10.3)
Chloride: 100 mmol/L (ref 98–111)
Creatinine, Ser: 1.91 mg/dL — ABNORMAL HIGH (ref 0.44–1.00)
GFR, Estimated: 25 mL/min — ABNORMAL LOW (ref >60)
Glucose, Bld: 111 mg/dL — ABNORMAL HIGH (ref 70–99)
Potassium: 3.7 mmol/L (ref 3.5–5.1)
Sodium: 134 mmol/L — ABNORMAL LOW (ref 135–145)

## 2022-06-14 LAB — CBC
HCT: 38.1 % (ref 36.0–46.0)
Hemoglobin: 12 g/dL (ref 12.0–15.0)
MCH: 26.4 pg (ref 26.0–34.0)
MCHC: 31.5 g/dL (ref 30.0–36.0)
MCV: 83.7 fL (ref 80.0–100.0)
Platelets: 196 10*3/uL (ref 150–400)
RBC: 4.55 MIL/uL (ref 3.87–5.11)
RDW: 15.1 % (ref 11.5–15.5)
WBC: 6.5 10*3/uL (ref 4.0–10.5)
nRBC: 0 % (ref 0.0–0.2)

## 2022-06-14 MED ORDER — SODIUM CHLORIDE 0.9 % IV SOLN: INTRAVENOUS | Status: AC

## 2022-06-14 NOTE — Care Management Important Message (Signed)
Important Message  Patient Details  Name: Jessica Howell MRN: [id number] Date of Birth: [birth year]   Medicare Important Message Given:  Yes     Dannette Barbara 06/14/2022, 10:20 AM

## 2022-06-14 NOTE — Progress Notes (Signed)
[place] NOTE       Patient ID: Jessica Howell MRN: [id number] DOB/AGE: Jan 19, 1935 88 y.o.  Admit date: 06/08/2022 Referring Physician Dr, Eugenie Norrie Primary Physician Dr. Halina Maidens Primary Cardiologist Dr. Nehemiah Massed Reason for Consultation AoCHF  HPI: [name] is an 57yoF with a PMH of HFmrEF (45-50%, g3DD, mild MR, mod pulm HTN 04/2022), HTN, HLD, CAD, paroxysmal AF (declined AC), COPD, hx tobacco use, CKD 3, cognitive impairment who presented to [facility] ED 06/08/22 after a fall at home, was hypoxic when EMS arrived and placed her on 3 L.  BNP was elevated at 1200 concerning for acute on chronic CHF for which cardiology is consulted.  Interval History:  -No acute events -Renal function continues to worsen, further diuretics and nephrotoxins held -Remains on 3L by [place], per collateral history obtained from the patient's daughter, she is actually on some supplemental oxygen at baseline -Patient admits to pain in her back, otherwise denies chest pain or shortness of breath. Net IO Since Admission: -4,330 mL [06/14/22 1702]    Review of systems complete and found to be negative unless listed above     Past Medical History:  Diagnosis Date   A-fib ([place])    Allergy    Anemia    CHF (congestive heart failure) (HCC)    Chronic anticoagulation, recent, Xarelto  10/18/2019   COPD (chronic obstructive pulmonary disease) (HCC)    Depression    Feeling grief    GERD (gastroesophageal reflux disease)    Hyperlipidemia    Hypertension    Hypertensive urgency 10/18/2019   Hypokalemia 09/06/2016   Last Assessment & Plan:  Formatting of this note is different from the original. Will continue to monitor; ordered labs as below. Lab Results  Component Value Date   K 3.2 (L) 04/12/2016   K 3.5 04/10/2016   K 4.2 04/09/2016   K 4.4 11/21/2013   K 4.4 01/03/2013   Myocardial infarction [facility])    Osteoarthritis    Post-nasal drip 11/10/2015   Formatting of this note might  be different from the original. Overview:   (Working Diagnosis)  Last Assessment & Plan:  Formatting of this note might be different from the original. Continue OTC flonase 2 sprays daily Likely contributing to cough.  Will see if flonase helps with cough and PND   Right wrist pain 06/20/2015   Last Assessment & Plan:  Formatting of this note might be different from the original. Significant edema, and some grip and extension weakness, and persistent severe pain. She was not able to tolerate splint Reviewed xr of her recent ED visit No fractures identified (except old fracture on radial head)  +++ snuff box tenderness persists Referred to [facility] Information provide   Upper GI bleed 10/19/2019   Vitamin D deficiency     Past Surgical History:  Procedure Laterality Date   ABDOMINAL HYSTERECTOMY     BREAST SURGERY     COLONOSCOPY WITH PROPOFOL N/A 10/21/2019   Procedure: COLONOSCOPY WITH PROPOFOL;  Surgeon: Jonathon Bellows, MD;  Location: [facility] ENDOSCOPY;  Service: Gastroenterology;  Laterality: N/A;   ESOPHAGOGASTRODUODENOSCOPY (EGD) WITH PROPOFOL N/A 10/20/2019   Procedure: ESOPHAGOGASTRODUODENOSCOPY (EGD) WITH PROPOFOL;  Surgeon: Lin Landsman, MD;  Location: [facility] ENDOSCOPY;  Service: Gastroenterology;  Laterality: N/A;   FOREARM SURGERY     GIVENS CAPSULE STUDY N/A 10/21/2019   Procedure: GIVENS CAPSULE STUDY;  Surgeon: Jonathon Bellows, MD;  Location: [facility] ENDOSCOPY;  Service: Gastroenterology;  Laterality: N/A;  TREATMENT INTERTROCHANTERIC, PERITROCHANTERIC, OR SUBTROCHANTERIC FEMUR FX; W/INTRAMED IMPLANT W/WO SCREWS Left 01/2020    Medications Prior to Admission  Medication Sig Dispense Refill Last Dose   acetaminophen (TYLENOL) 325 MG tablet Take 325 mg by mouth every 6 (six) hours as needed for mild pain, moderate pain, fever or headache.   unk   albuterol (PROVENTIL) (2.5 MG/3ML) 0.083% nebulizer solution INHALE THE CONTENTS OF 1 VIAL VIA NEBULIZER EVERY 6 HOURS AS NEEDED  FOR SHORTNESS OF BREATH OR FOR WHEEZING 540 mL 0 unk   albuterol (VENTOLIN HFA) 108 (90 Base) MCG/ACT inhaler Inhale 2 puffs into the lungs every 6 (six) hours as needed. 54 g 1 unk   amLODipine (NORVASC) 10 MG tablet TAKE 1 TABLET EVERY DAY 90 tablet 3 06/07/2022 at 0800   budesonide (PULMICORT) 0.25 MG/2ML nebulizer solution Take 0.25 mg by nebulization daily.   06/07/2022 at 0800   cloNIDine (CATAPRES) 0.1 MG tablet TAKE 1 TABLET TWICE DAILY (Patient taking differently: Take 0.1 mg by mouth 3 (three) times daily.) 180 tablet 0 06/07/2022 at 2000   dicyclomine (BENTYL) 10 MG capsule Take 1 capsule (10 mg total) by mouth in the morning, at noon, in the evening, and at bedtime. 360 capsule 1 06/07/2022 at 2000   escitalopram (LEXAPRO) 20 MG tablet TAKE 1 TABLET EVERY DAY 90 tablet 3 06/07/2022 at 0800   gabapentin (NEURONTIN) 100 MG capsule TAKE 1 CAPSULE TWICE DAILY 180 capsule 3 06/07/2022 at 2000   hydrALAZINE (APRESOLINE) 25 MG tablet Take 25 mg by mouth every 8 (eight) hours.   06/07/2022 at 2200   ipratropium-albuterol (DUONEB) 0.5-2.5 (3) MG/3ML SOLN Take 3 mLs by nebulization every 6 (six) hours.   06/08/2022 at 0000   lisinopril (ZESTRIL) 40 MG tablet TAKE 1 TABLET EVERY DAY 90 tablet 3 06/07/2022 at 0800   Melatonin 5 MG CHEW Chew by mouth.    unk   metoprolol (TOPROL-XL) 200 MG 24 hr tablet TAKE 1 TABLET EVERY DAY 90 tablet 3 06/07/2022 at 0800   montelukast (SINGULAIR) 10 MG tablet TAKE 1 TABLET AT BEDTIME 90 tablet 3 06/07/2022 at 2000   nitroGLYCERIN (NITROSTAT) 0.4 MG SL tablet Place 0.4 mg under the tongue every 5 (five) minutes as needed for chest pain.   unk   omeprazole (PRILOSEC) 20 MG capsule TAKE 1 CAPSULE TWICE DAILY BEFORE MEALS 180 capsule 3 06/07/2022 at 2000   spironolactone (ALDACTONE) 25 MG tablet Take 25 mg by mouth daily.   06/07/2022 at 0800   torsemide (DEMADEX) 10 MG tablet Take 1 tablet (10 mg total) by mouth daily. 90 tablet 0 06/07/2022 at 0800   aspirin EC 81 MG tablet Take  81 mg by mouth daily. Swallow whole. (Patient not taking: Reported on 06/08/2022)   Not Taking   budesonide (PULMICORT) 0.5 MG/2ML nebulizer solution Take 2 mLs (0.5 mg total) by nebulization 2 (two) times daily. (Patient not taking: Reported on 06/08/2022) 360 mL 1 Not Taking   CALCIUM PO Take 600 mg by mouth 2 (two) times daily. (Patient not taking: Reported on 06/08/2022)   Not Taking   Cyanocobalamin (VITAMIN B 12 PO) Take 1,000 mcg by mouth daily. (Patient not taking: Reported on 06/08/2022)   Not Taking   ferrous gluconate (FERGON) 324 MG tablet TAKE 1 TABLET EVERY DAY (Patient not taking: Reported on 06/08/2022) 90 tablet 1 Not Taking   fluticasone (FLONASE) 50 MCG/ACT nasal spray Place 2 sprays into both nostrils daily. (Patient not taking: Reported on 06/08/2022)   Not  Taking   Misc Natural Products (FIBER 7 PO) Take by mouth. (Patient not taking: Reported on 06/08/2022)   Not Taking   vitamin C (ASCORBIC ACID) 500 MG tablet Take 500 mg by mouth daily. (Patient not taking: Reported on 06/08/2022)   Not Taking    Social History   Socioeconomic History   Marital status: Widowed    Spouse name: Not on file   Number of children: 1   Years of education: Not on file   Highest education level: Not on file  Occupational History   Occupation: retired  Tobacco Use   Smoking status: Former    Packs/day: 0.50    Years: 45.00    Total pack years: 22.50    Types: Cigarettes    Quit date: 06/10/2004    Years since quitting: 18.0   Smokeless tobacco: Never  Vaping Use   Vaping Use: Never used  Substance and Sexual Activity   Alcohol use: Never   Drug use: Not Currently   Sexual activity: Not Currently  Other Topics Concern   Not on file  Social History Narrative   Living alone. Husband passed away in 2018-11-18. Daughter checking in on her and great grandson checks in also.    Social Determinants of Health   Financial Resource Strain: Low Risk  (12/08/2021)   Overall Financial Resource  Strain (CARDIA)    Difficulty of Paying Living Expenses: Not hard at all  Food Insecurity: No Food Insecurity (06/08/2022)   Hunger Vital Sign    Worried About Running Out of Food in the Last Year: Never true    Ran Out of Food in the Last Year: Never true  Transportation Needs: No Transportation Needs (06/08/2022)   PRAPARE - Hydrologist (Medical): No    Lack of Transportation (Non-Medical): No  Physical Activity: Inactive (09/26/2019)   Exercise Vital Sign    Days of Exercise per Week: 0 days    Minutes of Exercise per Session: 0 min  Stress: Stress Concern Present (09/26/2019)   [place]    Feeling of Stress : Very much  Social Connections: Socially Isolated (12/08/2021)   Social Connection and Isolation Panel [NHANES]    Frequency of Communication with Friends and Family: Never    Frequency of Social Gatherings with Friends and Family: Never    Attends Religious Services: Never    Marine scientist or Organizations: No    Attends Archivist Meetings: Never    Marital Status: Widowed  Intimate Partner Violence: Not At Risk (06/08/2022)   Humiliation, Afraid, Rape, and Kick questionnaire    Fear of Current or Ex-Partner: No    Emotionally Abused: No    Physically Abused: No    Sexually Abused: No    Family History  Problem Relation Age of Onset   Diabetes Mother    Hypertension Mother    Cancer Father        esoph.    COPD Sister    Heart disease Brother    Early death Son       Intake/Output Summary (Last 24 hours) at 06/14/2022 1702 Last data filed at 06/14/2022 1205 Gross per 24 hour  Intake --  Output 150 ml  Net -150 ml     Vitals:   06/14/22 0843 06/14/22 1113 06/14/22 1453 06/14/22 1610  BP:  (!) 167/59 (!) 144/55 (!) 144/55  Pulse: 72 65 66   Resp:  16 17    Temp:  (!) 97.4 F (36.3 C) 98 F (36.7 C)   TempSrc:  Oral Oral   SpO2: 97% 99% 98%    Weight:      Height:        PHYSICAL EXAM General: Elderly and frail-appearing Caucasian female, in no acute distress.  Laying on her left side in bed HEENT:  Normocephalic and atraumatic. Very hard of hearing, reads lips ok.  Neck:  No JVD.  Lungs: Normal respiratory effort on 3 L by nasal cannula.  Bibasilar crackles, some improvement in aeration. Heart: HRRR . Normal S1 and S2 without gallops or murmurs.  Abdomen: Non-distended appearing.  Msk: Generalized weakness Extremities: Warm and well perfused. No clubbing, cyanosis.  No peripheral edema.  Neuro: Awake and alert.  Psych:  Answers simple questions but is a limited historian   Labs: Basic Metabolic Panel: Recent Labs    06/13/22 0526 06/14/22 0609  NA 136 134*  K 3.9 3.7  CL 101 100  CO2 26 24  GLUCOSE 117* 111*  BUN 27* 35*  CREATININE 1.54* 1.91*  CALCIUM 9.7 9.4  MG 2.4 2.2    Liver Function Tests: No results for input(s): "AST", "ALT", "ALKPHOS", "BILITOT", "PROT", "ALBUMIN" in the last 72 hours.  No results for input(s): "LIPASE", "AMYLASE" in the last 72 hours. CBC: Recent Labs    06/13/22 0526 06/14/22 0609  WBC 7.1 6.5  HGB 12.0 12.0  HCT 37.5 38.1  MCV 83.5 83.7  PLT 158 196    Cardiac Enzymes: No results for input(s): "CKTOTAL", "CKMB", "CKMBINDEX", "TROPONINIHS" in the last 72 hours.  BNP: No results for input(s): "BNP" in the last 72 hours.  D-Dimer: No results for input(s): "DDIMER" in the last 72 hours. Hemoglobin A1C: No results for input(s): "HGBA1C" in the last 72 hours. Fasting Lipid Panel: No results for input(s): "CHOL", "HDL", "LDLCALC", "TRIG", "CHOLHDL", "LDLDIRECT" in the last 72 hours. Thyroid Function Tests: No results for input(s): "TSH", "T4TOTAL", "T3FREE", "THYROIDAB" in the last 72 hours.  Invalid input(s): "FREET3" Anemia Panel: No results for input(s): "VITAMINB12", "FOLATE", "FERRITIN", "TIBC", "IRON", "RETICCTPCT" in the last 72 hours.   Radiology:  ECHOCARDIOGRAM COMPLETE  Result Date: 06/09/2022    ECHOCARDIOGRAM REPORT   Patient Name:   MYKALIA SATTERLEE Date of Exam: 06/08/2022 Medical Rec #:  [id number]     Height:       60.0 in Accession #:    [id number]    Weight:       150.4 lb Date of Birth:  1935/01/25     BSA:          1.653 m Patient Age:    61 years      BP:           178/78 mmHg Patient Gender: F             HR:           63 bpm. Exam Location:  [facility] Procedure: 2D Echo, Cardiac Doppler and Color Doppler Indications:     CHF  History:         Patient has no prior history of Echocardiogram examinations.                  CHF, CAD, COPD, Arrythmias:Atrial Fibrillation; Risk                  Factors:Hypertension and Dyslipidemia. Sick sinus syndrome.  Sonographer:     Wenda Low Referring Phys:  [id number] [place] Sharlize Hoar Diagnosing Phys: Yolonda Kida MD IMPRESSIONS  1. Left ventricular ejection fraction, by estimation, is 55 to 60%. The left ventricle has normal function. The left ventricle demonstrates global hypokinesis. There is moderate concentric left ventricular hypertrophy. Left ventricular diastolic parameters are consistent with Grade II diastolic dysfunction (pseudonormalization).  2. Right ventricular systolic function is low normal. The right ventricular size is normal. There is severely elevated pulmonary artery systolic pressure.  3. Left atrial size was mild to moderately dilated.  4. Right atrial size was mildly dilated.  5. The mitral valve is normal in structure. Moderate mitral valve regurgitation.  6. The aortic valve is normal in structure. Aortic valve regurgitation is not visualized. Aortic valve sclerosis is present, with no evidence of aortic valve stenosis. FINDINGS  Left Ventricle: Left ventricular ejection fraction, by estimation, is 55 to 60%. The left ventricle has normal function. The left ventricle demonstrates global hypokinesis. The left ventricular internal cavity size was normal in size. There is moderate  concentric left ventricular hypertrophy. Left ventricular diastolic parameters are consistent with Grade II diastolic dysfunction (pseudonormalization). Right Ventricle: The right ventricular size is normal. No increase in right ventricular wall thickness. Right ventricular systolic function is low normal. There is severely elevated pulmonary artery systolic pressure. The tricuspid regurgitant velocity is 3.86 m/s, and with an assumed right atrial pressure of 15 mmHg, the estimated right ventricular systolic pressure is [id number] mmHg. Left Atrium: Left atrial size was mild to moderately dilated. Right Atrium: Right atrial size was mildly dilated. Pericardium: There is no evidence of pericardial effusion. Mitral Valve: The mitral valve is normal in structure. Moderate mitral valve regurgitation. MV peak gradient, 11.4 mmHg. The mean mitral valve gradient is 2.0 mmHg. Tricuspid Valve: The tricuspid valve is normal in structure. Tricuspid valve regurgitation is mild. Aortic Valve: The aortic valve is normal in structure. Aortic valve regurgitation is not visualized. Aortic valve sclerosis is present, with no evidence of aortic valve stenosis. Aortic valve mean gradient measures 6.0 mmHg. Aortic valve peak gradient measures 13.4 mmHg. Aortic valve area, by VTI measures 2.24 cm. Pulmonic Valve: The pulmonic valve was normal in structure. Pulmonic valve regurgitation is trivial. Aorta: The ascending aorta was not well visualized. IAS/Shunts: No atrial level shunt detected by color flow Doppler.  LEFT VENTRICLE PLAX 2D LVIDd:         4.30 cm   Diastology LVIDs:         2.90 cm   LV e' medial:    8.70 cm/s LV PW:         1.50 cm   LV E/e' medial:  13.8 LV IVS:        1.60 cm   LV e' lateral:   9.36 cm/s LVOT diam:     2.00 cm   LV E/e' lateral: 12.8 LV SV:         99 LV SV Index:   60 LVOT Area:     3.14 cm  RIGHT VENTRICLE RV Basal diam:  3.90 cm RV Mid diam:    3.20 cm RV S prime:     10.90 cm/s TAPSE (M-mode): 2.1 cm LEFT  ATRIUM             Index        RIGHT ATRIUM           Index LA diam:        4.70 cm 2.84 cm/m   RA Area:     22.90  cm LA Vol (A2C):   87.1 ml 52.68 ml/m  RA Volume:   72.50 ml  43.85 ml/m LA Vol (A4C):   97.2 ml 58.79 ml/m LA Biplane Vol: 96.0 ml 58.06 ml/m  AORTIC VALVE                     PULMONIC VALVE AV Area (Vmax):    2.18 cm      PV Vmax:       1.11 m/s AV Area (Vmean):   2.22 cm      PV Peak grad:  4.9 mmHg AV Area (VTI):     2.24 cm AV Vmax:           183.00 cm/s AV Vmean:          115.000 cm/s AV VTI:            0.440 m AV Peak Grad:      13.4 mmHg AV Mean Grad:      6.0 mmHg LVOT Vmax:         127.00 cm/s LVOT Vmean:        81.100 cm/s LVOT VTI:          0.314 m LVOT/AV VTI ratio: 0.71  AORTA Ao Root diam: 3.10 cm MITRAL VALVE                TRICUSPID VALVE MV Area (PHT): 4.60 cm     TR Peak grad:   59.6 mmHg MV Area VTI:   2.43 cm     TR Vmax:        386.00 cm/s MV Peak grad:  11.4 mmHg MV Mean grad:  2.0 mmHg     SHUNTS MV Vmax:       1.69 m/s     Systemic VTI:  0.31 m MV Vmean:      62.5 cm/s    Systemic Diam: 2.00 cm MV Decel Time: 165 msec MV E velocity: 120.00 cm/s MV A velocity: 51.90 cm/s MV E/A ratio:  2.31 Yolonda Kida MD Electronically signed by Yolonda Kida MD Signature Date/Time: 06/09/2022/6:46:54 PM    Final    DG Thoracic Spine 2 View  Result Date: 06/08/2022 CLINICAL DATA:  Back pain following fall EXAM: THORACIC SPINE 2 VIEWS COMPARISON:  Chest radiograph 10/17/2019 FINDINGS: Stable compression deformity of T6 without associated retropulsion. No acute fracture or listhesis of the thoracic spine. Remaining vertebral body height is preserved. Mild endplate changes noted within the midthoracic spine in keeping with changes of mild degenerative disc disease. Paraspinal soft tissues are unremarkable. IMPRESSION: 1. No acute fracture or listhesis. 2. Stable T6 compression deformity. Electronically Signed   By: Fidela Salisbury M.D.   On: 06/08/2022 03:53   DG Chest  Port 1 View  Result Date: 06/08/2022 CLINICAL DATA:  Sepsis, back pain EXAM: PORTABLE CHEST 1 VIEW COMPARISON:  10/17/2019 FINDINGS: The lungs are symmetrically well expanded. Chronic interstitial changes are seen. No confluent pulmonary infiltrate. No pneumothorax or pleural effusion. Atherosclerotic calcification noted within the aorta. Mild cardiomegaly is stable when accounting for supine positioning. Pulmonary vascularity is normal. No acute bone abnormality. IMPRESSION: 1. No active disease. 2. Stable cardiomegaly. Electronically Signed   By: Fidela Salisbury M.D.   On: 06/08/2022 03:51    ECHO 05/05/2022   1. The left ventricle is normal in size with mildly increased wall  thickness.   2. The left ventricular systolic function is mildly decreased, LVEF is  visually estimated at 45-50%.  3. There is grade III diastolic dysfunction (severely elevated filling  pressure).   4. The mitral valve leaflets are mildly thickened with normal leaflet  mobility.   5. There is mild centrally directed mitral regurgitation.    6. The left atrium is mildly dilated in size.    7. The aortic valve is trileaflet with moderately thickened leaflets with  normal excursion.    8. The right ventricle is upper normal in size, with normal systolic  function.   9. There is mild tricuspid regurgitation.    10. There is moderate pulmonary hypertension.    11. The right atrium is moderately dilated in size.    12. IVC size and inspiratory change suggest elevated right atrial pressure.  (10-20 mmHg).   Leane Call 11/2019 Indeterminate Lexiscan infusion EKG  Fixed inferior myocardial perfusion defect consistent previous infarct  and/or scar and minimal reversible lateral myocardial perfusion defect  consistent with possible myocardial ischemia.  Clinical correlation advised   TELEMETRY reviewed by me (LT) 06/14/2022 : sinus brady to NSR 54-60s  EKG reviewed by me: NSR, nonspecific TW abnormality  Data  reviewed by me (LT) 06/14/2022: Hospitalist progress note last 24 hours labs, imaging, telemetry vitals I's/O  Principal Problem:   Acute on chronic systolic CHF (congestive heart failure) (HCC) Active Problems:   Chronic obstructive pulmonary disease, unspecified ([place])   Hypertensive urgency   Peripheral neuropathy   Acute lower UTI   Fall at home, initial encounter   Depression   GERD without esophagitis   Acute respiratory failure with hypoxia (HCC)    ASSESSMENT AND PLAN:  Magdeline Boxberger is an 45yoF with a PMH of HFmrEF (45-50%, g3DD, mild MR, mod pulm HTN 04/2022), HTN, HLD, CAD, paroxysmal AF (declined AC), COPD, hx tobacco use, CKD 3, cognitive impairment who presented to [facility] ED 06/08/22 after a fall at home, was hypoxic when EMS arrived and placed her on 3 L.  BNP was elevated at 1200 concerning for acute on chronic CHF for which cardiology is consulted.  # Cystitis # fall  Imaging without acute fx Management per primary team, s/p aztreonam, cefdinir and fosfomycin x 1   # Acute on chronic HFmrEF (45-50%, G3 DD 04/2022) Presents with an increased oxygen requirement, BNP of 1200, pulmonary vascular congestion on CXR. Patient is a limited historian, unclear what lead to this decompensation. Not grossly hypervolemic on exam. Good UOP after initial diuresis.  -S/p lasix IV x multiple days. Now likely overdiuresed w/ AKI below.  -hold loop diuretics for now. Pending renal function improvement , can d/c on torsemide '10mg'$  daily  -continue GDMT: metoprolol XL '100mg'$  daily.  Hold losartan '50mg'$  & spiro '25mg'$  daily with AKI. No SGLT2i with ongoing cystitis.  -Continue hydralazine '100mg'$  mg 3 times daily -strict I/O  # HTN Requiring multiple agents for control during [facility] admission. Continue current regimen with the above + amlodipine '10mg'$  daily and clonidine 0.'1mg'$  BID.   # Moderate pulmonary hypertension # COPD On 3L, no baseline requirement. Continue inhalers per primary + diuresis as  above  # CKD 3 Worsening with diuresis over the weekend.  Recommend holding further diuretics and nephrotoxins.  # Paroxysmal atrial fibrillation In NSR on tele, not on AC at baseline with fall history   # demand ischemia  Minimal elevation with flat trend at 33, 34. In absence of chest pain or EKG changes most consistent with demand/supply mismatch and not ACS   This patient's plan of care was discussed and created with  Dr. Saralyn Pilar and he is in agreement.  Signed: Tristan Schroeder , PA-C 06/14/2022, 5:02 PM [facility] Card[facility]

## 2022-06-14 NOTE — Progress Notes (Signed)
Occupational Therapy Treatment Patient Details Name: Setareh Loiacono MRN: [id number] DOB: 1934-05-03 Today's Date: 06/14/2022   History of present illness Patient is a 88 year old female presenting from ALF after sustaining a fall with weakness and shortness of breath. Found to be in congestive heart failure and had abnormal UA.   OT comments  Ms. Bohlin demonstrated good progress today. She denied pain today and reported feeling she had more energy than during the last few days. She is able to perform bed mobility, transfers, grooming, dressing, ambulation, without physical assistance and without appearing to fatigue. Her O2 sats remained in the upper 90s on 3L O2. Continue to recommend DC back to her ALF with HHOT.   Recommendations for follow up therapy are one component of a multi-disciplinary discharge planning process, led by the attending physician.  Recommendations may be updated based on patient status, additional functional criteria and insurance authorization.    Follow Up Recommendations  Home health OT     Assistance Recommended at Discharge    Patient can return home with the following  A little help with walking and/or transfers;A little help with bathing/dressing/bathroom;Assistance with cooking/housework;Direct supervision/assist for financial management;Direct supervision/assist for medications management;Assist for transportation;Help with stairs or ramp for entrance   Equipment Recommendations       Recommendations for Other Services      Precautions / Restrictions Precautions Precautions: Fall Restrictions Weight Bearing Restrictions: No       Mobility Bed Mobility Overal bed mobility: Needs Assistance Bed Mobility: Supine to Sit     Supine to sit: Supervision     General bed mobility comments: increased time and effort, no physical assist required, no use of bed rails or other bed features    Transfers Overall transfer level: Needs  assistance Equipment used: Rolling walker (2 wheels) Transfers: Sit to/from Stand, Bed to chair/wheelchair/BSC Sit to Stand: Supervision     Step pivot transfers: Supervision           Balance Overall balance assessment: Needs assistance Sitting-balance support: Feet supported Sitting balance-Leahy Scale: Good     Standing balance support: Bilateral upper extremity supported, Reliant on assistive device for balance, During functional activity Standing balance-Leahy Scale: Fair                             ADL either performed or assessed with clinical judgement   ADL Overall ADL's : Needs assistance/impaired Eating/Feeding: Modified independent;Sitting   Grooming: Sitting;Brushing hair;Modified independent               Lower Body Dressing: Sitting/lateral leans;Supervision/safety                      Extremity/Trunk Assessment Upper Extremity Assessment Upper Extremity Assessment: Generalized weakness   Lower Extremity Assessment Lower Extremity Assessment: Generalized weakness        Vision       Perception     Praxis      Cognition Arousal/Alertness: Awake/alert Behavior During Therapy: WFL for tasks assessed/performed, Flat affect Overall Cognitive Status: Within Functional Limits for tasks assessed                                 General Comments: patient is able to follow all directions without difficulty. she is oriented to person, situation        Exercises  Shoulder Instructions       General Comments O2 in upper 90s throughout session on 3L    Pertinent Vitals/ Pain       Pain Assessment Pain Assessment: No/denies pain  Home Living                                          Prior Functioning/Environment              Frequency  Min 2X/week        Progress Toward Goals  OT Goals(current goals can now be found in the care plan section)  Progress towards OT goals:  Progressing toward goals  Acute Rehab OT Goals OT Goal Formulation: With patient/family Time For Goal Achievement: 06/22/22 Potential to Achieve Goals: Good  Plan Discharge plan remains appropriate;Frequency remains appropriate    Co-evaluation                 AM-PAC OT "6 Clicks" Daily Activity     Outcome Measure   Help from another person eating meals?: None Help from another person taking care of personal grooming?: A Little Help from another person toileting, which includes using toliet, bedpan, or urinal?: A Little Help from another person bathing (including washing, rinsing, drying)?: A Lot Help from another person to put on and taking off regular upper body clothing?: A Little Help from another person to put on and taking off regular lower body clothing?: A Little 6 Click Score: 18    End of Session Equipment Utilized During Treatment: Oxygen;Rolling walker (2 wheels)  OT Visit Diagnosis: Unsteadiness on feet (R26.81);Muscle weakness (generalized) (M62.81)   Activity Tolerance Patient tolerated treatment well   Patient Left in chair;with call bell/phone within reach;with nursing/sitter in room   Nurse Communication          Time: [id number] OT Time Calculation (min): 11 min  Charges: OT General Charges $OT Visit: 1 Visit OT Treatments $Self Care/Home Management : 8-22 mins Josiah Lobo, PhD, MS, OTR/L 06/14/22, 12:54 PM

## 2022-06-14 NOTE — Progress Notes (Signed)
PROGRESS NOTE    Jessica Howell  [id number] DOB: Dec 02, 1934 DOA: 06/08/2022 PCP: Glean Hess, MD  [id number]-[id number]  LOS: 6 days   Brief hospital course:   Assessment & Plan: Jessica Howell is a 88 y.o. Caucasian female with medical history significant for systolic CHF, COPD, depression, GERD, hypertension, dementia, and atrial fibrillation, who presented to the emergency room with acute onset of a fall at home.   She was having dyspnea when she came to the ER and Pulse oximetry was 86% on room air and 95 to 98% on 3 L of O2 by nasal cannula.    * Acute on chronic systolic CHF (congestive heart failure) ([place]) # Severe pulmonary hypertension   -last Echo in Jan 2024 with LVEF 45-50%.  Current Echo 55-60% with grade II DD, with severe pulm HTN. --cardio consulted, received IV lasix 40 BID.  Diuretic held since 2/18 due to AKI. Plan: --cont to hold diuretic  --cont Toprol at decreased 100 mg daily --cont losartan (new) and aldactone --No SGLT2i with ongoing cystitis.  --per cardio, will discharge on torsemide 25m daily (prev on torsemide 170mdaily)   Chronic respiratory failure with hypoxia --[facility], so her need for supplemental oxygen is now not considered acute. --cont 3L [place]  Acute lower UTI --started on IV aztreonam and de-escalated to cefdinir --urine cx pos for ESBL E coli --fosfomycin x1 completed tx  Fall at home, initial encounter --PT/OT  Hypertensive urgency - BP often very elevated, but intermittently down to 110's --switch amlodipine to nightly --hold diuretic, home aldactone and losartan due to AKI --cont clonidine, hydralazine, Toprol   GERD without esophagitis - continue PPI therapy.  Depression - cont Lexapro  COPD - continue Pulmicort.  Paroxysmal atrial fibrillation (HCC) - The patient is obviously a fall risk for anticoagulation. --cont Toprol at decreased 100 mg daily  # Demand  ischemia --trop 33, 34, flat  AKI  CKD3a --Cr increased to 1.91 this morning, likely due to over-diureses --hold diuretic --hold losartan and aldactone --cont NS@75$   Dementia --dx confirmed with daughter  Hypokalemia --monitor and replete PRN   DVT prophylaxis: Lovenox SQ Code Status: Full code  Family Communication:  Level of care: Telemetry Cardiac Dispo:   The patient is from: ALF Anticipated d/c is to: ALF Anticipated d/c date is: 1-2 days   Subjective and Interval History:  Pt denied dyspnea, said she did eat part of her breakfast.  AKI worsening, so can not discharge today.   Objective: Vitals:   06/14/22 0843 06/14/22 1113 06/14/22 1453 06/14/22 1610  BP:  (!) 167/59 (!) 144/55 (!) 144/55  Pulse: 72 65 66   Resp: 16 17    Temp:  (!) 97.4 F (36.3 C) 98 F (36.7 C)   TempSrc:  Oral Oral   SpO2: 97% 99% 98%   Weight:      Height:        Intake/Output Summary (Last 24 hours) at 06/14/2022 1706 Last data filed at 06/14/2022 1205 Gross per 24 hour  Intake --  Output 150 ml  Net -150 ml   Filed Weights   06/10/22 1045 06/11/22 0844 06/12/22 0941  Weight: 59.2 kg 57 kg 60.1 kg    Examination:   Constitutional: NAD, alert, not oriented HEENT: conjunctivae and lids normal, EOMI CV: No cyanosis.   RESP: normal respiratory effort, on 3L Extremities: No effusions, edema in BLE SKIN: warm, dry Neuro: II -  XII grossly intact.     Data Reviewed: I have personally reviewed labs and imaging studies  Time spent: 35 minutes  Enzo Bi, MD [facility] If 7PM-7AM, please contact night-coverage 06/14/2022, 5:06 PM

## 2022-06-14 NOTE — TOC Progression Note (Signed)
Transition of Care [facility]) - Progression Note    Patient Details  Name: Jessica Howell MRN: [id number] Date of Birth: 20-Sep-1934  Transition of Care [facility]) CM/SW [place], [place] Phone Number: 06/14/2022, 2:50 PM  Clinical Narrative:     CSW has called main number at [facility] and updated carolin that patient will be discharged tomorrow per MD.   Patient will need new fl2 at dc for Assisted Living. Will need O2 order for continuous or PRN O2 needs at dc.    Please fax dc summary, [place] orders and O2 orders to:[phone] at discharge.     Expected Discharge Plan:  [facility]) Barriers to Discharge: Continued Medical Work up  Expected Discharge Plan and Services       Living arrangements for the past 2 months:  [facility])                                       Social Determinants of Health (SDOH) Interventions [place]: No Food Insecurity (06/08/2022)  Housing: Low Risk  (06/08/2022)  Transportation Needs: No Transportation Needs (06/08/2022)  Utilities: Not At Risk (06/08/2022)  Alcohol Screen: Low Risk  (12/08/2021)  Depression (PHQ2-9): High Risk (04/06/2022)  Financial Resource Strain: Low Risk  (12/08/2021)  Physical Activity: Inactive (09/26/2019)  Social Connections: Socially Isolated (12/08/2021)  Stress: Stress Concern Present (09/26/2019)  Tobacco Use: Medium Risk (06/08/2022)    Readmission Risk Interventions     No data to display

## 2022-06-15 ENCOUNTER — Encounter: Payer: Medicare HMO | Admitting: Family

## 2022-06-15 DIAGNOSIS — I5023 Acute on chronic systolic (congestive) heart failure: Secondary | ICD-10-CM | POA: Diagnosis not present

## 2022-06-15 LAB — MAGNESIUM: Magnesium: 2.3 mg/dL (ref 1.7–2.4)

## 2022-06-15 LAB — CBC
HCT: 38.3 % (ref 36.0–46.0)
Hemoglobin: 12.1 g/dL (ref 12.0–15.0)
MCH: 26.7 pg (ref 26.0–34.0)
MCHC: 31.6 g/dL (ref 30.0–36.0)
MCV: 84.4 fL (ref 80.0–100.0)
Platelets: 266 10*3/uL (ref 150–400)
RBC: 4.54 MIL/uL (ref 3.87–5.11)
RDW: 14.8 % (ref 11.5–15.5)
WBC: 7.3 10*3/uL (ref 4.0–10.5)
nRBC: 0 % (ref 0.0–0.2)

## 2022-06-15 LAB — BASIC METABOLIC PANEL
Anion gap: 7 (ref 5–15)
BUN: 33 mg/dL — ABNORMAL HIGH (ref 8–23)
CO2: 25 mmol/L (ref 22–32)
Calcium: 9.8 mg/dL (ref 8.9–10.3)
Chloride: 101 mmol/L (ref 98–111)
Creatinine, Ser: 1.44 mg/dL — ABNORMAL HIGH (ref 0.44–1.00)
GFR, Estimated: 36 mL/min — ABNORMAL LOW (ref >60)
Glucose, Bld: 110 mg/dL — ABNORMAL HIGH (ref 70–99)
Potassium: 3.5 mmol/L (ref 3.5–5.1)
Sodium: 133 mmol/L — ABNORMAL LOW (ref 135–145)

## 2022-06-15 MED ORDER — METOPROLOL SUCCINATE ER 100 MG PO TB24: 100.0000 mg | ORAL_TABLET | Freq: Every day | ORAL | 2 refills | Status: DC

## 2022-06-15 MED ORDER — MELATONIN 5 MG PO CHEW: 1.0000 | CHEWABLE_TABLET | Freq: Every evening | ORAL | Status: DC | PRN

## 2022-06-15 MED ORDER — SPIRONOLACTONE 25 MG PO TABS: ORAL_TABLET | ORAL | Status: DC

## 2022-06-15 MED ORDER — HYDRALAZINE HCL 100 MG PO TABS: 100.0000 mg | ORAL_TABLET | Freq: Three times a day (TID) | ORAL | 2 refills | Status: AC

## 2022-06-15 MED ORDER — TORSEMIDE 10 MG PO TABS: ORAL_TABLET | ORAL | 0 refills | Status: DC

## 2022-06-15 MED ORDER — AMLODIPINE BESYLATE 10 MG PO TABS: 10.0000 mg | ORAL_TABLET | Freq: Every day | ORAL | Status: DC

## 2022-06-15 MED ORDER — HYDRALAZINE HCL 100 MG PO TABS: 100.0000 mg | ORAL_TABLET | Freq: Three times a day (TID) | ORAL | 2 refills | Status: DC

## 2022-06-15 MED ORDER — ALBUTEROL SULFATE HFA 108 (90 BASE) MCG/ACT IN AERS: 2.0000 | INHALATION_SPRAY | Freq: Four times a day (QID) | RESPIRATORY_TRACT | Status: DC | PRN

## 2022-06-15 MED ORDER — METOPROLOL SUCCINATE ER 100 MG PO TB24: 100.0000 mg | ORAL_TABLET | Freq: Every day | ORAL | 2 refills | Status: AC

## 2022-06-15 NOTE — Progress Notes (Addendum)
SATURATION QUALIFICATIONS: (This note is used to comply with regulatory documentation for home oxygen)  Patient Saturations on Room Air at Rest = 88%  Patient Saturations on Room Air while Ambulating = 86%  Patient Saturations on 3 Liters of oxygen while Ambulating = 95%  Please briefly explain why patient needs home oxygen: To maintain sats >88%

## 2022-06-15 NOTE — NC FL2 (Addendum)
[place] LEVEL OF CARE FORM     IDENTIFICATION  Patient Name: Jessica Howell Birthdate: 17-Feb-1935 Sex: female Admission Date (Current Location): 06/08/2022  [facility] [place] Number:  Engineering geologist and Address:  [facility], [address], [place], [place] [address]      Provider Number: [id number]  Attending Physician Name and Address:  Enzo Bi, MD  Relative Name and Phone Number:       Current Level of Care: Hospital Recommended Level of Care: [place] (with PT and OT through [facility]) Prior Approval Number:    Date Approved/Denied:   PASRR Number:    Discharge Plan: Other (Comment) (ALF with PT and OT through [facility])    Current Diagnoses: Patient Active Problem List   Diagnosis Date Noted   Acute on chronic systolic CHF (congestive heart failure) ([place]) 06/08/2022   Acute lower UTI 06/08/2022   Fall at home, initial encounter 06/08/2022   Depression 06/08/2022   GERD without esophagitis 06/08/2022   Acute respiratory failure with hypoxia ([place]) 06/08/2022   Closed bilateral fracture of pubic rami ([place]) 10/14/2021   Sick sinus syndrome ([place]) 07/16/2020   Alzheimer disease ([place]) 02/22/2020   Closed fracture of left hip ([place]) 02/14/2020   Peripheral neuropathy 11/05/2019   Aortic atherosclerosis ([place]) 11/05/2019   Chronic diastolic CHF (congestive heart failure) ([place]) 10/18/2019   Hypertensive urgency 10/18/2019   Hyponatremia 10/18/2019   Pulmonary nodule 1 cm or greater in diameter 10/18/2019   Thyroid nodule greater than or equal to 1.5 cm in diameter incidentally noted on imaging study 10/18/2019   CKD (chronic kidney disease) stage 3, GFR 30-59 ml/min (HCC) 10/18/2019   Microcytic anemia 10/16/2019   Senile osteoporosis 06/11/2019   Current severe episode of major depressive disorder without psychotic features ([place]) 06/11/2019   Myalgia due to statin 06/11/2019   Esophageal dysphagia  05/03/2018   Iron deficiency anemia 03/18/2018   Other age-related cataract 05/31/2016   Paroxysmal atrial fibrillation ([place]) 04/15/2016   Other seborrheic keratosis 01/03/2013   Coronary atherosclerosis of native coronary artery 01/03/2013   Vitamin D deficiency 12/19/2012   Mixed hyperlipidemia 12/19/2012   Essential (primary) hypertension 12/19/2012   Chronic obstructive pulmonary disease, unspecified ([place]) 12/19/2012   Age-related osteoporosis with current pathological fracture with routine healing 12/19/2012    Orientation RESPIRATION BLADDER Height & Weight     Self, Situation  O2 (Nasal Cannula 3 L) Incontinent Weight: 132 lb (59.9 kg) Height:  5' (152.4 cm)  BEHAVIORAL SYMPTOMS/MOOD NEUROLOGICAL BOWEL NUTRITION STATUS   (None)  (None) Incontinent Diet (Low Sodium) Mechanical soft  AMBULATORY STATUS COMMUNICATION OF NEEDS Skin   Supervision Verbally Bruising                       Personal Care Assistance Level of Assistance  Bathing, Feeding, Dressing Bathing Assistance: Limited assistance Feeding assistance: Independent (Modified) Dressing Assistance: Limited assistance     Functional Limitations Info  Sight, Hearing, Speech Sight Info: Adequate Hearing Info: Adequate Speech Info: Adequate    SPECIAL CARE FACTORS FREQUENCY  PT (By licensed PT), OT (By licensed OT)     PT Frequency: 3 x week OT Frequency: 3 x week            Contractures Contractures Info: Not present    Additional Factors Info  Code Status, Allergies Code Status Info: Full code Allergies Info: Penicillins, Azithromycin, Gemfibrozil, Solifenacin, Statins  Current Medications (06/15/2022):  This is the current hospital active medication list Current Facility-Administered Medications  Medication Dose Route Frequency Provider Last Rate Last Admin   acetaminophen (TYLENOL) tablet 650 mg  650 mg Oral Q6H PRN Mansy, Jan A, MD   650 mg at 06/13/22 [id number]   Or   acetaminophen  (TYLENOL) suppository 650 mg  650 mg Rectal Q6H PRN Mansy, Jan A, MD       amLODipine (NORVASC) tablet 10 mg  10 mg Oral QHS Enzo Bi, MD   10 mg at 06/14/22 2100   ascorbic acid (VITAMIN C) tablet 500 mg  500 mg Oral Daily Mansy, Jan A, MD   500 mg at 06/15/22 0941   aspirin EC tablet 81 mg  81 mg Oral Daily Mansy, Jan A, MD   81 mg at 06/15/22 0941   budesonide (PULMICORT) nebulizer solution 0.5 mg  0.5 mg Nebulization BID Mansy, Jan A, MD   0.5 mg at 06/15/22 0916   cloNIDine (CATAPRES) tablet 0.1 mg  0.1 mg Oral BID Mansy, Jan A, MD   0.1 mg at 06/15/22 0941   cyanocobalamin (VITAMIN B12) tablet 1,000 mcg  1,000 mcg Oral Daily Mansy, Jan A, MD   1,000 mcg at 06/15/22 0941   dicyclomine (BENTYL) capsule 10 mg  10 mg Oral TID AC & HS Mansy, Jan A, MD   10 mg at 06/15/22 0943   enoxaparin (LOVENOX) injection 30 mg  30 mg Subcutaneous Q24H Mansy, Jan A, MD   30 mg at 06/15/22 0942   escitalopram (LEXAPRO) tablet 20 mg  20 mg Oral Daily Mansy, Jan A, MD   20 mg at 06/15/22 0941   ferrous gluconate (FERGON) tablet 324 mg  324 mg Oral Daily Mansy, Jan A, MD   324 mg at 06/15/22 0942   fluticasone (FLONASE) 50 MCG/ACT nasal spray 2 spray  2 spray Each Nare Daily Mansy, Jan A, MD   2 spray at 06/15/22 0946   hydrALAZINE (APRESOLINE) injection 10 mg  10 mg Intravenous Q6H PRN Enzo Bi, MD       hydrALAZINE (APRESOLINE) tablet 100 mg  100 mg Oral TID Enzo Bi, MD   100 mg at 06/15/22 0941   ipratropium-albuterol (DUONEB) 0.5-2.5 (3) MG/3ML nebulizer solution 3 mL  3 mL Nebulization Q4H PRN Mansy, Jan A, MD       magnesium hydroxide (MILK OF MAGNESIA) suspension 30 mL  30 mL Oral Daily PRN Mansy, Jan A, MD       melatonin tablet 5 mg  5 mg Oral QHS PRN Mansy, Jan A, MD       metoprolol succinate (TOPROL-XL) 24 hr tablet 100 mg  100 mg Oral Daily Tristan Schroeder, PA-C   100 mg at 06/15/22 0941   montelukast (SINGULAIR) tablet 10 mg  10 mg Oral QHS Mansy, Jan A, MD   10 mg at 06/14/22 2100    ondansetron (ZOFRAN) tablet 4 mg  4 mg Oral Q6H PRN Mansy, Jan A, MD       Or   ondansetron [facility]) injection 4 mg  4 mg Intravenous Q6H PRN Mansy, Jan A, MD       Oral care mouth rinse  15 mL Mouth Rinse PRN Mansy, Jan A, MD       pantoprazole (PROTONIX) EC tablet 40 mg  40 mg Oral Daily Mansy, Jan A, MD   40 mg at 06/15/22 0941   traZODone (DESYREL) tablet 25 mg  25 mg Oral QHS  PRN Mansy, Arvella Merles, MD   25 mg at 06/12/22 2101     Discharge Medications: STOP taking these medications     ascorbic acid 500 MG tablet Commonly known as: VITAMIN C    aspirin EC 81 MG tablet    CALCIUM PO    ferrous gluconate 324 MG tablet Commonly known as: FERGON    FIBER 7 PO    fluticasone 50 MCG/ACT nasal spray Commonly known as: FLONASE    lisinopril 40 MG tablet Commonly known as: ZESTRIL    nitroGLYCERIN 0.4 MG SL tablet Commonly known as: NITROSTAT    VITAMIN B 12 PO           TAKE these medications     acetaminophen 325 MG tablet Commonly known as: TYLENOL Take 325 mg by mouth every 6 (six) hours as needed for mild pain, moderate pain, fever or headache.    albuterol (2.5 MG/3ML) 0.083% nebulizer solution Commonly known as: PROVENTIL INHALE THE CONTENTS OF 1 VIAL VIA NEBULIZER EVERY 6 HOURS AS NEEDED FOR SHORTNESS OF BREATH OR FOR WHEEZING What changed: Another medication with the same name was changed. Make sure you understand how and when to take each.    albuterol 108 (90 Base) MCG/ACT inhaler Commonly known as: Ventolin HFA Inhale 2 puffs into the lungs every 6 (six) hours as needed for wheezing or shortness of breath. What changed: reasons to take this    amLODipine 10 MG tablet Commonly known as: NORVASC Take 1 tablet (10 mg total) by mouth at bedtime. Home med, but change the time of administration to nightly. What changed:  when to take this additional instructions    budesonide 0.25 MG/2ML nebulizer solution Commonly known as: PULMICORT Take 0.25 mg by  nebulization daily. What changed: Another medication with the same name was removed. Continue taking this medication, and follow the directions you see here.    cloNIDine 0.1 MG tablet Commonly known as: CATAPRES TAKE 1 TABLET TWICE DAILY What changed: when to take this    dicyclomine 10 MG capsule Commonly known as: BENTYL Take 1 capsule (10 mg total) by mouth in the morning, at noon, in the evening, and at bedtime.    escitalopram 20 MG tablet Commonly known as: LEXAPRO TAKE 1 TABLET EVERY DAY    gabapentin 100 MG capsule Commonly known as: NEURONTIN TAKE 1 CAPSULE TWICE DAILY    hydrALAZINE 100 MG tablet Commonly known as: APRESOLINE Take 1 tablet (100 mg total) by mouth 3 (three) times daily. What changed:  medication strength how much to take when to take this    ipratropium-albuterol 0.5-2.5 (3) MG/3ML Soln Commonly known as: DUONEB Take 3 mLs by nebulization every 6 (six) hours.    Melatonin 5 MG Chew Chew 1 tablet by mouth at bedtime as needed (for sleep). What changed:  how much to take when to take this reasons to take this    metoprolol succinate 100 MG 24 hr tablet Commonly known as: TOPROL-XL Take 1 tablet (100 mg total) by mouth daily. Take with or immediately following a meal. What changed:  medication strength how much to take additional instructions    montelukast 10 MG tablet Commonly known as: SINGULAIR TAKE 1 TABLET AT BEDTIME    omeprazole 20 MG capsule Commonly known as: PRILOSEC TAKE 1 CAPSULE TWICE DAILY BEFORE MEALS    spironolactone 25 MG tablet Commonly known as: ALDACTONE Hold until outpatient followup with cardiology due to acute kidney injury. What changed:  how much  to take how to take this when to take this additional instructions    torsemide 10 MG tablet Commonly known as: [place] until outpatient followup with cardiology due to acute kidney injury. What changed:  how much to take how to take this when to  take this additional instructions    Relevant Imaging Results:  Relevant Lab Results:   Additional Information SS#: [id number]  Candie Chroman, LCSW

## 2022-06-15 NOTE — Discharge Summary (Addendum)
Physician Discharge Summary   Jessica Howell  female DOB: 02/17/1935  [id number]  PCP: Glean Hess, MD  Admit date: 06/08/2022 Discharge date: 06/15/2022  Admitted From: ALF Disposition:  ALF Home Health: Yes CODE STATUS: Full code   Hospital Course:  For full details, please see H&P, progress notes, consult notes and ancillary notes.  Briefly,  Jessica Howell is a 88 y.o. Caucasian female with medical history significant for systolic CHF, COPD, hypertension, dementia, and atrial fibrillation, who presented to the emergency room with acute onset of a fall at home.    She was having dyspnea when she came to the ER and Pulse oximetry was 86% on room air and 95 to 98% on 3 L of O2 by nasal cannula.    * Acute on chronic systolic CHF (congestive heart failure) ([place]) # Severe pulmonary hypertension   -last Echo in Jan 2024 with LVEF 45-50%.  Current Echo 55-60% with grade II DD, with severe pulm HTN. --cardio consulted, received IV lasix 40 BID.  Diuretic held since 2/18 due to AKI. --hold home torsemide and aldactone at discharge due to AKI pending outpatient cardio followup in 1 week. --cont Toprol at decreased 100 mg daily (down from 200 mg daily) --home Lisinopril changed to losartan by cardio, which then was later held due to AKI. --No SGLT2i with ongoing cystitis.  --outpatient cardio f/u 1 week after discharge.   Chronic respiratory failure with hypoxia COPD --Per daughter, pt was discharged from [facility] about 2 weeks ago on 3L O2, so her need for supplemental oxygen is now not considered acute. --cont 3L [place] --cont home pulmicort neb and albuterol PRN    Acute lower UTI --started on IV aztreonam and de-escalated to cefdinir --urine cx pos for ESBL E coli --fosfomycin x1 completed tx  Hypertensive urgency - BP labile, sometimes elevated to 180's (tends to be mornings), but intermittently down to 110's, therefore can not just increase BP medication dose based on the  highs. --switch home amlodipine 10 mg to nightly --hold home torsemide and aldactone due to AKI --home Lisinopril changed to losartan by cardio, which then was later held due to AKI. --home hydralazine increased from 25 mg to 100 mg TID --home Toprol decreased from 200 mg to 100 mg daily --cont clonidine 0.1 mg BID  AKI  CKD3a --Cr peaked at 1.91, likely due to over-diureses.  Cr improved to 1.44 prior to discharge after gentle IVF. --hold home torsemide, aldactone pending outpatient cardio f/u.   Fall at home, initial encounter --PT/OT   GERD without esophagitis - continue PPI therapy.   Depression - cont Lexapro   Paroxysmal atrial fibrillation (HCC) - The patient is obviously a fall risk for anticoagulation. --cont Toprol at decreased 100 mg daily (due to bradycardia)   # Demand ischemia --trop 33, 34, flat   Dementia --dx confirmed with daughter   Hypokalemia --monitored and repleted PRN   Unless noted above, medications under "STOP" list are ones pt was not taking PTA.  Discharge Diagnoses:  Principal Problem:   Acute on chronic systolic CHF (congestive heart failure) (HCC) Active Problems:   Acute lower UTI   Fall at home, initial encounter   Hypertensive urgency   Chronic obstructive pulmonary disease, unspecified ([place])   Peripheral neuropathy   Depression   GERD without esophagitis   Acute respiratory failure with hypoxia ([place])   30 Day Unplanned Readmission Risk Score    Flowsheet Row ED to Hosp-Admission (Current) from 06/08/2022 in [facility]  CARDIAC MED PCU  30 Day Unplanned Readmission Risk Score (%) 20.35 Filed at 06/15/2022 0801       This score is the patient's risk of an unplanned readmission within 30 days of being discharged (0 -100%). The score is based on dignosis, age, lab data, medications, orders, and past utilization.   Low:  0-14.9   Medium: 15-21.9   High: 22-29.9   Extreme: 30 and above         Discharge  Instructions:  Allergies as of 06/15/2022       Reactions   Penicillins Anaphylaxis, Rash, Other (See Comments)   Tolerated 5 day course of cefdinir at [facility] 04/2022   Azithromycin Other (See Comments)   abd pain   Gemfibrozil Nausea And Vomiting, Rash      Solifenacin    Mild urinary retention   Statins Nausea And Vomiting, Rash   Stomach pain        Medication List     STOP taking these medications    ascorbic acid 500 MG tablet Commonly known as: VITAMIN C   aspirin EC 81 MG tablet   CALCIUM PO   ferrous gluconate 324 MG tablet Commonly known as: FERGON   FIBER 7 PO   fluticasone 50 MCG/ACT nasal spray Commonly known as: FLONASE   lisinopril 40 MG tablet Commonly known as: ZESTRIL   nitroGLYCERIN 0.4 MG SL tablet Commonly known as: NITROSTAT   VITAMIN B 12 PO       TAKE these medications    acetaminophen 325 MG tablet Commonly known as: TYLENOL Take 325 mg by mouth every 6 (six) hours as needed for mild pain, moderate pain, fever or headache.   albuterol (2.5 MG/3ML) 0.083% nebulizer solution Commonly known as: PROVENTIL INHALE THE CONTENTS OF 1 VIAL VIA NEBULIZER EVERY 6 HOURS AS NEEDED FOR SHORTNESS OF BREATH OR FOR WHEEZING What changed: Another medication with the same name was changed. Make sure you understand how and when to take each.   albuterol 108 (90 Base) MCG/ACT inhaler Commonly known as: Ventolin HFA Inhale 2 puffs into the lungs every 6 (six) hours as needed for wheezing or shortness of breath. What changed: reasons to take this   amLODipine 10 MG tablet Commonly known as: NORVASC Take 1 tablet (10 mg total) by mouth at bedtime. Home med, but change the time of administration to nightly. What changed:  when to take this additional instructions   budesonide 0.25 MG/2ML nebulizer solution Commonly known as: PULMICORT Take 0.25 mg by nebulization daily. What changed: Another medication with the same name was removed. Continue  taking this medication, and follow the directions you see here.   cloNIDine 0.1 MG tablet Commonly known as: CATAPRES TAKE 1 TABLET TWICE DAILY What changed: when to take this   dicyclomine 10 MG capsule Commonly known as: BENTYL Take 1 capsule (10 mg total) by mouth in the morning, at noon, in the evening, and at bedtime.   escitalopram 20 MG tablet Commonly known as: LEXAPRO TAKE 1 TABLET EVERY DAY   gabapentin 100 MG capsule Commonly known as: NEURONTIN TAKE 1 CAPSULE TWICE DAILY   hydrALAZINE 100 MG tablet Commonly known as: APRESOLINE Take 1 tablet (100 mg total) by mouth 3 (three) times daily. What changed:  medication strength how much to take when to take this   ipratropium-albuterol 0.5-2.5 (3) MG/3ML Soln Commonly known as: DUONEB Take 3 mLs by nebulization every 6 (six) hours.   Melatonin 5 MG Chew Chew 1 tablet  by mouth at bedtime as needed (for sleep). What changed:  how much to take when to take this reasons to take this   metoprolol succinate 100 MG 24 hr tablet Commonly known as: TOPROL-XL Take 1 tablet (100 mg total) by mouth daily. Take with or immediately following a meal. What changed:  medication strength how much to take additional instructions   montelukast 10 MG tablet Commonly known as: SINGULAIR TAKE 1 TABLET AT BEDTIME   omeprazole 20 MG capsule Commonly known as: PRILOSEC TAKE 1 CAPSULE TWICE DAILY BEFORE MEALS   spironolactone 25 MG tablet Commonly known as: ALDACTONE Hold until outpatient followup with cardiology due to acute kidney injury. What changed:  how much to take how to take this when to take this additional instructions   torsemide 10 MG tablet Commonly known as: [place] until outpatient followup with cardiology due to acute kidney injury. What changed:  how much to take how to take this when to take this additional instructions         Follow-up Information     Callwood, Karma Greaser D, MD. Go in 1  week(s).   Specialties: Cardiology, Internal Medicine Why: Appointment on Monday, 06/28/2022 at 2:30pm with Estill Bamberg. Contact information: [place] [address] [phone]         Glean Hess, MD Follow up in 1 week(s).   Specialty: Internal Medicine Contact information: [place] [address] [phone]                 Allergies  Allergen Reactions   Penicillins Anaphylaxis, Rash and Other (See Comments)    Tolerated 5 day course of cefdinir at [facility] 04/2022   Azithromycin Other (See Comments)    abd pain    Gemfibrozil Nausea And Vomiting and Rash        Solifenacin     Mild urinary retention   Statins Nausea And Vomiting and Rash    Stomach pain       The results of significant diagnostics from this hospitalization (including imaging, microbiology, ancillary and laboratory) are listed below for reference.   Consultations:   Procedures/Studies: ECHOCARDIOGRAM COMPLETE  Result Date: 06/09/2022    ECHOCARDIOGRAM REPORT   Patient Name:   RUNELL KITCHINGS Date of Exam: 06/08/2022 Medical Rec #:  [id number]     Height:       60.0 in Accession #:    [id number]    Weight:       150.4 lb Date of Birth:  [birth year]     BSA:          1.653 m Patient Age:    48 years      BP:           178/78 mmHg Patient Gender: F             HR:           63 bpm. Exam Location:  [facility] Procedure: 2D Echo, Cardiac Doppler and Color Doppler Indications:     CHF  History:         Patient has no prior history of Echocardiogram examinations.                  CHF, CAD, COPD, Arrythmias:Atrial Fibrillation; Risk                  Factors:Hypertension and Dyslipidemia. Sick sinus syndrome.  Sonographer:     Wenda Low Referring Phys:  [id number] Tim Lair  MICHELLE TANG Diagnosing Phys: Yolonda Kida MD IMPRESSIONS  1. Left ventricular ejection fraction, by estimation, is 55 to 60%. The left ventricle has normal function. The left ventricle  demonstrates global hypokinesis. There is moderate concentric left ventricular hypertrophy. Left ventricular diastolic parameters are consistent with Grade II diastolic dysfunction (pseudonormalization).  2. Right ventricular systolic function is low normal. The right ventricular size is normal. There is severely elevated pulmonary artery systolic pressure.  3. Left atrial size was mild to moderately dilated.  4. Right atrial size was mildly dilated.  5. The mitral valve is normal in structure. Moderate mitral valve regurgitation.  6. The aortic valve is normal in structure. Aortic valve regurgitation is not visualized. Aortic valve sclerosis is present, with no evidence of aortic valve stenosis. FINDINGS  Left Ventricle: Left ventricular ejection fraction, by estimation, is 55 to 60%. The left ventricle has normal function. The left ventricle demonstrates global hypokinesis. The left ventricular internal cavity size was normal in size. There is moderate concentric left ventricular hypertrophy. Left ventricular diastolic parameters are consistent with Grade II diastolic dysfunction (pseudonormalization). Right Ventricle: The right ventricular size is normal. No increase in right ventricular wall thickness. Right ventricular systolic function is low normal. There is severely elevated pulmonary artery systolic pressure. The tricuspid regurgitant velocity is 3.86 m/s, and with an assumed right atrial pressure of 15 mmHg, the estimated right ventricular systolic pressure is [id number] mmHg. Left Atrium: Left atrial size was mild to moderately dilated. Right Atrium: Right atrial size was mildly dilated. Pericardium: There is no evidence of pericardial effusion. Mitral Valve: The mitral valve is normal in structure. Moderate mitral valve regurgitation. MV peak gradient, 11.4 mmHg. The mean mitral valve gradient is 2.0 mmHg. Tricuspid Valve: The tricuspid valve is normal in structure. Tricuspid valve regurgitation is mild.  Aortic Valve: The aortic valve is normal in structure. Aortic valve regurgitation is not visualized. Aortic valve sclerosis is present, with no evidence of aortic valve stenosis. Aortic valve mean gradient measures 6.0 mmHg. Aortic valve peak gradient measures 13.4 mmHg. Aortic valve area, by VTI measures 2.24 cm. Pulmonic Valve: The pulmonic valve was normal in structure. Pulmonic valve regurgitation is trivial. Aorta: The ascending aorta was not well visualized. IAS/Shunts: No atrial level shunt detected by color flow Doppler.  LEFT VENTRICLE PLAX 2D LVIDd:         4.30 cm   Diastology LVIDs:         2.90 cm   LV e' medial:    8.70 cm/s LV PW:         1.50 cm   LV E/e' medial:  13.8 LV IVS:        1.60 cm   LV e' lateral:   9.36 cm/s LVOT diam:     2.00 cm   LV E/e' lateral: 12.8 LV SV:         99 LV SV Index:   60 LVOT Area:     3.14 cm  RIGHT VENTRICLE RV Basal diam:  3.90 cm RV Mid diam:    3.20 cm RV S prime:     10.90 cm/s TAPSE (M-mode): 2.1 cm LEFT ATRIUM             Index        RIGHT ATRIUM           Index LA diam:        4.70 cm 2.84 cm/m   RA Area:     22.90 cm LA  Vol [facility]):   87.1 ml 52.68 ml/m  RA Volume:   72.50 ml  43.85 ml/m LA Vol (A4C):   97.2 ml 58.79 ml/m LA Biplane Vol: 96.0 ml 58.06 ml/m  AORTIC VALVE                     PULMONIC VALVE AV Area (Vmax):    2.18 cm      PV Vmax:       1.11 m/s AV Area (Vmean):   2.22 cm      PV Peak grad:  4.9 mmHg AV Area (VTI):     2.24 cm AV Vmax:           183.00 cm/s AV Vmean:          115.000 cm/s AV VTI:            0.440 m AV Peak Grad:      13.4 mmHg AV Mean Grad:      6.0 mmHg LVOT Vmax:         127.00 cm/s LVOT Vmean:        81.100 cm/s LVOT VTI:          0.314 m LVOT/AV VTI ratio: 0.71  AORTA Ao Root diam: 3.10 cm MITRAL VALVE                TRICUSPID VALVE MV Area (PHT): 4.60 cm     TR Peak grad:   59.6 mmHg MV Area VTI:   2.43 cm     TR Vmax:        386.00 cm/s MV Peak grad:  11.4 mmHg MV Mean grad:  2.0 mmHg     SHUNTS MV Vmax:        1.69 m/s     Systemic VTI:  0.31 m MV Vmean:      62.5 cm/s    Systemic Diam: 2.00 cm MV Decel Time: 165 msec MV E velocity: 120.00 cm/s MV A velocity: 51.90 cm/s MV E/A ratio:  2.31 Yolonda Kida MD Electronically signed by Yolonda Kida MD Signature Date/Time: 06/09/2022/6:46:54 PM    Final    DG Thoracic Spine 2 View  Result Date: 06/08/2022 CLINICAL DATA:  Back pain following fall EXAM: THORACIC SPINE 2 VIEWS COMPARISON:  Chest radiograph 10/17/2019 FINDINGS: Stable compression deformity of T6 without associated retropulsion. No acute fracture or listhesis of the thoracic spine. Remaining vertebral body height is preserved. Mild endplate changes noted within the midthoracic spine in keeping with changes of mild degenerative disc disease. Paraspinal soft tissues are unremarkable. IMPRESSION: 1. No acute fracture or listhesis. 2. Stable T6 compression deformity. Electronically Signed   By: Fidela Salisbury M.D.   On: 06/08/2022 03:53   DG Chest Port 1 View  Result Date: 06/08/2022 CLINICAL DATA:  Sepsis, back pain EXAM: PORTABLE CHEST 1 VIEW COMPARISON:  10/17/2019 FINDINGS: The lungs are symmetrically well expanded. Chronic interstitial changes are seen. No confluent pulmonary infiltrate. No pneumothorax or pleural effusion. Atherosclerotic calcification noted within the aorta. Mild cardiomegaly is stable when accounting for supine positioning. Pulmonary vascularity is normal. No acute bone abnormality. IMPRESSION: 1. No active disease. 2. Stable cardiomegaly. Electronically Signed   By: Fidela Salisbury M.D.   On: 06/08/2022 03:51      Labs: BNP (last 3 results) Recent Labs    06/08/22 0307  BNP [id number]*   Basic Metabolic Panel: Recent Labs  Lab 06/11/22 0214 06/12/22 0424 06/13/22 0526 06/14/22 0609 06/15/22 0324  NA  137 135 136 134* 133*  K 3.8 3.8 3.9 3.7 3.5  CL 102 100 101 100 101  CO2 26 25 26 24 25  $ GLUCOSE 99 108* 117* 111* 110*  BUN 14 20 27* 35* 33*  CREATININE 1.10*  1.34* 1.54* 1.91* 1.44*  CALCIUM 9.6 9.8 9.7 9.4 9.8  MG 2.0 2.0 2.4 2.2 2.3   Liver Function Tests: No results for input(s): "AST", "ALT", "ALKPHOS", "BILITOT", "PROT", "ALBUMIN" in the last 168 hours. No results for input(s): "LIPASE", "AMYLASE" in the last 168 hours. No results for input(s): "AMMONIA" in the last 168 hours. CBC: Recent Labs  Lab 06/11/22 0214 06/12/22 0424 06/13/22 0526 06/14/22 0609 06/15/22 0324  WBC 8.6 7.9 7.1 6.5 7.3  HGB 11.6* 11.7* 12.0 12.0 12.1  HCT 36.7 37.3 37.5 38.1 38.3  MCV 84.4 84.0 83.5 83.7 84.4  PLT 219 162 158 196 266   Cardiac Enzymes: No results for input(s): "CKTOTAL", "CKMB", "CKMBINDEX", "TROPONINI" in the last 168 hours. BNP: Invalid input(s): "POCBNP" CBG: No results for input(s): "GLUCAP" in the last 168 hours. D-Dimer No results for input(s): "DDIMER" in the last 72 hours. Hgb A1c No results for input(s): "HGBA1C" in the last 72 hours. Lipid Profile No results for input(s): "CHOL", "HDL", "LDLCALC", "TRIG", "CHOLHDL", "LDLDIRECT" in the last 72 hours. Thyroid function studies No results for input(s): "TSH", "T4TOTAL", "T3FREE", "THYROIDAB" in the last 72 hours.  Invalid input(s): "FREET3" Anemia work up No results for input(s): "VITAMINB12", "FOLATE", "FERRITIN", "TIBC", "IRON", "RETICCTPCT" in the last 72 hours. Urinalysis    Component Value Date/Time   COLORURINE YELLOW (A) 06/08/2022 0426   APPEARANCEUR CLEAR (A) 06/08/2022 0426   LABSPEC 1.003 (L) 06/08/2022 0426   PHURINE 7.0 06/08/2022 0426   GLUCOSEU NEGATIVE 06/08/2022 0426   HGBUR NEGATIVE 06/08/2022 0426   BILIRUBINUR NEGATIVE 06/08/2022 0426   BILIRUBINUR neg 10/09/2019 1114   KETONESUR NEGATIVE 06/08/2022 0426   PROTEINUR 100 (A) 06/08/2022 0426   UROBILINOGEN 0.2 10/09/2019 1114   NITRITE POSITIVE (A) 06/08/2022 0426   LEUKOCYTESUR MODERATE (A) 06/08/2022 0426   Sepsis Labs Recent Labs  Lab 06/12/22 0424 06/13/22 0526 06/14/22 0609  06/15/22 0324  WBC 7.9 7.1 6.5 7.3   Microbiology Recent Results (from the past 240 hour(s))  Resp panel by RT-PCR (RSV, Flu A&B, Covid) Anterior Nasal Swab     Status: None   Collection Time: 06/08/22  3:07 AM   Specimen: Anterior Nasal Swab  Result Value Ref Range Status   SARS Coronavirus 2 by RT PCR NEGATIVE NEGATIVE Final    Comment: (NOTE) SARS-CoV-2 target nucleic acids are NOT DETECTED.  The SARS-CoV-2 RNA is generally detectable in upper respiratory specimens during the acute phase of infection. The lowest concentration of SARS-CoV-2 viral copies this assay can detect is 138 copies/mL. A negative result does not preclude SARS-Cov-2 infection and should not be used as the sole basis for treatment or other patient management decisions. A negative result may occur with  improper specimen collection/handling, submission of specimen other than nasopharyngeal swab, presence of viral mutation(s) within the areas targeted by this assay, and inadequate number of viral copies(<138 copies/mL). A negative result must be combined with clinical observations, patient history, and epidemiological information. The expected result is Negative.  Fact Sheet for Patients:  EntrepreneurPulse.com.au  Fact Sheet for Healthcare Providers:  IncredibleEmployment.be  This test is no t yet approved or cleared by the [place] FDA and  has been authorized for detection and/or diagnosis of SARS-CoV-2 by FDA  under an Emergency Use Authorization (EUA). This EUA will remain  in effect (meaning this test can be used) for the duration of the COVID-19 declaration under Section 564(b)(1) of the Act, 21 U.S.C.section 360bbb-3(b)(1), unless the authorization is terminated  or revoked sooner.       Influenza A by PCR NEGATIVE NEGATIVE Final   Influenza B by PCR NEGATIVE NEGATIVE Final    Comment: (NOTE) The Xpert Xpress SARS-CoV-2/FLU/RSV plus assay is intended as  an aid in the diagnosis of influenza from Nasopharyngeal swab specimens and should not be used as a sole basis for treatment. Nasal washings and aspirates are unacceptable for Xpert Xpress SARS-CoV-2/FLU/RSV testing.  Fact Sheet for Patients: EntrepreneurPulse.com.au  Fact Sheet for Healthcare Providers: IncredibleEmployment.be  This test is not yet approved or cleared by the [place] FDA and has been authorized for detection and/or diagnosis of SARS-CoV-2 by FDA under an Emergency Use Authorization (EUA). This EUA will remain in effect (meaning this test can be used) for the duration of the COVID-19 declaration under Section 564(b)(1) of the Act, 21 U.S.C. section 360bbb-3(b)(1), unless the authorization is terminated or revoked.     Resp Syncytial Virus by PCR NEGATIVE NEGATIVE Final    Comment: (NOTE) Fact Sheet for Patients: EntrepreneurPulse.com.au  Fact Sheet for Healthcare Providers: IncredibleEmployment.be  This test is not yet approved or cleared by the [place] FDA and has been authorized for detection and/or diagnosis of SARS-CoV-2 by FDA under an Emergency Use Authorization (EUA). This EUA will remain in effect (meaning this test can be used) for the duration of the COVID-19 declaration under Section 564(b)(1) of the Act, 21 U.S.C. section 360bbb-3(b)(1), unless the authorization is terminated or revoked.  Performed at [facility], [address] [place], [place] [address]   Blood culture (routine single)     Status: None   Collection Time: 06/08/22  3:07 AM   Specimen: BLOOD RIGHT ARM  Result Value Ref Range Status   Specimen Description BLOOD RIGHT ARM  Final   Special Requests   Final    BOTTLES DRAWN AEROBIC AND ANAEROBIC Blood Culture adequate volume   Culture   Final    NO GROWTH 5 DAYS Performed at [facility], [address], [place], [place]  [address]    Report Status 06/13/2022 FINAL  Final  Urine Culture     Status: Abnormal   Collection Time: 06/08/22  4:26 AM   Specimen: Urine, Clean Catch  Result Value Ref Range Status   Specimen Description   Final    URINE, CLEAN CATCH Performed at [facility], [place] [address] [place], [place] [address]    Special Requests   Final    NONE Reflexed from [id number] Performed at [facility], [address] [place], [place] [address]    Culture (A)  Final    >=100,000 COLONIES/mL ESCHERICHIA COLI Confirmed Extended Spectrum Beta-Lactamase Producer (ESBL).  In bloodstream infections from ESBL organisms, carbapenems are preferred over piperacillin/tazobactam. They are shown to have a lower risk of mortality.    Report Status 06/10/2022 FINAL  Final   Organism ID, Bacteria ESCHERICHIA COLI (A)  Final      Susceptibility   Escherichia coli - MIC*    AMPICILLIN >=32 RESISTANT Resistant     CEFAZOLIN >=64 RESISTANT Resistant     CEFEPIME 0.5 SENSITIVE Sensitive     CEFTRIAXONE >=64 RESISTANT Resistant     CIPROFLOXACIN >=4 RESISTANT Resistant     GENTAMICIN >=16  RESISTANT Resistant     IMIPENEM <=0.25 SENSITIVE Sensitive     NITROFURANTOIN 64 INTERMEDIATE Intermediate     TRIMETH/SULFA >=320 RESISTANT Resistant     AMPICILLIN/SULBACTAM >=32 RESISTANT Resistant     PIP/TAZO <=4 SENSITIVE Sensitive     * >=100,000 COLONIES/mL ESCHERICHIA COLI  MRSA Next Gen by PCR, Nasal     Status: None   Collection Time: 06/08/22  5:49 PM   Specimen: Nasal Mucosa; Nasal Swab  Result Value Ref Range Status   MRSA by PCR Next Gen NOT DETECTED NOT DETECTED Final    Comment: (NOTE) The GeneXpert MRSA Assay (FDA approved for NASAL specimens only), is one component of a comprehensive MRSA colonization surveillance program. It is not intended to diagnose MRSA infection nor to guide or monitor treatment for MRSA infections. Test performance is not FDA approved in patients less than 33  years old. Performed at [facility], [address] [place], [place] [address]      Total time spend on discharging this patient, including the last patient exam, discussing the hospital stay, instructions for ongoing care as it relates to all pertinent caregivers, as well as preparing the medical discharge records, prescriptions, and/or referrals as applicable, is 35 minutes.    Enzo Bi, MD  [facility] 06/15/2022, 1:23 PM

## 2022-06-15 NOTE — TOC Progression Note (Addendum)
Transition of Care [facility]) - Progression Note    Patient Details  Name: Jessica Howell MRN: [id number] Date of Birth: [birth year]  Transition of Care [facility]) CM/SW [place], LCSW Phone Number: 06/15/2022, 11:13 AM  Clinical Narrative:  Asked RN for sats test to determine if patient needs oxygen at discharge.    12:28 pm: Faxed FL2 and discharge summary to ALF for review. Per Dottie at the facility, patient was only on prn oxygen prior to admission. She is not sure which company it was through. [place] had set her up so admissions coordinator will call back with agency name. Daughter is in the hospital and unable to transport. She also confirmed that no other family is able to transport. Facility said their transportation is tied up with HD residents today so they are unable to transport as well. Daughter is agreeable to EMS transport and is aware she will likely receive a bill.  1:57 pm: Per [employer], patient has all needed equipment for continuous oxygen. Faxed updated FL2 and discharge summary to [facility]. Left message for Dottie asking her to call back and let me know when I can set up EMS transport.  Expected Discharge Plan:  [facility]) Barriers to Discharge: Continued Medical Work up  Expected Discharge Plan and Services       Living arrangements for the past 2 months:  ([place]) Expected Discharge Date: 06/15/22                                     Social Determinants of Health (SDOH) Interventions SDOH Screenings   Food Insecurity: No Food Insecurity (06/08/2022)  Housing: Low Risk  (06/08/2022)  Transportation Needs: No Transportation Needs (06/08/2022)  Utilities: Not At Risk (06/08/2022)  Alcohol Screen: Low Risk  (12/08/2021)  Depression (PHQ2-9): High Risk (04/06/2022)  Financial Resource Strain: Low Risk  (12/08/2021)  Physical Activity: Inactive (09/26/2019)  Social Connections: Socially Isolated (12/08/2021)  Stress: Stress Concern  Present (09/26/2019)  Tobacco Use: Medium Risk (06/08/2022)    Readmission Risk Interventions     No data to display

## 2022-06-15 NOTE — TOC Transition Note (Signed)
Transition of Care [facility]) - CM/SW Discharge Note   Patient Details  Name: Jessica Howell MRN: [id number] Date of Birth: 01/29/1935  Transition of Care [facility]) CM/SW Contact:  Candie Chroman, LCSW Phone Number: 06/15/2022, 2:20 PM   Clinical Narrative:   Patient has orders to discharge to [facility]F. EMS transport has been arranged and they said there were a few people in front of her. Sent text message to daughter to notify per request. RN will call report to [phone]. No further concerns. CSW signing off.  Final next level of care: Assisted Living Barriers to Discharge: Barriers Resolved   Patient Goals and CMS Choice CMS Medicare.gov Compare Post Acute Care list provided to:: Patient Represenative (must comment) (daughter) Choice offered to / list presented to : Adult Children  Discharge Placement                  Patient to be transferred to facility by: EMS Name of family member notified: Hilda Blades Coggin Patient and family notified of of transfer: 06/15/22  Discharge Plan and Services Additional resources added to the After Visit Summary for                                       Social Determinants of Health (SDOH) Interventions SDOH Screenings   Food Insecurity: No Food Insecurity (06/08/2022)  Housing: Low Risk  (06/08/2022)  Transportation Needs: No Transportation Needs (06/08/2022)  Utilities: Not At Risk (06/08/2022)  Alcohol Screen: Low Risk  (12/08/2021)  Depression (PHQ2-9): High Risk (04/06/2022)  Financial Resource Strain: Low Risk  (12/08/2021)  Physical Activity: Inactive (09/26/2019)  Social Connections: Socially Isolated (12/08/2021)  Stress: Stress Concern Present (09/26/2019)  Tobacco Use: Medium Risk (06/08/2022)     Readmission Risk Interventions     No data to display

## 2022-06-16 ENCOUNTER — Telehealth: Payer: Self-pay | Admitting: Internal Medicine

## 2022-06-16 ENCOUNTER — Encounter: Payer: Self-pay | Admitting: Internal Medicine

## 2022-06-16 NOTE — Telephone Encounter (Signed)
Copied from [place] [id number]. Topic: General - Other >> Jun 16, 2022  3:58 PM Ja-Kwan M wrote: Reason for CRM: Dottie with [facility] stated she has not received the [facility] form back regarding the changes in the patient medications since leaving the hospital. Cb# [phone]

## 2022-06-16 NOTE — Telephone Encounter (Signed)
Faxed forms.  Jessica Howell

## 2022-06-21 ENCOUNTER — Other Ambulatory Visit: Payer: Self-pay | Admitting: Internal Medicine

## 2022-06-21 DIAGNOSIS — I10 Essential (primary) hypertension: Secondary | ICD-10-CM

## 2022-06-23 ENCOUNTER — Telehealth: Payer: Self-pay | Admitting: Family

## 2022-06-23 ENCOUNTER — Encounter: Payer: Medicare HMO | Admitting: Family

## 2022-06-23 NOTE — Telephone Encounter (Signed)
Patient did not show for her initial [facility] appointment on 06/23/22.

## 2022-07-07 ENCOUNTER — Telehealth: Payer: Self-pay | Admitting: Internal Medicine

## 2022-07-07 NOTE — Telephone Encounter (Signed)
Copied from [place] [phone]. Topic: General - Inquiry >> Jul 07, 2022  2:17 PM Erskine Squibb wrote: Reason for CRM: Correll with [facility] assisted living is faxing over a care plan that she needs signed and faxed over. Please assist further. Fax number is [phone]

## 2022-07-07 NOTE — Telephone Encounter (Signed)
Received fax and placed form in Dr Gaspar Cola box for signature.  Jessica Howell

## 2022-07-07 NOTE — Telephone Encounter (Signed)
Awaiting fax.

## 2022-07-10 IMAGING — US US FNA BIOPSY THYROID 1ST LESION
1 series · 10 of 10 positions shown · non-contrast
Comparison: Thyroid ultrasound performed 11/12/2019

MEDICATIONS:
None

COMPLICATIONS:
None immediate.

INDICATION: Indeterminate right inferior thyroid nodule

EXAM:
ULTRASOUND GUIDED FINE NEEDLE ASPIRATION OF INDETERMINATE RIGHT
INFERIOR THYROID NODULE
TECHNIQUE: Informed written consent was obtained from the patient after a
discussion of the risks, benefits and alternatives to treatment.
Questions regarding the procedure were encouraged and answered. A
timeout was performed prior to the initiation of the procedure.

[Series 1: us fna biopsy thyroid 1st lesion · 10 acquisitions, 10 frames shown]
[im 1/10]
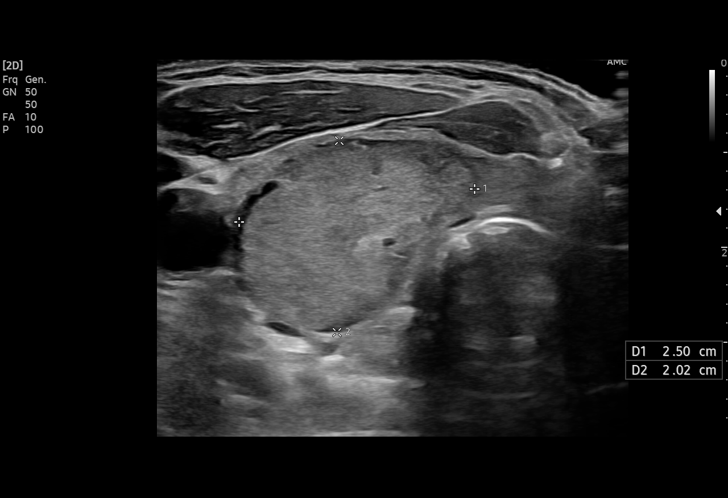
[im 2/10]
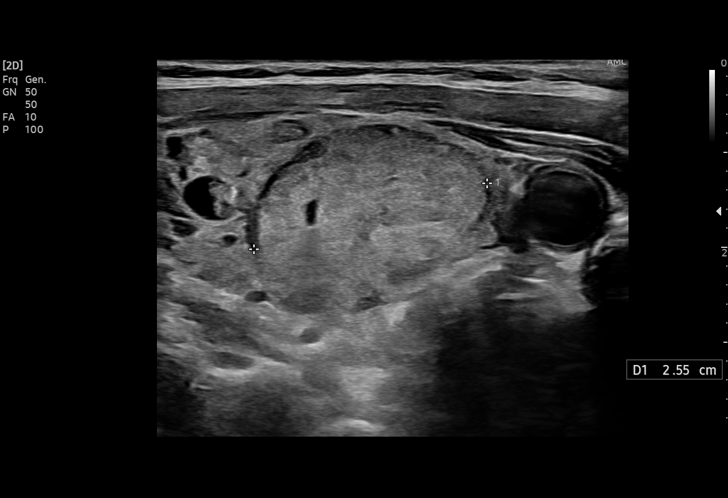
[im 3/10]
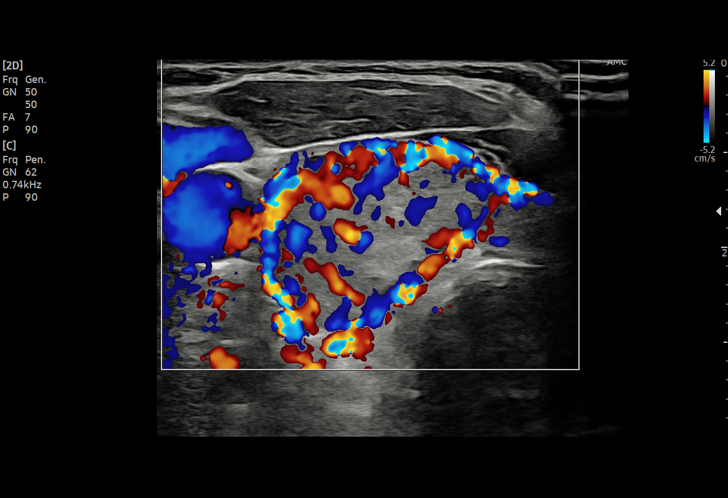
[im 4/10]
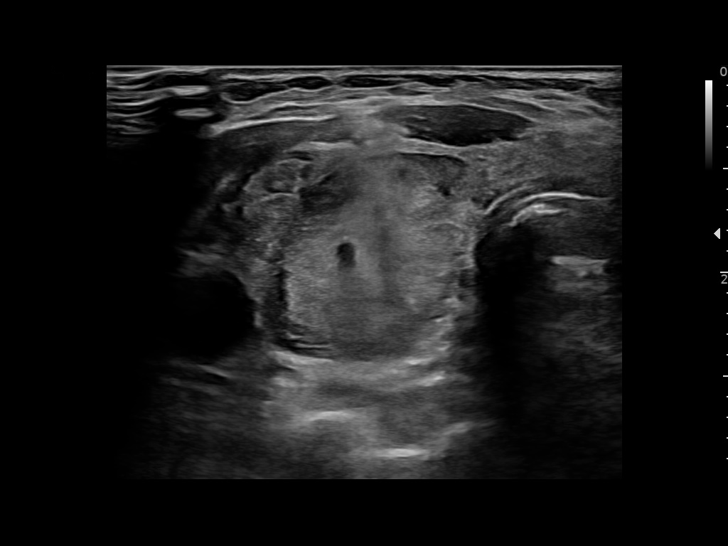
[im 5/10]
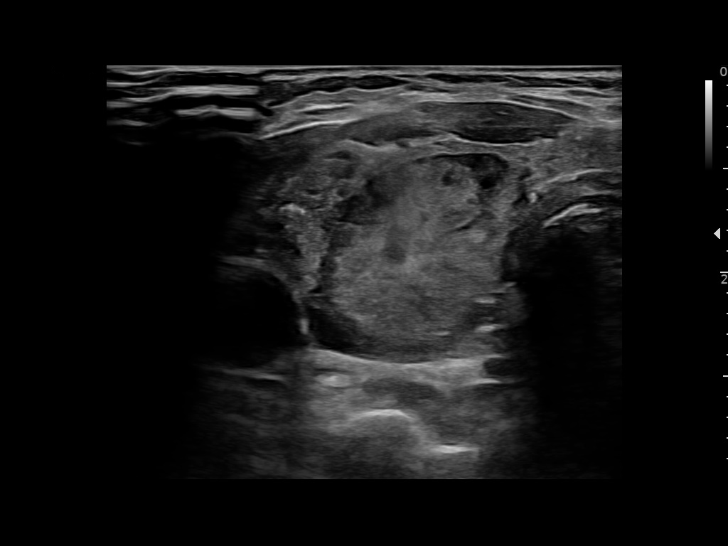
[im 6/10]
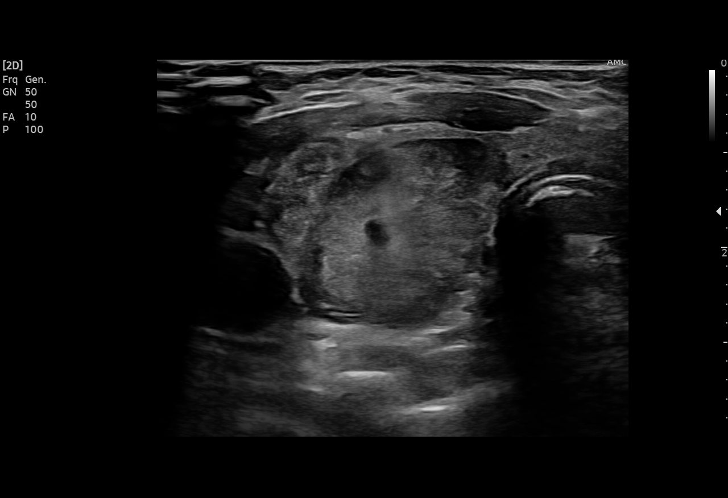
[im 7/10]
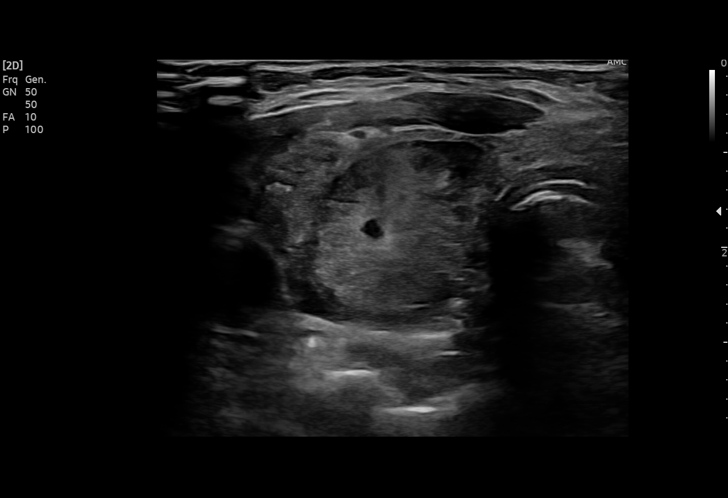
[im 8/10]
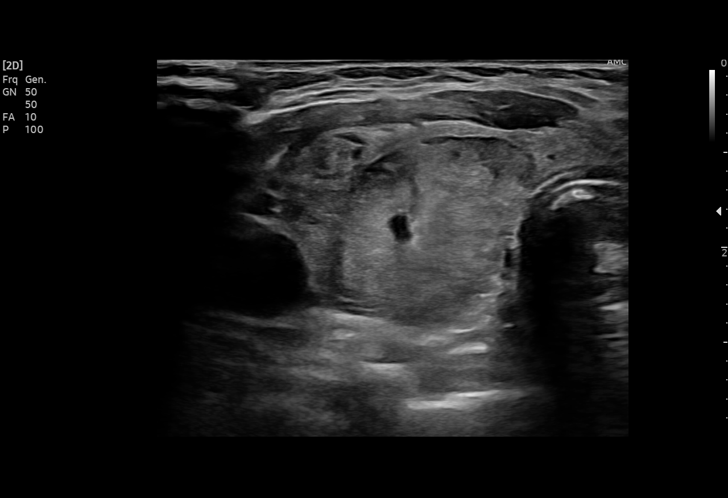
[im 9/10]
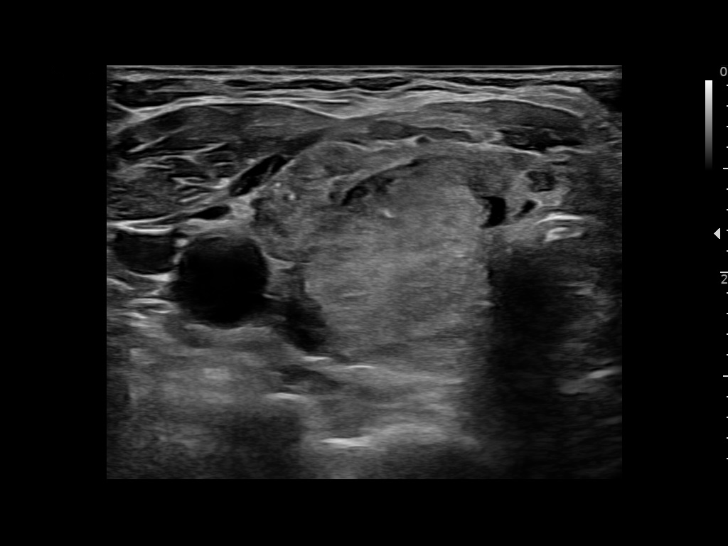
[im 10/10]
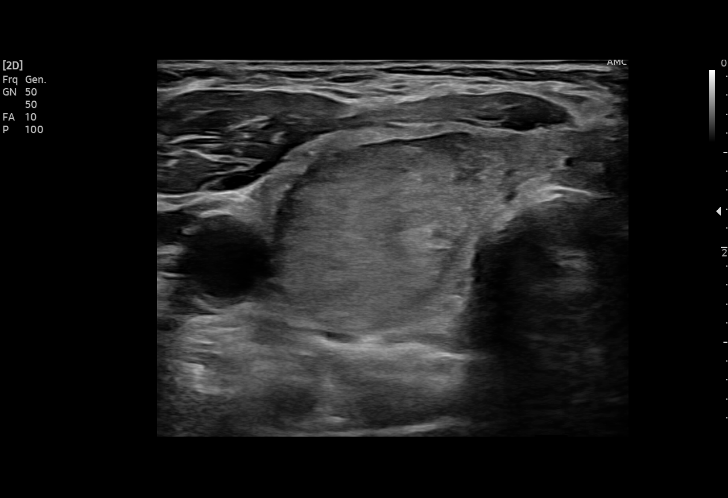

[10 of 10 positions shown; findings below may reference images not displayed]

Pre-procedural ultrasound scanning demonstrated unchanged size and
appearance of the indeterminate nodule within the right thyroid lobe

The procedure was planned. The neck was prepped in the usual sterile
fashion, and a sterile drape was applied covering the operative
field. A timeout was performed prior to the initiation of the
procedure. Local anesthesia was provided with 1% lidocaine.

Under direct ultrasound guidance, 6 FNA biopsies were performed of
the right inferior thyroid nodule with a 25 gauge needle. Multiple
ultrasound images were saved for procedural documentation purposes.
The samples were prepared and submitted to pathology.

Limited post procedural scanning was negative for hematoma or
additional complication. Dressings were placed. The patient
tolerated the above procedures procedure well without immediate
postprocedural complication.
FINDINGS: FINDINGS
Nodule reference number based on prior diagnostic ultrasound: 3

Maximum size: 2.6 cm

Location: Right  ;  Inferior

ACR TI-RADS total points: 3

ACR TI-RADS risk category:  TR3 (3 points)

Prior biopsy:  No

Reason for biopsy: meets ACR TI-RADS criteria

Ultrasound imaging confirms appropriate placement of the needles
within the thyroid nodule.
IMPRESSION: Technically successful ultrasound guided fine needle aspiration of
right inferior thyroid nodule as described above.

## 2022-07-11 ENCOUNTER — Other Ambulatory Visit: Payer: Self-pay | Admitting: Internal Medicine

## 2022-07-11 DIAGNOSIS — J449 Chronic obstructive pulmonary disease, unspecified: Secondary | ICD-10-CM

## 2022-07-13 NOTE — Telephone Encounter (Signed)
Requested Prescriptions  Pending Prescriptions Disp Refills   albuterol (PROVENTIL) (2.5 MG/3ML) 0.083% nebulizer solution [Pharmacy Med Name: ALBUTEROL SULFATE (2.5 MG/3ML) 0.083% Nebulization Solution] 540 mL 0    Sig: INHALE THE CONTENTS OF 1 VIAL VIA NEBULIZER EVERY 6 HOURS AS NEEDED FOR SHORTNESS OF BREATH OR FOR WHEEZING     Pulmonology:  Beta Agonists 2 Failed - 07/11/2022  6:46 PM      Failed - Last BP in normal range    BP Readings from Last 1 Encounters:  06/15/22 (!) 158/50         Passed - Last Heart Rate in normal range    Pulse Readings from Last 1 Encounters:  06/15/22 63         Passed - Valid encounter within last 12 months    Recent Outpatient Visits           3 months ago Acute exacerbation of chronic obstructive pulmonary disease (COPD) ([place])   [place] [facility] at [facility], Jesse Sans, MD   8 months ago Chronic obstructive pulmonary disease, unspecified COPD type [facility])   [place] at [facility], Jesse Sans, MD   1 year ago RUQ abdominal pain   [place] [facility] [place] [facility] [place], Jesse Sans, MD   2 years ago Essential (primary) hypertension   [place] [facility] [place] at [facility], Jesse Sans, MD   2 years ago Essential (primary) hypertension   [place] at [facility], Jesse Sans, MD

## 2022-07-17 ENCOUNTER — Emergency Department: Payer: Medicare HMO

## 2022-07-17 ENCOUNTER — Emergency Department
Admission: EM | Admit: 2022-07-17 | Discharge: 2022-07-17 | Disposition: A | Discharge status: Home / Self Care | Payer: Medicare HMO | Attending: Emergency Medicine | Admitting: Emergency Medicine

## 2022-07-17 ENCOUNTER — Other Ambulatory Visit: Payer: Self-pay

## 2022-07-17 ENCOUNTER — Encounter: Payer: Self-pay | Admitting: *Deleted

## 2022-07-17 DIAGNOSIS — J449 Chronic obstructive pulmonary disease, unspecified: Secondary | ICD-10-CM | POA: Diagnosis not present

## 2022-07-17 DIAGNOSIS — S8991XA Unspecified injury of right lower leg, initial encounter: Secondary | ICD-10-CM | POA: Insufficient documentation

## 2022-07-17 DIAGNOSIS — S0990XA Unspecified injury of head, initial encounter: Secondary | ICD-10-CM | POA: Insufficient documentation

## 2022-07-17 DIAGNOSIS — S8992XA Unspecified injury of left lower leg, initial encounter: Secondary | ICD-10-CM | POA: Diagnosis not present

## 2022-07-17 DIAGNOSIS — F0390 Unspecified dementia without behavioral disturbance: Secondary | ICD-10-CM | POA: Insufficient documentation

## 2022-07-17 DIAGNOSIS — I509 Heart failure, unspecified: Secondary | ICD-10-CM | POA: Insufficient documentation

## 2022-07-17 DIAGNOSIS — W19XXXA Unspecified fall, initial encounter: Secondary | ICD-10-CM | POA: Diagnosis not present

## 2022-07-17 DIAGNOSIS — R519 Headache, unspecified: Secondary | ICD-10-CM | POA: Diagnosis not present

## 2022-07-17 DIAGNOSIS — W01198A Fall on same level from slipping, tripping and stumbling with subsequent striking against other object, initial encounter: Secondary | ICD-10-CM | POA: Insufficient documentation

## 2022-07-17 DIAGNOSIS — S8011XA Contusion of right lower leg, initial encounter: Secondary | ICD-10-CM | POA: Diagnosis not present

## 2022-07-17 DIAGNOSIS — S0093XA Contusion of unspecified part of head, initial encounter: Secondary | ICD-10-CM | POA: Diagnosis not present

## 2022-07-17 DIAGNOSIS — M25572 Pain in left ankle and joints of left foot: Secondary | ICD-10-CM | POA: Diagnosis not present

## 2022-07-17 DIAGNOSIS — M79605 Pain in left leg: Secondary | ICD-10-CM | POA: Diagnosis not present

## 2022-07-17 DIAGNOSIS — I10 Essential (primary) hypertension: Secondary | ICD-10-CM | POA: Diagnosis not present

## 2022-07-17 DIAGNOSIS — S8012XA Contusion of left lower leg, initial encounter: Secondary | ICD-10-CM | POA: Diagnosis not present

## 2022-07-17 DIAGNOSIS — T07XXXA Unspecified multiple injuries, initial encounter: Secondary | ICD-10-CM

## 2022-07-17 DIAGNOSIS — R0902 Hypoxemia: Secondary | ICD-10-CM | POA: Diagnosis not present

## 2022-07-17 DIAGNOSIS — M79604 Pain in right leg: Secondary | ICD-10-CM | POA: Diagnosis not present

## 2022-07-17 DIAGNOSIS — M542 Cervicalgia: Secondary | ICD-10-CM | POA: Diagnosis not present

## 2022-07-17 HISTORY — DX: Dementia in other diseases classified elsewhere, unspecified severity, without behavioral disturbance, psychotic disturbance, mood disturbance, and anxiety: F02.80

## 2022-07-17 HISTORY — DX: Atherosclerotic heart disease of native coronary artery without angina pectoris: I25.10

## 2022-07-17 HISTORY — DX: Unspecified dementia, unspecified severity, without behavioral disturbance, psychotic disturbance, mood disturbance, and anxiety: F03.90

## 2022-07-17 HISTORY — DX: Disorder of kidney and ureter, unspecified: N28.9

## 2022-07-17 NOTE — Discharge Instructions (Addendum)
Have patient follow-up with her primary care provider if any continued problems.  CT of her head and cervical spine were negative for fracture, head injury, skull fracture.  She also had an x-ray of her left ankle which was negative for fracture.  Continue with regular medication.

## 2022-07-17 NOTE — ED Provider Notes (Signed)
[facility] Provider Note    Event Date/Time   First MD Initiated Contact with Patient 07/17/22 0920     (approximate)   History   Fall (Patient fell from a standing position, hitting her head. Complains of some head discomfort and bilateral leg pain (from a previous fall).)   HPI  Jessica Howell is a 88 y.o. female   presents to the ED via EMS from [facility] assisted living.  Medics report that patient fell from a standing position and hit her head.  Patient states that she was pushing her walker when she tripped over a cord in her room going to the bathroom.  Patient does not take blood thinners per EMS.  Patient has a history of hypertension, CHF, COPD, anemia, myocardial infarction, dementia, vitamin D deficiency.      Physical Exam   Triage Vital Signs: ED Triage Vitals  Enc Vitals Group     BP      Pulse      Resp      Temp      Temp src      SpO2      Weight      Height      Head Circumference      Peak Flow      Pain Score      Pain Loc      Pain Edu?      Excl. in [place]?     Most recent vital signs: Vitals:   07/17/22 0920 07/17/22 1240  BP: (!) 160/60 (!) 162/91  Pulse: 65 66  Resp: 18 18  Temp: 98.4 F (36.9 C) 98.1 F (36.7 C)  SpO2: 98% 100%     General: Awake, no distress.  Alert, talkative, cooperative during exam. CV:  Good peripheral perfusion.  Heart regular rate and rhythm. Resp:  Normal effort.  Lungs are clear bilaterally.  Nontender ribs to palpation bilaterally. Abd:  No distention.  Soft, nontender, bowel sounds normoactive x 4 quadrants. Other:  PERRLA, EOMI's, cranial nerves II through XII grossly intact.  No tenderness on palpation of cervical spine posteriorly.  No abrasions or lacerations noted to the scalp.  No point tenderness on palpation of the upper extremities, anterior posterior chest, thoracic or lumbar spine.  No tenderness or pain with compression of the hips bilaterally.  Patient is able to move  lower extremities without any difficulty.  She does complain of tenderness with palpation of the left ankle but no soft tissue edema or discoloration is noted.  Skin is intact.  Pulses are present bilaterally.   ED Results / Procedures / Treatments   Labs (all labs ordered are listed, but only abnormal results are displayed) Labs Reviewed - No data to display   RADIOLOGY CT head without contrast per radiologist is negative for skull fracture or acute intracranial changes.  Moderate advanced small vessel disease noted per radiologist. CT cervical spine is negative for acute injury. Left ankle x-ray images were reviewed and interpreted by myself with no fracture or dislocation noted.  Radiology report agrees.  PROCEDURES:  Critical Care performed:   Procedures   MEDICATIONS ORDERED IN ED: Medications - No data to display   IMPRESSION / MDM / [place] / ED COURSE  I reviewed the triage vital signs and the nursing notes.   Differential diagnosis includes, but is not limited to, head injury, scalp contusion, cervical contusion, sprain, subluxation, fracture.  Ankle sprain, contusion, fracture secondary to mechanical  fall.  88 year old female was brought to the ED by EMS from [facility] assisted living where she is a resident.  Patient had a mechanical fall and was sent to the ED for evaluation.  CT cervical spine and head were negative and reassuring.  Left ankle x-ray was negative for fracture and patient was made aware.  Patient is to continue with her regular medications.  She will continue using her walker as she already does and follow-up with her PCP if any continued problems.    Patient's presentation is most consistent with Patient's presentation is most consistent with acute presentation with potential threat to life or bodily function.  FINAL CLINICAL IMPRESSION(S) / ED DIAGNOSES   Final diagnoses:  Multiple contusions  Fall, initial encounter     Rx / DC  Orders   ED Discharge Orders     None        Note:  This document was prepared using Dragon voice recognition software and may include unintentional dictation errors.   Johnn Hai, PA-C 07/17/22 1331    Rada Hay, MD 07/17/22 [phone]

## 2022-07-17 NOTE — ED Triage Notes (Signed)
Per EMS report to ED Medic, Patient fell from a standing position, hitting her head. Patient is not on blood thinners. Patient c/o head pain.

## 2022-07-20 ENCOUNTER — Ambulatory Visit: Payer: Self-pay | Admitting: *Deleted

## 2022-07-20 NOTE — Telephone Encounter (Signed)
  Chief Complaint: Lonn Georgia, Med Tech from [facility] reports patient fell out of bed and found this am beside bed in floor Symptoms: no sx no bruising no skin tears no injury per Kayla. Unwitnessed fall and reports patient denies hitting head. Hx confusion  Frequency: this am  Pertinent Negatives: Patient denies hitting head. Lonn Georgia reports patient alert but confused. No injuries. No blurred vision  Disposition: [] ED /[] Urgent Care (no appt availability in office) / [] Appointment(In office/virtual)/ []  [place] [facility]/ [] Home Care/ [] Refused Recommended Disposition /[] [place] [facility]/ []  Follow-up with PCP Additional Notes:   Recommended Kayla contact MD at [facility] to assess patient and evaluate if any injury noted and call EMS iff needed to be evaluated in ED due to unwitnessed fall for any changes in mental status. Please advise. [facility] notified of patient fall. PCP not in office today .     Reason for Disposition  [1] MODERATE weakness (i.e., interferes with work, school, normal activities) AND [2] new-onset or worsening  Answer Assessment - Initial Assessment Questions 1. MECHANISM: "How did the fall happen?"     07/20/22 per Lonn Georgia med tech  2. DOMESTIC VIOLENCE AND ELDER ABUSE SCREENING: "Did you fall because someone pushed you or tried to hurt you?" If Yes, ask: "Are you safe now?"     Na  3. ONSET: "When did the fall happen?" (e.g., minutes, hours, or days ago)     This am patient found on floor beside bed 4. LOCATION: "What part of the body hit the ground?" (e.g., back, buttocks, head, hips, knees, hands, head, stomach)     Not sure , patient denies hitting head hx confusion 5. INJURY: "Did you hurt (injure) yourself when you fell?" If Yes, ask: "What did you injure? Tell me more about this?" (e.g., body area; type of injury; pain severity)"     Denies injury no cuts no bruises noted  6. PAIN: "Is there any pain?" If Yes, ask: "How bad is the pain?" (e.g., Scale  1-10; or mild,  moderate, severe)   - NONE (0): No pain   - MILD (1-3): Doesn't interfere with normal activities    - MODERATE (4-7): Interferes with normal activities or awakens from sleep    - SEVERE (8-10): Excruciating pain, unable to do any normal activities      na 7. SIZE: For cuts, bruises, or swelling, ask: "How large is it?" (e.g., inches or centimeters)      Denies   8. PREGNANCY: "Is there any chance you are pregnant?" "When was your last menstrual period?"     na 9. OTHER SYMPTOMS: "Do you have any other symptoms?" (e.g., dizziness, fever, weakness; new onset or worsening).      Na  10. CAUSE: "What do you think caused the fall (or falling)?" (e.g., tripped, dizzy spell)       Not sure hx confusion  Protocols used: Falls and [facility]

## 2022-07-22 ENCOUNTER — Telehealth: Payer: Self-pay | Admitting: *Deleted

## 2022-07-22 NOTE — Telephone Encounter (Signed)
        Patient  visited [facility] on 07/19/2022  for TREATMENT    Telephone encounter attempt :  1st  UNABLE TO COMPLETE AT [place] [facility] [phone] [address] , [place] [address] Email : [email or web address]

## 2022-07-28 DIAGNOSIS — J449 Chronic obstructive pulmonary disease, unspecified: Secondary | ICD-10-CM | POA: Diagnosis not present

## 2022-07-28 DIAGNOSIS — J441 Chronic obstructive pulmonary disease with (acute) exacerbation: Secondary | ICD-10-CM | POA: Diagnosis not present

## 2022-07-28 DIAGNOSIS — E876 Hypokalemia: Secondary | ICD-10-CM | POA: Diagnosis not present

## 2022-07-28 DIAGNOSIS — I480 Paroxysmal atrial fibrillation: Secondary | ICD-10-CM | POA: Diagnosis not present

## 2022-07-28 DIAGNOSIS — I110 Hypertensive heart disease with heart failure: Secondary | ICD-10-CM | POA: Diagnosis not present

## 2022-07-28 DIAGNOSIS — D649 Anemia, unspecified: Secondary | ICD-10-CM | POA: Diagnosis not present

## 2022-07-28 DIAGNOSIS — N183 Chronic kidney disease, stage 3 unspecified: Secondary | ICD-10-CM | POA: Diagnosis not present

## 2022-07-28 DIAGNOSIS — I10 Essential (primary) hypertension: Secondary | ICD-10-CM | POA: Diagnosis not present

## 2022-07-28 DIAGNOSIS — I5030 Unspecified diastolic (congestive) heart failure: Secondary | ICD-10-CM | POA: Diagnosis not present

## 2022-07-28 DIAGNOSIS — I130 Hypertensive heart and chronic kidney disease with heart failure and stage 1 through stage 4 chronic kidney disease, or unspecified chronic kidney disease: Secondary | ICD-10-CM | POA: Diagnosis not present

## 2022-07-28 DIAGNOSIS — K219 Gastro-esophageal reflux disease without esophagitis: Secondary | ICD-10-CM | POA: Diagnosis not present

## 2022-07-28 DIAGNOSIS — D500 Iron deficiency anemia secondary to blood loss (chronic): Secondary | ICD-10-CM | POA: Diagnosis not present

## 2022-07-30 ENCOUNTER — Other Ambulatory Visit: Payer: Self-pay

## 2022-07-30 ENCOUNTER — Emergency Department: Payer: Medicare HMO

## 2022-07-30 ENCOUNTER — Emergency Department
Admission: EM | Admit: 2022-07-30 | Discharge: 2022-07-30 | Disposition: A | Discharge status: Home / Self Care | Payer: Medicare HMO | Attending: Emergency Medicine | Admitting: Emergency Medicine

## 2022-07-30 DIAGNOSIS — W19XXXA Unspecified fall, initial encounter: Secondary | ICD-10-CM | POA: Diagnosis not present

## 2022-07-30 DIAGNOSIS — Z7901 Long term (current) use of anticoagulants: Secondary | ICD-10-CM | POA: Insufficient documentation

## 2022-07-30 DIAGNOSIS — S0990XA Unspecified injury of head, initial encounter: Secondary | ICD-10-CM

## 2022-07-30 DIAGNOSIS — J449 Chronic obstructive pulmonary disease, unspecified: Secondary | ICD-10-CM | POA: Diagnosis not present

## 2022-07-30 DIAGNOSIS — G309 Alzheimer's disease, unspecified: Secondary | ICD-10-CM | POA: Diagnosis not present

## 2022-07-30 DIAGNOSIS — I110 Hypertensive heart disease with heart failure: Secondary | ICD-10-CM | POA: Insufficient documentation

## 2022-07-30 DIAGNOSIS — W0110XA Fall on same level from slipping, tripping and stumbling with subsequent striking against unspecified object, initial encounter: Secondary | ICD-10-CM | POA: Insufficient documentation

## 2022-07-30 DIAGNOSIS — R279 Unspecified lack of coordination: Secondary | ICD-10-CM | POA: Diagnosis not present

## 2022-07-30 DIAGNOSIS — I10 Essential (primary) hypertension: Secondary | ICD-10-CM | POA: Diagnosis not present

## 2022-07-30 DIAGNOSIS — I509 Heart failure, unspecified: Secondary | ICD-10-CM | POA: Insufficient documentation

## 2022-07-30 DIAGNOSIS — Z743 Need for continuous supervision: Secondary | ICD-10-CM | POA: Diagnosis not present

## 2022-07-30 DIAGNOSIS — F0280 Dementia in other diseases classified elsewhere without behavioral disturbance: Secondary | ICD-10-CM | POA: Insufficient documentation

## 2022-07-30 DIAGNOSIS — S79822A Other specified injuries of left thigh, initial encounter: Secondary | ICD-10-CM | POA: Diagnosis not present

## 2022-07-30 DIAGNOSIS — T1490XA Injury, unspecified, initial encounter: Secondary | ICD-10-CM | POA: Diagnosis not present

## 2022-07-30 LAB — CBC
HCT: 38.7 % (ref 36.0–46.0)
Hemoglobin: 12.2 g/dL (ref 12.0–15.0)
MCH: 26.8 pg (ref 26.0–34.0)
MCHC: 31.5 g/dL (ref 30.0–36.0)
MCV: 85.1 fL (ref 80.0–100.0)
Platelets: UNDETERMINED 10*3/uL (ref 150–400)
RBC: 4.55 MIL/uL (ref 3.87–5.11)
RDW: 15.1 % (ref 11.5–15.5)
WBC: 6.4 10*3/uL (ref 4.0–10.5)
nRBC: 0 % (ref 0.0–0.2)

## 2022-07-30 LAB — BASIC METABOLIC PANEL
Anion gap: 14 (ref 5–15)
BUN: 19 mg/dL (ref 8–23)
CO2: 23 mmol/L (ref 22–32)
Calcium: 9.4 mg/dL (ref 8.9–10.3)
Chloride: 99 mmol/L (ref 98–111)
Creatinine, Ser: 1.17 mg/dL — ABNORMAL HIGH (ref 0.44–1.00)
GFR, Estimated: 46 mL/min — ABNORMAL LOW (ref >60)
Glucose, Bld: 105 mg/dL — ABNORMAL HIGH (ref 70–99)
Potassium: 3.3 mmol/L — ABNORMAL LOW (ref 3.5–5.1)
Sodium: 136 mmol/L (ref 135–145)

## 2022-07-30 LAB — TROPONIN I (HIGH SENSITIVITY): Troponin I (High Sensitivity): 33 ng/L — ABNORMAL HIGH (ref <18)

## 2022-07-30 MED ORDER — ACETAMINOPHEN 500 MG PO TABS
1000.0000 mg | ORAL_TABLET | Freq: Once | ORAL | Status: AC
  Administered 2022-07-30: 1000 mg via ORAL
  Filled 2022-07-30: qty 2

## 2022-07-30 NOTE — ED Triage Notes (Signed)
Pt here with a fall. Pt not able to answer any questions but speaking in clear sentences. Pt not able to remember if she hit her head.

## 2022-07-30 NOTE — ED Notes (Signed)
The pt was assisted with changing her depend.   Attempted to call [place] [facility] multiple times without success.

## 2022-07-30 NOTE — ED Provider Notes (Signed)
[facility] Provider Note    Event Date/Time   First MD Initiated Contact with Patient 07/30/22 1531     (approximate)   History   Fall   HPI  Jessica Howell is a 88 y.o. female   Past medical history of hypertension, atrial fibrillation on Xarelto, Alzheimer's dementia, CHF, COPD, dementia who [facility] with mechanical slip and fall while in her closet.  She hit the back of her head.  No loss of consciousness.  The patient can recall the events leading up to her injury and states that she was reaching for something on the floor of her closet when she lost her balance and fell of and struck the back of her head.  She did not lose consciousness.  She was immediately helped.  She reports no other injuries.  No recent illnesses and no other acute medical complaints.   External Medical Documents Reviewed: Discharge summary from February 2024 when she was admitted for CHF exacerbation & UTI      Physical Exam   Triage Vital Signs: ED Triage Vitals  Enc Vitals Group     BP 07/30/22 1452 (!) 166/140     Pulse Rate 07/30/22 1452 71     Resp 07/30/22 1452 18     Temp 07/30/22 1452 98.6 F (37 C)     Temp Source 07/30/22 1452 Oral     SpO2 07/30/22 1452 96 %     Weight 07/30/22 1450 146 lb 13.2 oz (66.6 kg)     Height 07/30/22 1450 5' (1.524 m)     Head Circumference --      Peak Flow --      Pain Score 07/30/22 1450 0     Pain Loc --      Pain Edu? --      Excl. in [place]? --     Most recent vital signs: Vitals:   07/30/22 1452  BP: (!) 166/140  Pulse: 71  Resp: 18  Temp: 98.6 F (37 C)  SpO2: 96%    General: Awake, no distress.  CV:  Good peripheral perfusion.  Resp:  Normal effort.  Abd:  No distention.  Other:  Wake alert pleasant laying in stretcher no acute distress.  She has no overlying signs of injury to the head.  Neck is supple with full range of motion no midline tenderness.  And the remainder of her  secondary survey reveals no signs of obvious injury deformity or tenderness palpation   ED Results / Procedures / Treatments   Labs (all labs ordered are listed, but only abnormal results are displayed) Labs Reviewed  TROPONIN I (HIGH SENSITIVITY) - Abnormal; Notable for the following components:      Result Value   Troponin I (High Sensitivity) 33 (*)    All other components within normal limits  CBC  BASIC METABOLIC PANEL     I ordered and reviewed the above labs they are notable for normal H&H at baseline and troponin 33 which is at baseline compared to prior  EKG  ED ECG REPORT I, Pilar JarvisSilas Kendarius Vigen, the attending physician, personally viewed and interpreted this ECG.   Date: 07/30/2022  EKG Time: 1458  Rate: 73  Rhythm: sinus  Axis: nl  Intervals:LPFB  ST&T Change: no stemi    RADIOLOGY I independently reviewed and interpreted CT scan of the head see no obvious bleeding or midline shift   PROCEDURES:  Critical Care performed: No  Procedures  MEDICATIONS ORDERED IN ED: Medications - No data to display   IMPRESSION / MDM / ASSESSMENT AND PLAN / ED COURSE  I reviewed the triage vital signs and the nursing notes.                                Patient's presentation is most consistent with acute presentation with potential threat to life or bodily function.  Differential diagnosis includes, but is not limited to, mechanical fall leading to blunt traumatic injury including intracranial bleeding, other mechanism fall including syncope, cardiogenic syncope, dysrhythmia, infection, metabolic derangements   MDM: With a pretty clear story for mechanical slip and fall reaching down lost her balance and denies any presyncopal symptoms infectious symptoms or preceding illnesses to account for other causes of her fall.  Focus on traumatic injuries which include head strike and no loss of consciousness patient with a history of Xarelto use for atrial fibrillation check CT  scan of the head for intracranial bleeding and fortunately is negative.    I considered hospitalization for admission or observation ever given negative traumatic imaging and patient is comfortable in the emergency department with no other acute findings if injury and no other acute medical complaints, I think outpatient follow-up and monitoring most appropriate at this time.        FINAL CLINICAL IMPRESSION(S) / ED DIAGNOSES   Final diagnoses:  Fall, initial encounter  Injury of head, initial encounter     Rx / DC Orders   ED Discharge Orders     None        Note:  This document was prepared using Dragon voice recognition software and may include unintentional dictation errors.    Pilar Jarvis, MD 07/30/22 [phone]

## 2022-07-30 NOTE — ED Notes (Signed)
Called [facility] for update, pt is on the list for transport no eta att.

## 2022-07-30 NOTE — Discharge Instructions (Signed)
Thank you for choosing us for your health care today!  Please see your primary doctor this week for a follow up appointment.   Sometimes, in the early stages of certain disease courses it is difficult to detect in the emergency department evaluation -- so, it is important that you continue to monitor your symptoms and call your doctor right away or return to the emergency department if you develop any new or worsening symptoms.  Please go to the following website to schedule new (and existing) patient appointments:   [email or web address]  If you do not have a primary doctor try calling the following clinics to establish care:  If you have insurance:  [facility] [phone] [address] [place] [address]   [facility]  [phone] [address] [place] [address] [place] [address]   If you do not have insurance:  [facility]  [phone] [address] [place] [address]   The following is another list of primary care offices in the area who are accepting new patients at this time.  Please reach out to one of them directly and let them know you would like to schedule an appointment to follow up on an Emergency Department visit, and/or to establish a new primary care provider (PCP).  There are likely other primary care clinics in the are who are accepting new patients, but this is an excellent place to start:  [facility] Lead physician: Dr Angela Bacigalupo [address] [place], [place] [address] [phone]  [facility] Lead Physician: Dr Krichna Sowles [address], [place], [place] [address] [phone]  [facility]  Lead Physician: Dr Megan Johnson [address], [place], [place] [address] [phone]  [facility] [place] [facility] Lead Physician: Dr Alex Karamalegos [address], [place], [place] [address] [phone]  [place] [facility] [place] Lead Physician: Dr Laura Berglund [address], [place],  [address] [phone]   It was my pleasure to care for you today.   Virginia Curl S. Shamekia Tippets, MD  

## 2022-07-30 NOTE — ED Notes (Signed)
Per EMS report, Patient lives at [facility] and had a witness fall by her closet. Patient c/o back of  head pain and left leg pain. No LOC and is not on blood thinners. Patient was ambulatory at scene.  155/83 75 pulse 97% on 3L baseline O2

## 2022-07-30 NOTE — ED Notes (Signed)
[facility] arrives to transport patient home.

## 2022-08-11 DIAGNOSIS — M1990 Unspecified osteoarthritis, unspecified site: Secondary | ICD-10-CM | POA: Diagnosis not present

## 2022-08-11 DIAGNOSIS — G609 Hereditary and idiopathic neuropathy, unspecified: Secondary | ICD-10-CM | POA: Diagnosis not present

## 2022-09-01 DIAGNOSIS — K219 Gastro-esophageal reflux disease without esophagitis: Secondary | ICD-10-CM | POA: Diagnosis not present

## 2022-09-01 DIAGNOSIS — I110 Hypertensive heart disease with heart failure: Secondary | ICD-10-CM | POA: Diagnosis not present

## 2022-09-01 DIAGNOSIS — N183 Chronic kidney disease, stage 3 unspecified: Secondary | ICD-10-CM | POA: Diagnosis not present

## 2022-09-01 DIAGNOSIS — E559 Vitamin D deficiency, unspecified: Secondary | ICD-10-CM | POA: Diagnosis not present

## 2022-09-01 DIAGNOSIS — I10 Essential (primary) hypertension: Secondary | ICD-10-CM | POA: Diagnosis not present

## 2022-09-07 DIAGNOSIS — E119 Type 2 diabetes mellitus without complications: Secondary | ICD-10-CM | POA: Diagnosis not present

## 2022-09-07 DIAGNOSIS — I10 Essential (primary) hypertension: Secondary | ICD-10-CM | POA: Diagnosis not present

## 2022-12-13 ENCOUNTER — Telehealth: Payer: Self-pay | Admitting: Internal Medicine

## 2022-12-13 NOTE — Telephone Encounter (Signed)
Copied from CRM [id number]. Topic: Medicare AWV >> Dec 13, 2022  3:02 PM Payton Doughty wrote: Reason for CRM: LM 12/13/2022 to schedule AWV   Verlee Rossetti; Care Guide Ambulatory Clinical Support [place] [facility] Direct Dial: [phone]

## 2022-12-26 DEATH — deceased
